# Patient Record
Sex: Female | Born: 1950 | Race: White | Hispanic: No | Smoking: Former smoker
Health system: Southern US, Community
[De-identification: ages and names within clinical notes are randomized; demographics above are authoritative.]

## PROBLEM LIST (undated history)

## (undated) DIAGNOSIS — K2970 Gastritis, unspecified, without bleeding: Secondary | ICD-10-CM

## (undated) DIAGNOSIS — M1990 Unspecified osteoarthritis, unspecified site: Secondary | ICD-10-CM

## (undated) DIAGNOSIS — K2270 Barrett's esophagus without dysplasia: Secondary | ICD-10-CM

## (undated) DIAGNOSIS — K219 Gastro-esophageal reflux disease without esophagitis: Secondary | ICD-10-CM

## (undated) DIAGNOSIS — K279 Peptic ulcer, site unspecified, unspecified as acute or chronic, without hemorrhage or perforation: Secondary | ICD-10-CM

## (undated) DIAGNOSIS — F329 Major depressive disorder, single episode, unspecified: Secondary | ICD-10-CM

## (undated) DIAGNOSIS — F419 Anxiety disorder, unspecified: Secondary | ICD-10-CM

## (undated) DIAGNOSIS — I10 Essential (primary) hypertension: Secondary | ICD-10-CM

## (undated) DIAGNOSIS — K56609 Unspecified intestinal obstruction, unspecified as to partial versus complete obstruction: Secondary | ICD-10-CM

## (undated) DIAGNOSIS — Z8719 Personal history of other diseases of the digestive system: Secondary | ICD-10-CM

## (undated) DIAGNOSIS — K7290 Hepatic failure, unspecified without coma: Secondary | ICD-10-CM

## (undated) DIAGNOSIS — K7689 Other specified diseases of liver: Secondary | ICD-10-CM

## (undated) DIAGNOSIS — G43909 Migraine, unspecified, not intractable, without status migrainosus: Secondary | ICD-10-CM

## (undated) DIAGNOSIS — Z96651 Presence of right artificial knee joint: Secondary | ICD-10-CM

## (undated) DIAGNOSIS — J449 Chronic obstructive pulmonary disease, unspecified: Secondary | ICD-10-CM

## (undated) DIAGNOSIS — F988 Other specified behavioral and emotional disorders with onset usually occurring in childhood and adolescence: Secondary | ICD-10-CM

## (undated) DIAGNOSIS — G4700 Insomnia, unspecified: Secondary | ICD-10-CM

## (undated) DIAGNOSIS — F32A Depression, unspecified: Secondary | ICD-10-CM

## (undated) DIAGNOSIS — F909 Attention-deficit hyperactivity disorder, unspecified type: Secondary | ICD-10-CM

## (undated) DIAGNOSIS — D649 Anemia, unspecified: Secondary | ICD-10-CM

## (undated) DIAGNOSIS — Z8659 Personal history of other mental and behavioral disorders: Secondary | ICD-10-CM

## (undated) DIAGNOSIS — K648 Other hemorrhoids: Secondary | ICD-10-CM

## (undated) DIAGNOSIS — Z8711 Personal history of peptic ulcer disease: Secondary | ICD-10-CM

## (undated) DIAGNOSIS — E2839 Other primary ovarian failure: Secondary | ICD-10-CM

## (undated) HISTORY — DX: Personal history of other diseases of the digestive system: Z87.19

## (undated) HISTORY — DX: Depression, unspecified: F32.A

## (undated) HISTORY — PX: ABDOMINAL HYSTERECTOMY: SHX81

## (undated) HISTORY — DX: Peptic ulcer, site unspecified, unspecified as acute or chronic, without hemorrhage or perforation: K27.9

## (undated) HISTORY — DX: Major depressive disorder, single episode, unspecified: F32.9

## (undated) HISTORY — DX: Hepatic failure, unspecified without coma: K72.90

## (undated) HISTORY — DX: Other hemorrhoids: K64.8

## (undated) HISTORY — DX: Insomnia, unspecified: G47.00

## (undated) HISTORY — DX: Chronic obstructive pulmonary disease, unspecified: J44.9

## (undated) HISTORY — DX: Barrett's esophagus without dysplasia: K22.70

## (undated) HISTORY — DX: Unspecified intestinal obstruction, unspecified as to partial versus complete obstruction: K56.609

## (undated) HISTORY — DX: Attention-deficit hyperactivity disorder, unspecified type: F90.9

## (undated) HISTORY — PX: OOPHORECTOMY: SHX6387

## (undated) HISTORY — PX: DILATION AND CURETTAGE OF UTERUS: SHX78

## (undated) HISTORY — DX: Gastro-esophageal reflux disease without esophagitis: K21.9

## (undated) HISTORY — DX: Personal history of other mental and behavioral disorders: Z86.59

## (undated) HISTORY — PX: LIVER BIOPSY: SHX301

## (undated) HISTORY — DX: Anemia, unspecified: D64.9

## (undated) HISTORY — DX: Essential (primary) hypertension: I10

## (undated) HISTORY — DX: Migraine, unspecified, not intractable, without status migrainosus: G43.909

## (undated) HISTORY — DX: Presence of right artificial knee joint: Z96.651

## (undated) HISTORY — DX: Gastritis, unspecified, without bleeding: K29.70

## (undated) HISTORY — DX: Other primary ovarian failure: E28.39

## (undated) HISTORY — DX: Personal history of peptic ulcer disease: Z87.11

## (undated) HISTORY — DX: Other specified behavioral and emotional disorders with onset usually occurring in childhood and adolescence: F98.8

## (undated) HISTORY — DX: Other specified diseases of liver: K76.89

---

## 1999-01-18 ENCOUNTER — Other Ambulatory Visit: Admission: RE | Admit: 1999-01-18 | Discharge: 1999-01-18 | Payer: Self-pay | Admitting: *Deleted

## 2000-05-11 ENCOUNTER — Inpatient Hospital Stay (HOSPITAL_COMMUNITY): Admission: AD | Admit: 2000-05-11 | Discharge: 2000-05-11 | Payer: Self-pay | Admitting: *Deleted

## 2000-08-19 ENCOUNTER — Inpatient Hospital Stay (HOSPITAL_COMMUNITY): Admission: RE | Admit: 2000-08-19 | Discharge: 2000-08-22 | Payer: Self-pay | Admitting: *Deleted

## 2000-08-19 ENCOUNTER — Encounter (INDEPENDENT_AMBULATORY_CARE_PROVIDER_SITE_OTHER): Payer: Self-pay | Admitting: Specialist

## 2001-05-15 ENCOUNTER — Observation Stay (HOSPITAL_COMMUNITY): Admission: EM | Admit: 2001-05-15 | Discharge: 2001-05-16 | Payer: Self-pay | Admitting: Gastroenterology

## 2001-05-16 ENCOUNTER — Encounter: Payer: Self-pay | Admitting: Internal Medicine

## 2002-09-17 ENCOUNTER — Other Ambulatory Visit: Admission: RE | Admit: 2002-09-17 | Discharge: 2002-09-17 | Payer: Self-pay | Admitting: *Deleted

## 2003-08-19 ENCOUNTER — Other Ambulatory Visit: Admission: RE | Admit: 2003-08-19 | Discharge: 2003-08-19 | Payer: Self-pay | Admitting: *Deleted

## 2004-01-12 ENCOUNTER — Ambulatory Visit (HOSPITAL_COMMUNITY): Admission: RE | Admit: 2004-01-12 | Discharge: 2004-01-12 | Payer: Self-pay | Admitting: Internal Medicine

## 2004-01-12 ENCOUNTER — Encounter (INDEPENDENT_AMBULATORY_CARE_PROVIDER_SITE_OTHER): Payer: Self-pay | Admitting: Specialist

## 2004-01-12 ENCOUNTER — Encounter: Payer: Self-pay | Admitting: Internal Medicine

## 2004-09-20 ENCOUNTER — Ambulatory Visit: Payer: Self-pay | Admitting: Internal Medicine

## 2004-10-13 ENCOUNTER — Emergency Department: Payer: Self-pay | Admitting: Emergency Medicine

## 2004-10-13 ENCOUNTER — Other Ambulatory Visit: Payer: Self-pay

## 2005-08-04 ENCOUNTER — Emergency Department: Payer: Self-pay | Admitting: Emergency Medicine

## 2005-08-04 ENCOUNTER — Other Ambulatory Visit: Payer: Self-pay

## 2005-08-06 ENCOUNTER — Ambulatory Visit: Payer: Self-pay | Admitting: Emergency Medicine

## 2005-09-21 ENCOUNTER — Inpatient Hospital Stay (HOSPITAL_COMMUNITY): Admission: EM | Admit: 2005-09-21 | Discharge: 2005-09-25 | Payer: Self-pay | Admitting: Emergency Medicine

## 2005-09-21 ENCOUNTER — Ambulatory Visit: Payer: Self-pay | Admitting: Internal Medicine

## 2006-01-14 ENCOUNTER — Ambulatory Visit: Payer: Self-pay | Admitting: Internal Medicine

## 2006-07-15 ENCOUNTER — Ambulatory Visit: Payer: Self-pay | Admitting: Internal Medicine

## 2006-07-26 ENCOUNTER — Ambulatory Visit: Payer: Self-pay | Admitting: Urology

## 2007-02-03 ENCOUNTER — Emergency Department: Payer: Self-pay | Admitting: Emergency Medicine

## 2007-05-03 ENCOUNTER — Ambulatory Visit: Payer: Self-pay | Admitting: Orthopaedic Surgery

## 2008-04-07 ENCOUNTER — Ambulatory Visit: Payer: Self-pay | Admitting: Family Medicine

## 2008-04-21 DIAGNOSIS — Z8659 Personal history of other mental and behavioral disorders: Secondary | ICD-10-CM

## 2008-04-21 DIAGNOSIS — Z8719 Personal history of other diseases of the digestive system: Secondary | ICD-10-CM

## 2008-04-21 DIAGNOSIS — K648 Other hemorrhoids: Secondary | ICD-10-CM | POA: Insufficient documentation

## 2008-04-21 DIAGNOSIS — K219 Gastro-esophageal reflux disease without esophagitis: Secondary | ICD-10-CM

## 2008-04-21 DIAGNOSIS — Z8711 Personal history of peptic ulcer disease: Secondary | ICD-10-CM

## 2008-04-21 DIAGNOSIS — Z862 Personal history of diseases of the blood and blood-forming organs and certain disorders involving the immune mechanism: Secondary | ICD-10-CM | POA: Insufficient documentation

## 2008-04-21 DIAGNOSIS — K2270 Barrett's esophagus without dysplasia: Secondary | ICD-10-CM | POA: Insufficient documentation

## 2008-04-21 DIAGNOSIS — I10 Essential (primary) hypertension: Secondary | ICD-10-CM

## 2008-04-21 DIAGNOSIS — G43909 Migraine, unspecified, not intractable, without status migrainosus: Secondary | ICD-10-CM

## 2008-04-21 DIAGNOSIS — J449 Chronic obstructive pulmonary disease, unspecified: Secondary | ICD-10-CM | POA: Insufficient documentation

## 2008-04-21 DIAGNOSIS — K7200 Acute and subacute hepatic failure without coma: Secondary | ICD-10-CM

## 2008-04-21 HISTORY — DX: Personal history of peptic ulcer disease: Z87.11

## 2008-04-21 HISTORY — DX: Personal history of other mental and behavioral disorders: Z86.59

## 2008-04-21 HISTORY — DX: Personal history of other diseases of the digestive system: Z87.19

## 2008-04-22 ENCOUNTER — Ambulatory Visit: Payer: Self-pay | Admitting: Internal Medicine

## 2008-04-22 DIAGNOSIS — K5900 Constipation, unspecified: Secondary | ICD-10-CM | POA: Insufficient documentation

## 2008-04-22 LAB — CONVERTED CEMR LAB
ANA Titer 1: 1:160 {titer} — ABNORMAL HIGH
Anti Nuclear Antibody(ANA): POSITIVE — AB

## 2008-04-26 ENCOUNTER — Telehealth: Payer: Self-pay | Admitting: Internal Medicine

## 2008-04-26 LAB — CONVERTED CEMR LAB
ALT: 19 units/L (ref 0–35)
AST: 19 units/L (ref 0–37)
Albumin: 3.8 g/dL (ref 3.5–5.2)
Alkaline Phosphatase: 64 units/L (ref 39–117)
BUN: 17 mg/dL (ref 6–23)
Basophils Absolute: 0.1 10*3/uL (ref 0.0–0.1)
Basophils Relative: 0.8 % (ref 0.0–1.0)
Bilirubin, Direct: 0.1 mg/dL (ref 0.0–0.3)
CO2: 25 meq/L (ref 19–32)
Calcium: 8.8 mg/dL (ref 8.4–10.5)
Chloride: 107 meq/L (ref 96–112)
Creatinine, Ser: 0.8 mg/dL (ref 0.4–1.2)
Eosinophils Absolute: 0.4 10*3/uL (ref 0.0–0.7)
Eosinophils Relative: 5.4 % — ABNORMAL HIGH (ref 0.0–5.0)
GFR calc Af Amer: 96 mL/min
GFR calc non Af Amer: 79 mL/min
Glucose, Bld: 84 mg/dL (ref 70–99)
HCT: 42.2 % (ref 36.0–46.0)
Hemoglobin: 14.6 g/dL (ref 12.0–15.0)
Iron: 50 ug/dL (ref 42–145)
Lymphocytes Relative: 23.1 % (ref 12.0–46.0)
MCHC: 34.7 g/dL (ref 30.0–36.0)
MCV: 96 fL (ref 78.0–100.0)
Monocytes Absolute: 0.5 10*3/uL (ref 0.1–1.0)
Monocytes Relative: 6.8 % (ref 3.0–12.0)
Neutro Abs: 4.5 10*3/uL (ref 1.4–7.7)
Neutrophils Relative %: 63.9 % (ref 43.0–77.0)
Platelets: 333 10*3/uL (ref 150–400)
Potassium: 3.6 meq/L (ref 3.5–5.1)
RBC: 4.4 M/uL (ref 3.87–5.11)
RDW: 12.9 % (ref 11.5–14.6)
Saturation Ratios: 15.2 % — ABNORMAL LOW (ref 20.0–50.0)
Sodium: 142 meq/L (ref 135–145)
TSH: 2.41 microintl units/mL (ref 0.35–5.50)
Total Bilirubin: 0.6 mg/dL (ref 0.3–1.2)
Total Protein: 6.9 g/dL (ref 6.0–8.3)
Transferrin: 235 mg/dL (ref >=212.0)
WBC: 7.1 10*3/uL (ref 4.5–10.5)

## 2008-05-05 ENCOUNTER — Telehealth: Payer: Self-pay | Admitting: Internal Medicine

## 2008-05-12 ENCOUNTER — Ambulatory Visit: Payer: Self-pay | Admitting: Internal Medicine

## 2008-05-13 LAB — CONVERTED CEMR LAB
Fecal Occult Blood: NEGATIVE
OCCULT 1: NEGATIVE
OCCULT 2: NEGATIVE
OCCULT 3: NEGATIVE
OCCULT 4: NEGATIVE
OCCULT 5: NEGATIVE

## 2008-06-21 ENCOUNTER — Telehealth: Payer: Self-pay | Admitting: Internal Medicine

## 2008-07-29 ENCOUNTER — Telehealth: Payer: Self-pay | Admitting: Internal Medicine

## 2008-09-06 ENCOUNTER — Telehealth: Payer: Self-pay | Admitting: Internal Medicine

## 2008-10-06 ENCOUNTER — Telehealth: Payer: Self-pay | Admitting: Internal Medicine

## 2008-10-07 ENCOUNTER — Telehealth: Payer: Self-pay | Admitting: Internal Medicine

## 2008-10-15 ENCOUNTER — Encounter: Payer: Self-pay | Admitting: Internal Medicine

## 2008-10-15 ENCOUNTER — Ambulatory Visit: Payer: Self-pay | Admitting: Internal Medicine

## 2008-10-19 ENCOUNTER — Encounter: Payer: Self-pay | Admitting: Internal Medicine

## 2008-10-20 ENCOUNTER — Telehealth: Payer: Self-pay | Admitting: Internal Medicine

## 2008-12-13 ENCOUNTER — Telehealth: Payer: Self-pay | Admitting: Internal Medicine

## 2009-02-09 ENCOUNTER — Telehealth: Payer: Self-pay | Admitting: Internal Medicine

## 2009-03-01 ENCOUNTER — Encounter: Payer: Self-pay | Admitting: Internal Medicine

## 2009-03-17 ENCOUNTER — Ambulatory Visit: Payer: Self-pay | Admitting: Internal Medicine

## 2009-07-13 ENCOUNTER — Telehealth: Payer: Self-pay | Admitting: Internal Medicine

## 2009-08-30 ENCOUNTER — Telehealth: Payer: Self-pay | Admitting: Internal Medicine

## 2009-09-08 ENCOUNTER — Telehealth: Payer: Self-pay | Admitting: Internal Medicine

## 2009-10-13 ENCOUNTER — Telehealth: Payer: Self-pay | Admitting: Internal Medicine

## 2009-11-30 ENCOUNTER — Ambulatory Visit: Payer: Self-pay | Admitting: Internal Medicine

## 2009-11-30 LAB — CONVERTED CEMR LAB
ANA Titer 1: 1:640 {titer} — ABNORMAL HIGH
Anti Nuclear Antibody(ANA): POSITIVE — AB

## 2009-12-01 ENCOUNTER — Telehealth: Payer: Self-pay | Admitting: Internal Medicine

## 2009-12-01 LAB — CONVERTED CEMR LAB
ALT: 16 units/L (ref 0–35)
AST: 24 units/L (ref 0–37)
Albumin: 4.1 g/dL (ref 3.5–5.2)
Alkaline Phosphatase: 68 units/L (ref 39–117)
BUN: 14 mg/dL (ref 6–23)
Basophils Absolute: 0 10*3/uL (ref 0.0–0.1)
Basophils Relative: 0.1 % (ref 0.0–3.0)
CO2: 28 meq/L (ref 19–32)
Calcium: 9.3 mg/dL (ref 8.4–10.5)
Chloride: 107 meq/L (ref 96–112)
Creatinine, Ser: 0.7 mg/dL (ref 0.4–1.2)
Eosinophils Absolute: 0.3 10*3/uL (ref 0.0–0.7)
Eosinophils Relative: 5.9 % — ABNORMAL HIGH (ref 0.0–5.0)
GFR calc non Af Amer: 91.83 mL/min (ref >60)
Glucose, Bld: 90 mg/dL (ref 70–99)
HCT: 45.7 % (ref 36.0–46.0)
Hemoglobin: 15 g/dL (ref 12.0–15.0)
Lymphocytes Relative: 26.7 % (ref 12.0–46.0)
Lymphs Abs: 1.3 10*3/uL (ref 0.7–4.0)
MCHC: 32.7 g/dL (ref 30.0–36.0)
MCV: 99.5 fL (ref 78.0–100.0)
Monocytes Absolute: 0.3 10*3/uL (ref 0.1–1.0)
Monocytes Relative: 5.2 % (ref 3.0–12.0)
Neutro Abs: 3.1 10*3/uL (ref 1.4–7.7)
Neutrophils Relative %: 62.1 % (ref 43.0–77.0)
Platelets: 285 10*3/uL (ref 150.0–400.0)
Potassium: 4.8 meq/L (ref 3.5–5.1)
RBC: 4.59 M/uL (ref 3.87–5.11)
RDW: 13.1 % (ref 11.5–14.6)
Sed Rate: 7 mm/hr (ref 0–22)
Sodium: 142 meq/L (ref 135–145)
TSH: 1.5 microintl units/mL (ref 0.35–5.50)
Total Bilirubin: 0.5 mg/dL (ref 0.3–1.2)
Total Protein: 7.6 g/dL (ref 6.0–8.3)
WBC: 5 10*3/uL (ref 4.5–10.5)

## 2010-02-17 ENCOUNTER — Ambulatory Visit: Payer: Self-pay | Admitting: Family Medicine

## 2010-03-16 ENCOUNTER — Other Ambulatory Visit: Payer: Self-pay | Admitting: Family Medicine

## 2010-03-16 DIAGNOSIS — F900 Attention-deficit hyperactivity disorder, predominantly inattentive type: Secondary | ICD-10-CM | POA: Insufficient documentation

## 2010-05-02 ENCOUNTER — Ambulatory Visit: Payer: Self-pay | Admitting: Internal Medicine

## 2010-05-02 ENCOUNTER — Telehealth: Payer: Self-pay | Admitting: Internal Medicine

## 2010-05-03 LAB — CONVERTED CEMR LAB
ALT: 19 units/L (ref 0–35)
AST: 24 units/L (ref 0–37)
Albumin: 4.1 g/dL (ref 3.5–5.2)
Alkaline Phosphatase: 72 units/L (ref 39–117)
BUN: 17 mg/dL (ref 6–23)
Basophils Absolute: 0 10*3/uL (ref 0.0–0.1)
Basophils Relative: 0.6 % (ref 0.0–3.0)
CO2: 31 meq/L (ref 19–32)
Calcium: 9.3 mg/dL (ref 8.4–10.5)
Chloride: 106 meq/L (ref 96–112)
Creatinine, Ser: 0.5 mg/dL (ref 0.4–1.2)
Eosinophils Absolute: 0.1 10*3/uL (ref 0.0–0.7)
Eosinophils Relative: 2.6 % (ref 0.0–5.0)
GFR calc non Af Amer: 132.15 mL/min (ref >60)
Glucose, Bld: 89 mg/dL (ref 70–99)
HCT: 41.2 % (ref 36.0–46.0)
Hemoglobin: 14.3 g/dL (ref 12.0–15.0)
Lymphocytes Relative: 23.2 % (ref 12.0–46.0)
Lymphs Abs: 1.2 10*3/uL (ref 0.7–4.0)
MCHC: 34.6 g/dL (ref 30.0–36.0)
MCV: 96.8 fL (ref 78.0–100.0)
Monocytes Absolute: 0.4 10*3/uL (ref 0.1–1.0)
Monocytes Relative: 7.5 % (ref 3.0–12.0)
Neutro Abs: 3.3 10*3/uL (ref 1.4–7.7)
Neutrophils Relative %: 66.1 % (ref 43.0–77.0)
Platelets: 271 10*3/uL (ref 150.0–400.0)
Potassium: 5 meq/L (ref 3.5–5.1)
RBC: 4.26 M/uL (ref 3.87–5.11)
RDW: 13.8 % (ref 11.5–14.6)
Sodium: 144 meq/L (ref 135–145)
Total Bilirubin: 0.5 mg/dL (ref 0.3–1.2)
Total Protein: 7.5 g/dL (ref 6.0–8.3)
WBC: 5 10*3/uL (ref 4.5–10.5)

## 2010-05-25 ENCOUNTER — Ambulatory Visit: Payer: Self-pay | Admitting: Family Medicine

## 2010-08-02 ENCOUNTER — Other Ambulatory Visit: Payer: Self-pay | Admitting: Family Medicine

## 2010-12-05 NOTE — Progress Notes (Signed)
Summary: Med for knee pain  Medications Added VOLTAREN 1 % GEL (DICLOFENAC SODIUM) Apply to knee 2 times daily as needed       Phone Note Call from Patient   Caller: Patient Call For: Dr.Feliciano Wynter Summary of Call: Pt. wants Dr.Hasina Kreager to write her an order for Voltaren Gel for her knee pain, states she discussed this with Dr.Neema Fluegge at her appt. yesterday.  DR.Felita Bump PLEASE ADVISE  Initial call taken by: Laureen Ochs LPN,  December 01, 2009 9:07 AM  Follow-up for Phone Call        Yes, I forgot to prescribe it. Voltaren gel 1%, 1 tube, aply two times a day to affected area, 1 refill Follow-up by: Hart Carwin MD,  December 01, 2009 1:09 PM  Additional Follow-up for Phone Call Additional follow up Details #1::        Message left for pt.-Script has been sent to her pharmacy. Pt. instructed to call back as needed.  Additional Follow-up by: Laureen Ochs LPN,  December 01, 2009 1:39 PM    New/Updated Medications: VOLTAREN 1 % GEL (DICLOFENAC SODIUM) Apply to knee 2 times daily as needed Prescriptions: VOLTAREN 1 % GEL (DICLOFENAC SODIUM) Apply to knee 2 times daily as needed  #1 tube x 1   Entered by:   Laureen Ochs LPN   Authorized by:   Hart Carwin MD   Signed by:   Laureen Ochs LPN on 81/11/7508   Method used:   Electronically to        [facility] [phone]* (retail)       [address] [place], [place]  [address]       Ph: [phone] or [phone]       Fax: [phone]   RxID:   [id number]

## 2010-12-05 NOTE — Progress Notes (Signed)
Summary: Fastin   ---- Converted from flag ----  ---- 05/01/2010 8:33 AM, Lamona Curl CMA (AAMA) wrote: Dr Cresenciano Genre requests refills on her fastin....she has now been taking the medication for at least 6 months...is she okay to get refills as long as she is losing weight or do you need to see her in the office or get labs? ------------------------------  ---- 05/01/2010 5:39 PM, Hart Carwin MD wrote: She ought to get CBC, C-met, and record her weight, please. Refill for 6 months.   Phone Note Outgoing Call   Call placed by: Lamona Curl CMA Duncan Dull),  May 02, 2010 8:53 AM Call placed to: Patient Summary of Call: I have left a message for patient to call back. I have also entered labs into [facility] and will advise patient of this when she calls back. Initial call taken by: Lamona Curl CMA Duncan Dull),  May 02, 2010 8:54 AM  Follow-up for Phone Call        Patient will come in for labs this week. She will also call us with a recorded weight. I have sent a 6 month refill for patient. Follow-up by: Lamona Curl CMA Duncan Dull),  May 02, 2010 10:44 AM    Prescriptions: FASTIN 30 MG Take 1 tablet by mouth once a day.  #30 x 5   Entered by:   Lamona Curl CMA (AAMA)   Authorized by:   Hart Carwin MD   Signed by:   Lamona Curl CMA (AAMA) on 05/02/2010   Method used:   Printed then faxed to ...       [facility] [phone]* (retail)       [address] [place], [place]  [address]       Ph: [phone] or [phone]       Fax: [phone]   RxID:   [id number]

## 2010-12-05 NOTE — Assessment & Plan Note (Signed)
Summary: MEDICATION REFILL/F/U--CH.    History of Present Illness Visit Type: Follow-up Visit Primary GI MD: Lina Sar MD Primary Provider: Dennison Mascot, M.D. Requesting Provider: Dennison Mascot, MD Chief Complaint: Pt needs a refill on Fastin and wants to discuss ECL. Pt denies any other sx. History of Present Illness:   This is a 60 year old white female with a history of autoimmune liver disease and liver failure. She was also found to have Barrett's esophagus on an upper endoscopy in 2005 and again in 2009. She is due for a colonoscopy in July 2012. Patient is also due for an endoscopy in July 2002. She comes today for a routine follow up and refills of her medications. She denies any specific GI symptoms other than severe constipation for which she takes MiraLax 17 g daily. Patient would like refills on Fastin with which she has successfully been losing weight.   GI Review of Systems      Denies abdominal pain, acid reflux, belching, bloating, chest pain, dysphagia with liquids, dysphagia with solids, heartburn, loss of appetite, nausea, vomiting, vomiting blood, weight loss, and  weight gain.        Denies anal fissure, black tarry stools, change in bowel habit, constipation, diarrhea, diverticulosis, fecal incontinence, heme positive stool, hemorrhoids, irritable bowel syndrome, jaundice, light color stool, liver problems, rectal bleeding, and  rectal pain.    Current Medications (verified): 1)  Protonix 40 Mg  Tbec (Pantoprazole Sodium) .... Take 1 Tablet By Mouth Two Times A Day 2)  Atenolol 50 Mg  Tabs (Atenolol) .Marland Kitchen.. 1 Tablet Once Daily 3)  Ambien 5 Mg  Tabs (Zolpidem Tartrate) .Marland Kitchen.. 1 Tablet At Bedtime 4)  Miralax   Powd (Polyethylene Glycol 3350) .... Take 1 Capful Dissolved in Water Once Daily. 5)  Fastin 30 Mg .... Take 1 Tablet By Mouth Once A Day. 6)  Ferrous Sulfate 325 (65 Fe) Mg  Tabs (Ferrous Sulfate) .... Take One Every Night At Bedtime 7)  Imitrex 100 Mg Tabs  (Sumatriptan Succinate) .... 1/2 Tablet As Needed For Migrains  Allergies (verified): No Known Drug Allergies  Past History:  Past Medical History: Reviewed history from 04/21/2008 and no changes required. 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 D & C hysterectomy  unilateral oophorectomy  Family History: Reviewed history from 04/22/2008 and no changes required. Family History of Heart Disease: CHF father No FH of Colon Cancer:  Social History: Reviewed history from 04/22/2008 and no changes required. Daily Caffeine Use-1 drink daily Illicit Drug Use - no Patient is a former smoker. -stopped 1990 Alcohol Use - yes-1 glass of wine on weekends  Review of Systems  The patient denies allergy/sinus, anemia, anxiety-new, arthritis/joint pain, back pain, blood in urine, breast changes/lumps, change in vision, confusion, cough, coughing up blood, depression-new, fainting, fatigue, fever, headaches-new, hearing problems, heart murmur, heart rhythm changes, itching, menstrual pain, muscle pains/cramps, night sweats, nosebleeds, pregnancy symptoms, shortness of breath, skin rash, sleeping problems, sore throat, swelling of feet/legs, swollen lymph glands, thirst - excessive , urination - excessive , urination changes/pain, urine leakage, vision changes, and voice change.         Pertinent positive and negative review of systems were noted  in the above HPI. All other ROS was otherwise negative.   Vital Signs:  Patient profile:   60 year old female Height:      64  inches Weight:      143.50 pounds BMI:     24.72 Pulse rate:   66 / minute Pulse rhythm:   regular BP sitting:   146 / 78  (right arm) Cuff size:   regular  Vitals Entered By: Christie Nottingham CMA Duncan Dull) (November 30, 2009 9:49 AM)  Physical Exam  General:  Well developed, well nourished, no acute distress. Eyes:  PERRLA, no icterus. Mouth:  No deformity or lesions, dentition normal. Neck:  Supple; no masses or thyromegaly. Lungs:  Clear throughout to auscultation. Heart:  Regular rate and rhythm; no murmurs, rubs,  or bruits. Abdomen:  Soft, nontender and nondistended. No masses, hepatosplenomegaly or hernias noted. Normal bowel sounds. Extremities:  No clubbing, cyanosis, edema or deformities noted. Skin:  Intact without significant lesions or rashes. Psych:  Alert and cooperative. Normal mood and affect.   Impression & Recommendations:  Problem # 1:  Hx of HEPATIC FAILURE (ICD-570) history of fulminant  hepatic failure in 1990 , s/p  complete resolution. Repeat liver function tests today as well as ANA titer, CBC and sedimentation rate  Problem # 2:  DEPRESSION, HX OF (ICD-V11.8) refill Fastin  30 mg daily,  suspect ADHD disorder;  patient seems to be better focused when taking  medication  Problem # 3:  BARRETTS ESOPHAGUS (ICD-530.85) recall upper endoscopy July 2012 Orders: T-ANA ([address]-60454) TLB-CBC Platelet - w/Differential (85025-CBCD) TLB-CMP (Comprehensive Metabolic Pnl) (80053-COMP) TLB-TSH (Thyroid Stimulating Hormone) (84443-TSH) TLB-Sedimentation Rate (ESR) (85652-ESR)  Problem # 4:  INTERNAL HEMORRHOIDS (ICD-455.0) not symptomatic. Recall colonoscopy July 2012  Patient Instructions: 1)  refill Fastin 30 mg p.o. q.d. 2)  Continue MiraLax p.r.n. 3)  Recall upper endoscopy and colonoscopy July 2012 4)  Labs today 5)  Copy sent to : Dr Dennison Mascot Prescriptions: FASTIN 30 MG Take 1 tablet by mouth once a day.  #30 x 6   Entered by:   Hortense Ramal CMA  (AAMA)   Authorized by:   Hart Carwin MD   Signed by:   Hortense Ramal CMA (AAMA) on 11/30/2009   Method used:   Handwritten   RxID:   [id number]

## 2011-03-23 NOTE — Discharge Summary (Signed)
NAMESOPHEAP, BASIC               ACCOUNT NO.:  [id number]   MEDICAL RECORD NO.:  [id number]          PATIENT TYPE:  INP   LOCATION:  1401                         FACILITY:  [facility]   PHYSICIAN:  Leonie Man, M.D.   DATE OF BIRTH:  09-04-1951   DATE OF ADMISSION:  09/20/2005  DATE OF DISCHARGE:  09/25/2005                                 DISCHARGE SUMMARY   ADMISSION DIAGNOSES:  1.  Partial small bowel obstruction.  2.  History of autoimmune liver failure.  3.  History of migraine headaches.  4.  Hypertension.  5.  Barrett's esophagus.  6.  Chronic constipation.   DISCHARGE DIAGNOSES:  1.  Partial small bowel obstruction.  2.  History of autoimmune liver failure.  3.  History of migraine headaches.  4.  Hypertension.  5.  Barrett's esophagus.  6.  Chronic constipation.   CONDITION ON DISCHARGE:  Improved.   MEDICATIONS ON DISCHARGE:  None   HISTORY OF PRESENT ILLNESS:  The patient is a 60 year old female admitted  with a 2-day history of severe cramping abdominal pain associated with some  nausea. Her pain persisted throughout the day but she did have a small bowel  movement on Wednesday and noted a small amount of blood in her stool. She  had not had any passing flatus since that time. She was admitted through the  emergency room and a KUB was originally read as normal but because of  persistent pain, she had a CT scan of the abdomen which showed dilated loops  of small bowel with bowel wall thickening with the possibility of an  inflammatory versus adhesive small bowel obstruction. The patient was  admitted and put on bowel rest. Over the ensuing 24 hours, she did begin to  feel better as far as her abdominal symptoms were concerned and she did pass  some small amounts of flatus. She, however developed a migraine headache  while in the hospital and her discharge was delayed because of this. Her  migraine resolved over the next at 24 hours and she then underwent a  small-  bowel series which was read as normal with no evidence of adhesions or  intussusception on the study. Her diet was resumed and she tolerated this  well and she was discharged from the hospital on 09/25/2005. Her follow-up  is scheduled for her to return to our office in approximately 3 weeks or  earlier if her symptoms recur.   ACTIVITY:  Is as tolerated.   DIET:  Is unrestricted.      Leonie Man, M.D.  Electronically Signed     PB/MEDQ  D:  11/01/2005  T:  11/01/2005  Job:  [id number]   cc:   Leonie Man, M.D.  [address]  [address]  [place]  [place] [address]

## 2011-03-23 NOTE — H&P (Signed)
NAME:  MITSUE, PEERY                         ACCOUNT NO.:  [id number]   MEDICAL RECORD NO.:  [id number]                   PATIENT TYPE:  AMB   LOCATION:  ENDO                                 FACILITY:  [facility]   PHYSICIAN:  Lina Sar, M.D. [facility]               DATE OF BIRTH:  27-Dec-1950   DATE OF ADMISSION:  01/12/2004  DATE OF DISCHARGE:                                HISTORY & PHYSICAL   Audio too short to transcribe (less than 5 seconds)                                                Lina Sar, M.D. [facility]    DB/MEDQ  D:  01/12/2004  T:  01/12/2004  Job:  [id number]
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
## 2011-03-23 NOTE — Assessment & Plan Note (Signed)
[facility]                           GASTROENTEROLOGY OFFICE NOTE   NAME:Cranfield, NIAYA HICKOK                      MRN:          [id number]  DATE:07/15/2006                            DOB:          1951-08-20    Ms. Wardle is a 60 year old white female with autoimmune disease positive  for ANA titer of 2500, history of Barrett's esophagus documented on last  upper endoscopy in March 2005.  She had fulminant hepatic failure in 1990,  from which she has fully recovered.  She also has a history of rectal  bleeding, acute lower GI bleeding in July 2005.  Colonoscopy at that time  showed internal hemorrhoids.  She is here today to recheck on her  medications and her blood tests.  Last appointment in March 2007   MEDICATIONS:  1. Protonix 40 mg p.o. b.i.d.  2. Spironolactone 25 mg p.o. daily.  3. Macrobid one p.o. b.i.d.  4. She is also continued with doxepin 75 mg p.o. daily.  5. Zomig 2.5 mg p.r.n.   PHYSICAL EXAMINATION:  VITAL SIGNS:  Blood pressure 124/82, pulse 60, weight  147 pounds.  GENERAL:  She was alert and oriented, in no distress.  LUNGS:  Clear to auscultation.  CARDIAC:  Normal S1, normal S2.  ABDOMEN:  Soft.  Liver edge at costal margin.  By percussion, liver span  about 9 cm.  Lower abdomen was normal.  No distention.  Normoactive bowel  sounds.  EXTREMITIES:  No edema.   IMPRESSION:  1. Stable gastroesophageal reflux disease in Barrett's esophagus.  2. Recent lower abdominal pain/urinary tract infection, being treated.  3. Patient on __________  for weight loss and decreased appetite.  4. Migraine headaches.   PLAN:  1. Refill on __________ , Zomig.  2. CBC, ANA titer, anti-DNA, CMET and sedimentation rate today.  3. Recall upper endoscopy for Barrett's esophagus next few months.                                   Hedwig Morton. Juanda Chance, MD   DMB/MedQ  DD:  07/15/2006  DT:  07/16/2006  Job #:  [id number]   cc:   Wonda Cheng

## 2011-03-23 NOTE — H&P (Signed)
[facility]  Patient:    Ashley [name], Ashley [name]                        MRN: [id number] Adm. Date:  05/15/01 Attending:  Barbette Hair. Arlyce Dice, M.D. [facility] Dictator:   Sammuel Cooper, P.A.                         History and Physical  DATE OF BIRTH:  [birth year]  CHIEF COMPLAINT:  Rectal bleeding, bright red blood onset today.  HISTORY OF PRESENT ILLNESS:  The patient is a very nice 60 year old white female followed by D.r Lina Sar, who has a history of irritable bowel with chronic constipation and an underlying collagen vascular disease, type unclear.  The patient had a history of fulminant hepatitis and hepatic failure in 1990 which was felt secondary to her autoimmune disease versus Tylenol. Apparently she had a prolonged hospitalization with severe liver failure requiring ventilator support, etc.  She recuperated from that fully and has been doing well.  She is otherwise in generally good health at this point. She had undergone a hysterectomy for fibroids in October 2001 and did well with that.  The patient does have chronic constipation which she manages with Senokot with good success.  She has not had prior colon evaluation as yet. She does have some difficulty with chronic arthralgias and has been using some Celebrex and Aleve on a p.r.n. basis.  At this time the patient had acute onset today with painless bright red blood per rectum.  She said she had a normal bowel movements earlier this morning and then a few hours later had some urgency and then passed normal-appearing stool, but a commode full of blood and clots.  This was not associated with any abdominal pain or cramping.  Shortly thereafter she had a second episode and then called our office.  She is now seen and evaluated in the office.  She has no complaints of lightheadedness or dizziness, no fevers or chills, no abdominal pain, cramping, nausea or vomiting, etc.  Physical examination revealed  she was hemodynamically stable with pulse in 80s, blood pressure 142/80, and respirations 16.  Abdomen was benign.  Rectal showed no stool in vault, but mucus is grossly positive for blood.  She is admitted at this time for observation and plans for colonoscopy. Suspect lower gastrointestinal bleed, i.e, diverticular.  CURRENT MEDICATIONS: 1. Sarafem 20 mg p.o. q.d. 2. Senokot 1 p.o. q.d. 3. Celebrex 100 q.d. 4. Nexium 40 p.r.n. 5. Temazepam 30 q.h.s. 6. Diazepam 5 mg b.i.d. p.r.n. 7. Aleve p.r.n.  ALLERGIES:  No known drug allergies.  PAST MEDICAL HISTORY:  As outlined above. 1. She is status post hysterectomy and unilateral oophorectomy in October 2001    done for fibroids. 2. Irritable bowel syndrome with chronic constipation. 3. Arthralgias probably secondary to underlying collagen vascular disease. 4. Anxiety. 5. History of fulminant hepatitis and hepatic failure in 1990.  FAMILY HISTORY:  Negative for colon cancer, polyps, inflammatory bowel disease.  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  No tobacco and no ETOH.  REVIEW OF SYSTEMS:  CARDIOVASCULAR:  Negative for chest pain or anginal symptoms.  PULMONARY:  Negative for cough, shortness of breath, or sputum production.  GENITOURINARY:  Negative.  MUSCULOSKELETAL:  Pertinent for chronic arthralgias.  PHYSICAL EXAMINATION:  VITAL SIGNS:  As outlined above.  GENERAL:  Well-developed, white  female in no acute distress.  She is alert and oriented x 3.  HEENT:  Normocephalic, atraumatic.  EOMI.  PERRLA.  Sclerae anicteric.  CARDIOVASCULAR:  Regular rate and rhythm with S1/S2, no murmur, rub or gallop.  PULMONARY:  Clear to auscultation and percussion.  ABDOMEN:  Soft and nontender.  There is no mass or hepatosplenomegaly.  Bowel sounds are active.  RECTAL:  No stool in the vault currently.  Mucus is 4+ positive for occult blood.  There is no mass, fissure,  etc.  EXTREMITIES:  No clubbing, cyanosis, or edema.  IMPRESSION: 1. A 60 year old white female with acute lower gastrointestinal bleed,    etiology not clear, painless, rule out diverticular, occult, colon lesion,    Meckel, etc. 2. History of autoimmune disorder, type unclear, question lupus variant. 3. History of fulminant hepatitis and hepatic failure, 1990, felt secondary to    autoimmune disease. 4. Chronic constipation/irritable bowel syndrome. 5. Arthralgias.  PLAN:  The patient is admitted to the service of Dr. Melvia Heaps for intravenous fluid hydration, serial H&H, transfusions as needed, bowel rest, and will cover her with PPI and planned colonoscopy tomorrow.  For details please see the orders. DD:  05/15/01 TD:  05/15/01 Job: [id number]

## 2011-03-23 NOTE — Discharge Summary (Signed)
[facility]  Patient:    Ashley [name], Ashley [name]                      MRN: [id number] Adm. Date:  [id number] Disc. Date: 08/22/00 Attending:  Pleas Koch                           Discharge Summary  ADMISSION DIAGNOSIS:          Symptomatic fibroid uterus.  DISCHARGE DIAGNOSIS:          Symptomatic fibroid uterus.  BRIEF HISTORY:                This is a 60 year old white female with symptomatic fibroid uterus.  She was admitted for a total abdominal hysterectomy.  Please see history and physical for details.  HOSPITAL COURSE:              The patient underwent a total abdominal hysterectomy and right salpingo-oophorectomy because of extensive adhesions involving the right tube and ovary. The left tube and ovary were left in situ because they were undamaged. The patient did well postoperatively.  She had an Alpha-200 pain management catheter placed.  She had a postoperative headache and this was managed with analgesics.  It was thought that this was due to estrogen withdrawal and an estrogen patch was placed with a significant improvement of her headache.  Her catheter was removed on the first day and she voided with no difficulty.  Her diet was advanced and positive flatus. She was taking pain medicine by mouth, Fioricet and Motrin.  She felt well and was passing gas and was not distended by the third day.  Hemoglobin postoperatively was 10.6 and the preoperative hemoglobin was 13.3.  The patient was doing well. She was discharged and staples were removed.  DISCHARGE MEDICATIONS:        She was given prescriptions for Fioricet and Motrin and estrogen patch at 0.1 mg to change twice weekly.  FOLLOW-UP:                    She will follow up in the office in two weeks. DISCHARGE INSTRUCTIONS:       She was given detailed discharge instructions to watch for heavy vaginal bleeding, wound infection, severe pain or fever. DD:  08/22/00 TD:   08/22/00 Job: [id number]

## 2011-03-23 NOTE — Op Note (Signed)
NAME:  Ashley [name], Ashley [name]                         ACCOUNT NO.:  [id number]   MEDICAL RECORD NO.:  [id number]                   PATIENT TYPE:  AMB   LOCATION:  ENDO                                 FACILITY:  [facility]   PHYSICIAN:  Lina Sar, M.D. [facility]               DATE OF BIRTH:  February 13, 1951   DATE OF PROCEDURE:  01/12/2004  DATE OF DISCHARGE:                                 OPERATIVE REPORT   PROCEDURE:  Upper endoscopy.   INDICATIONS:  This 60 year old tall white female has had chronic  intermittent gastroesophageal reflux for which she has taken proton pump  inhibitors.  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  She has increased her Protonix to 40 mg p.o. b.i.d.  without noticeable improvement of her hoarseness.  She has cut back on the  Protonix to 40 mg a day, and she has done well in the last several weeks.  She is undergoing upper endoscopy to assess her for structural abnormalities  of her upper GI tract.   ENDOSCOPE:  Fujinon single-channel video endoscope.   SEDATION:  Versed 10 mg IV, fentanyl 100 mcg IV.   FINDINGS:  Fujinon single-channel video endoscope passed under direct vision  through posterior pharynx into the esophagus.  The patient was monitored by  pulse oximetry.  Her oxygen saturations were normal.  Valleculae and  piriform sinuses were normal.  Proximate and distal esophageal mucosa was  unremarkable.  At the squamocolumnar junction, Z line appears normal.  Multiple biopsies were taken to assess for chronic gastroesophageal reflux.   Stomach:  Stomach was insufflated and showed normal-appearing gastric  mucosa, antrum, as well as the body of the stomach.  Retroflexion of the  endoscope revealed normal fundus and cardia.  As the endoscope traversed  into the prepyloric antrum, the mucosa appeared somewhat reticulated and  erythematous.  Biopsies  were taken for CLOtest.   Duodenum:  Duodenal bulb and descending duodenum were normal.   Endoscope was then retracted in stomach and stomach decompressed.  The  patient tolerated the procedure well.   IMPRESSION:  1. Essentially normal upper endoscopy of esophagus, stomach, and duodenum,     status post CLOtest from the gastric antrum.  2. Status post gastroesophageal junction biopsies to rule out Barrett's     esophagus.   PLAN:  The patient is to continue Protonix 40 mg a day.  If her symptoms of  hoarseness return, I would consider a 24-hour esophageal pH probe.                                               Lina Sar, M.D. [facility]    DB/MEDQ  D:  01/12/2004  T:  01/13/2004  Job:  [id number]

## 2011-03-23 NOTE — Op Note (Signed)
[facility]  Patient:    Ashley [name], Ashley [name]                      MRN: [id number] Proc. Date: 08/19/00 Adm. Date:  [id number] Attending:  Pleas Koch                           Operative Report  PREOPERATIVE DIAGNOSIS:           Fibroid uterus, symptomatic.  POSTOPERATIVE DIAGNOSIS:          Fibroid uterus, symptomatic plus right tubo-ovarian adhesions.  OPERATION:                        Total abdominal hysterectomy and right salpingo-oophorectomy.  SURGEON:                          Georgina Peer, M.D.  ASSISTANT:                        Henreitta Leber, PA-C  ANESTHESIA:                       General.  ESTIMATED BLOOD LOSS:             200 cc.  COMPLICATIONS:                    None.  OPERATIVE FINDINGS:               Fibroid uterus, adhesions of the right ovary to the right uterosacral ligament and pelvic side wall.  Left ovary normal.  INDICATIONS:                      The patient is a 60 year old gravida 0 with a known fibroid uterus, pain and pressure.  She was brought in for a total abdominal hysterectomy.  For complete details, see history and physical.  DESCRIPTION OF PROCEDURE:         The patient was taken to the operating room and given a general anesthetic and placed in dorsal supine position after being prepped and draped and Foley catheter placed in the bladder.  A transverse incision was made in the skin and incision taken into the peritoneal cavity in the normal Pfannenstiel manner.  Retractors and packs were placed and the patient placed in Trendelenburg.  A bladder flap was placed.  There was a small omental adhesion to the right pelvic side wall. The right tube and ovary were densely adherent to the right uterosacral ligament and right ovarian fossa.  The left ovary and tube were freely mobile. The round ligaments bilaterally were clamped, cut and suture ligated.  The utero-ovarian ligaments bilaterally were clamped,  cut and suture ligated. Bladder flap anteriorly was made and the bladder was pushed off the anterior uterine segment.  The uterine vessels were skeletonized and the uterine vessels bilaterally clamped, cut and suture ligated.  The bladder was progressively pushed off the cervix.  The uterosacral ligaments were bilaterally clamped, cut and suture ligated and the vagina entered at the cervicovaginal apex.  Corner clamps were placed and suture ligated.  The cervix was removed from the vaginal cuff in its entirety.  Corner sutures were placed with Vicryl in the midportion, whip-stitched and an interrupted suture placed to close the cuff.  There was excellent hemostasis.  The right ovary was then dissected free from the cul-de-sac and ovarian fossa by sharp and blunt dissection.  The ureter was identified lateral to this dissection plane. The infundibulopelvic vessels were identified, clamped, cut and suture ligated.  There was excellent hemostasis.  The left ovary was suspended to the left round ligament.  The pelvis was irrigated.  Retractors and packs were removed.  The muscles on the underside of the fascia were inspected and there was excellent hemostasis.  The fascia was closed with Vicryl suture.  An Alpha 200 catheter system was placed three fingerbreadths above and to the right of the incision through a separate insertion.  20 cc of 0.25% Marcaine was injected into the subcutaneous tissues.  The skin was closed with skin staples.  Sponge and instrument counts were correct.  The patient was transferred to the recovery area in stable condition, accompanied by the anesthesiologist. DD:  08/19/00 TD:  08/19/00 Job: [id number]

## 2011-03-23 NOTE — H&P (Signed)
[name], [name]               ACCOUNT NO.:  [id number]   MEDICAL RECORD NO.:  [id number]          PATIENT TYPE:  INP   LOCATION:  0102                         FACILITY:  [facility]   PHYSICIAN:  Leonie Man, M.D.   DATE OF BIRTH:  1951-10-01   DATE OF ADMISSION:  09/20/2005  DATE OF DISCHARGE:                                HISTORY & PHYSICAL   PROBLEM:  Small-bowel obstruction.   SYMPTOMS:  Abdominal pain; no nausea, vomiting, or diarrhea.   PRESENT ILLNESS:  The patient is a 60 year old female who 2 days ago noted  the onset of severe cramping abdominal pain with associated mild nausea. The  pain persisted throughout the day and into Thursday. She had a small bowel  movement on Wednesday p.m. and noted a small amount of blood in her stool.  She has not had any flatus since that time. Because of her persistent pain  she comes to the emergency room on Thursday and seen in the ER. A KUB and  chest x-ray were read as normal. Because of her persistent abdominal pain,  CT scan was done and she showed dilated small bowel segments with small  bowel wall thickening, read as possibly inflammatory versus adhesive small-  bowel obstruction. On review of the CT there is also a small area within the  proximal jejunum which may represent either introsusception or a small bowel  tumor.   PAST MEDICAL HISTORY:  Surgical history:  1.  Status post total abdominal hysterectomy.  2.  Bilateral tubal ligation.  3.  Laparoscopy for evaluation of liver disease.   Medical illnesses:  1.  History of idiopathic liver failure treated at [facility]; this apparently has spontaneously resolved without a definitive      diagnosis.  2.  Barrett's esophagus, currently off Protonix.   MEDICATIONS:  1.  Benicar/HCT  2.  Aspirin 325 mg.  3.  Protonix, currently not being taken.   The patient has a laxative habit where she takes Senokot daily. She also  takes multiple herbs which  she did not bring with her.   ALLERGIES:  No known allergies.   SOCIAL HISTORY:  The patient is a divorced white female who is employed as a  Pharmacist, community. She does not have health insurance. She  is raising an adopted child. No tobacco use. She drinks alcohol rarely. No  recreational drug use.   FAMILY HISTORY:  Father deceased at the age of 25 from coronary artery  disease. Mother is 63 years old with hypertension, otherwise well. Brother,  48, well. Sister, 2, well.   REVIEW OF SYSTEMS:  Negative in detail except as outlined above in the  present illness and past medical history.   PHYSICAL EXAMINATION:  GENERAL:  Well-developed, well-nourished female in  moderate distress.  VITAL SIGNS:  Blood pressure 118/78, pulse 103, respirations 16, temperature  99.5.  HEENT:  Within normal limits.  NECK:  No thyromegaly, no adenopathy.  LUNGS:  Clear to auscultation bilaterally.  HEART:  Regular rate and rhythm, no murmurs heard.  ABDOMEN:  Soft but diffusely tender. Hyperactive bowel sounds, no masses, no  visceromegaly.  EXTREMITIES:  Range of motion within normal limits. No calf tenderness, no  ankle edema.   LABORATORY DATA:  CBC:  Hemoglobin 13.1, hematocrit 38.5, white blood cell  count 7.1, platelet count 371. Basic metabolic panel is within normal  limits. Liver function studies are within normal limits. Her lipase is 19.   ASSESSMENT:  1.  Small-bowel obstruction. This is adhesive small-bowel obstruction versus      inflammatory bowel disease. Possibility of a small-bowel tumor or a      localizing introsusception also positive in this patient with chronic      laxative abuse.  2.  History of idiopathic liver failure.  3.  Gastroesophageal reflux disease with Barrett's esophagitis, currently      off proton pump inhibitors.  4.  Hypertension.   PLAN:  1.  We will put the patient at bowel rest and observe closely with serial      KUBs versus CT  scans.  2.  GI consultation. This patient has been followed by Dr. Juanda Chance in the      past.      Leonie Man, M.D.  Electronically Signed     PB/MEDQ  D:  09/21/2005  T:  09/21/2005  Job:  [id number]

## 2011-05-22 ENCOUNTER — Encounter: Payer: Self-pay | Admitting: Internal Medicine

## 2012-03-26 ENCOUNTER — Ambulatory Visit: Payer: Self-pay | Admitting: General Practice

## 2012-07-24 ENCOUNTER — Encounter: Payer: Self-pay | Admitting: Internal Medicine

## 2013-08-27 ENCOUNTER — Ambulatory Visit: Payer: Self-pay | Admitting: General Practice

## 2013-11-26 ENCOUNTER — Ambulatory Visit: Payer: Self-pay | Admitting: Family Medicine

## 2013-12-09 ENCOUNTER — Ambulatory Visit: Payer: Self-pay | Admitting: Family Medicine

## 2014-01-28 ENCOUNTER — Encounter: Payer: Self-pay | Admitting: Gastroenterology

## 2014-01-29 ENCOUNTER — Ambulatory Visit: Payer: Self-pay | Admitting: Family Medicine

## 2014-01-29 ENCOUNTER — Encounter: Payer: Self-pay | Admitting: *Deleted

## 2014-02-02 ENCOUNTER — Ambulatory Visit (INDEPENDENT_AMBULATORY_CARE_PROVIDER_SITE_OTHER): Payer: Medicare HMO

## 2014-02-02 ENCOUNTER — Ambulatory Visit: Payer: Self-pay | Admitting: Internal Medicine

## 2014-02-02 ENCOUNTER — Ambulatory Visit (INDEPENDENT_AMBULATORY_CARE_PROVIDER_SITE_OTHER): Payer: Medicare HMO | Admitting: Gastroenterology

## 2014-02-02 ENCOUNTER — Encounter: Payer: Self-pay | Admitting: Gastroenterology

## 2014-02-02 ENCOUNTER — Other Ambulatory Visit: Payer: Self-pay

## 2014-02-02 VITALS — BP 160/90 | HR 64 | Ht 64.0 in | Wt 148.0 lb

## 2014-02-02 DIAGNOSIS — K2270 Barrett's esophagus without dysplasia: Secondary | ICD-10-CM

## 2014-02-02 DIAGNOSIS — R7989 Other specified abnormal findings of blood chemistry: Secondary | ICD-10-CM

## 2014-02-02 DIAGNOSIS — Z1211 Encounter for screening for malignant neoplasm of colon: Secondary | ICD-10-CM

## 2014-02-02 DIAGNOSIS — R945 Abnormal results of liver function studies: Secondary | ICD-10-CM

## 2014-02-02 DIAGNOSIS — K219 Gastro-esophageal reflux disease without esophagitis: Secondary | ICD-10-CM | POA: Insufficient documentation

## 2014-02-02 LAB — IBC PANEL
IRON: 107 ug/dL (ref 42–145)
SATURATION RATIOS: 30.3 % (ref 20.0–50.0)
TRANSFERRIN: 252.6 mg/dL (ref 212.0–360.0)

## 2014-02-02 LAB — HEPATITIS A ANTIBODY, TOTAL: HEP A TOTAL AB: NONREACTIVE

## 2014-02-02 LAB — HEPATITIS B SURFACE ANTIBODY, QUANTITATIVE: HEPATITIS B-POST: 288 m[IU]/mL

## 2014-02-02 LAB — IGA: IgA: 320 mg/dL (ref 68–378)

## 2014-02-02 LAB — FERRITIN: Ferritin: 158.6 ng/mL (ref 10.0–291.0)

## 2014-02-02 LAB — HEPATITIS C ANTIBODY: HCV Ab: REACTIVE — AB

## 2014-02-02 LAB — HEPATITIS B SURFACE ANTIGEN: HEP B S AG: NEGATIVE

## 2014-02-02 MED ORDER — OMEPRAZOLE 40 MG PO CPDR: 40.0000 mg | DELAYED_RELEASE_CAPSULE | Freq: Every day | ORAL | Status: DC

## 2014-02-02 MED ORDER — MOVIPREP 100 G PO SOLR: 1.0000 | Freq: Once | ORAL | Status: DC

## 2014-02-02 NOTE — Progress Notes (Signed)
I have known Ashley [name] for 25 years. 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 She fully recovered her liver function. This is new. I will see her for EGD/colon, and review labs.

## 2014-02-02 NOTE — Patient Instructions (Addendum)
You have been scheduled for an endoscopy and colonoscopy with propofol. Please follow the written instructions given to you at your visit today. Please pick up your prep at the pharmacy within the next 1-3 days. If you use inhalers (even only as needed), please bring them with you on the day of your procedure. Your physician has requested that you go to www.startemmi.com and enter the access code given to you at your visit today. This web site gives a general overview about your procedure. However, you should still follow specific instructions given to you by our office regarding your preparation for the procedure.  Please go to the basement for lab work before leaving today.  We have sent the following medications to your pharmacy for you to pick up at your convenience: Omeprazole 40 mg, please take one capsule by mouth once daily    You have been scheduled for an abdominal ultrasound at [facility](Go to medical mall entrance) (1st floor of hospital) on 02-02-2014 at 11 am. Please arrive 15 minutes prior to your appointment for registration. Make certain not to have anything to eat or drink 6 hours prior to your appointment. Should you need to reschedule your appointment, please contact radiology at [phone]. This test typically takes about 30 minutes to perform.

## 2014-02-02 NOTE — Progress Notes (Signed)
02/02/2014 Ashley PalmDebra M [name] [id number] 1951/05/19   HISTORY OF PRESENT ILLNESS:  Patient is a pleasant 63 year old female who is known to Dr. Juanda ChanceBrodie in the past.  She was referred back here today by her PCP, Dr. Thana AtesMorrisey, for a few different issues.  First, her last colonoscopy was in 05/2001 at which time she had internal hemorrhoids only.  She denies any rectal bleeding.  She also has history of Barrett's esophagus and her last EGD was in 10/2008 and it had been recommended that she have a repeat procedure in 2-3 years from that time.  At that time she was found to have mild gastritis and Barrett's esophagus.  Biopsies confirmed intestinal metaplasia but no dysplasia.  She has not been on any PPI for a while.  She says that her brother died and she lost track or all of her health issues while she was helping to care for him, etc.  Also, she was found to have elevated LFT's with AST 65, ALT 42, and normal ALP and total bili.  She has a history of liver failure in 1990 and [facility], but she says that they never discovered the cause.  She does admit to drinking a couple of glasses of wine each night to help relax her and help her sleep.  She was taking voltaren recently for arthritic pains and for the pain in her toe (thinks she broke it recently), but discontinued that after finding out about her LFT's.    Recent CBC was normal.   Past Medical History  Diagnosis Date  . Barrett's esophagus   . Gastritis   . Internal hemorrhoids   . Autoimmune liver disease   . Liver failure   . Peptic ulcer disease   . Depression   . Small bowel obstruction   . Anemia   . Migraine   . GERD (gastroesophageal reflux disease)   . Hypertension   . COPD (chronic obstructive pulmonary disease)   . Insomnia   . Ovarian failure   . Attention deficit disorder    Past Surgical History  Procedure Laterality Date  . Oophorectomy    . Abdominal hysterectomy    . Dilation and curettage of uterus       reports that she quit smoking about 25 years ago. She has never used smokeless tobacco. She reports that she drinks alcohol. She reports that she does not use illicit drugs. family history includes Heart failure in her father. There is no history of Colon cancer. No Known Allergies    Outpatient Encounter Prescriptions as of 02/02/2014  Medication Sig  . atenolol (TENORMIN) 100 MG tablet Take 100 mg by mouth daily.  Marland Kitchen. azelastine (OPTIVAR) 0.05 % ophthalmic solution 1 drop 2 (two) times daily.  . Capsaicin 0.1 % CREA Apply 1 application topically 2 (two) times daily.  . chlorthalidone (HYGROTON) 25 MG tablet Take 25 mg by mouth daily.  Marland Kitchen. lisdexamfetamine (VYVANSE) 60 MG capsule Take 60 mg by mouth daily.  Marland Kitchen. loratadine (CLARITIN) 10 MG tablet Take 10 mg by mouth daily.  . meloxicam (MOBIC) 7.5 MG tablet Take 7.5 mg by mouth 2 (two) times daily.  . traMADol (ULTRAM) 50 MG tablet Take by mouth every 8 (eight) hours as needed.  . tretinoin microspheres (RETIN-A MICRO PUMP) 0.1 % gel Apply topically as directed.  . [DISCONTINUED] SUMAtriptan (IMITREX) 100 MG tablet Take 100 mg by mouth every 2 (two) hours as needed for migraine or headache. May repeat  in 2 hours if headache persists or recurs.  . [DISCONTINUED] SUMAtriptan (IMITREX) 20 MG/ACT nasal spray Place 20 mg into the nose every 2 (two) hours as needed for migraine or headache. May repeat in 2 hours if headache persists or recurs.     REVIEW OF SYSTEMS  : All other systems reviewed and negative except where noted in the History of Present Illness.   PHYSICAL EXAM: BP 160/90  Pulse 64  Ht 5\' 4"  (1.626 m)  Wt 148 lb (67.132 kg)  BMI 25.39 kg/m2 General: Well developed white female in no acute distress Head: Normocephalic and atraumatic Eyes:  Sclerae anicteric, conjunctiva pink. Ears: Normal auditory acuity.  Lungs: Clear throughout to auscultation Heart: Regular rate and rhythm Abdomen: Soft, non-distended.  Normal bowel  sounds.  Non-tender. Rectal:  Deferred.  Will be done at the time of colonoscopy. Musculoskeletal: Symmetrical with no gross deformities  Skin: No lesions on visible extremities Extremities: No edema  Neurological: Alert oriented x 4, grossly non-focal Psychological:  Alert and cooperative. Normal mood and affect  ASSESSMENT AND PLAN: -Elevated LFT's:  Mild elevation of transaminases.  Has history of liver failure in 1990 of unknown cause.  Will check ultrasound.  Also routine serologic evaluation including viral Hepatitis panel, iron studies, ANA, AMA, ASMA, alpha 1-antitrypsin, ceruloplasmin, and celiac studies.  Her elevation could be secondary to fatty liver and ETOH use (drinks two glasses of wine per night). -Barrett's esophagus/GERD:  Will schedule EGD for surveillance.  Will restart her on daily PPI in the form of omeprazole 40 mg daily. -Screening colonoscopy:  Will schedule colonoscopy.  The risks, benefits, and alternatives were discussed with the patient and she consents to proceed.

## 2014-02-03 ENCOUNTER — Other Ambulatory Visit: Payer: Self-pay | Admitting: *Deleted

## 2014-02-03 ENCOUNTER — Telehealth: Payer: Self-pay | Admitting: *Deleted

## 2014-02-03 DIAGNOSIS — R7989 Other specified abnormal findings of blood chemistry: Secondary | ICD-10-CM

## 2014-02-03 DIAGNOSIS — R945 Abnormal results of liver function studies: Secondary | ICD-10-CM

## 2014-02-03 LAB — TISSUE TRANSGLUTAMINASE, IGA: Tissue Transglutaminase Ab, IgA: 4.5 U/mL (ref <20)

## 2014-02-03 LAB — ANA: Anti Nuclear Antibody(ANA): POSITIVE — AB

## 2014-02-03 LAB — ANTI-NUCLEAR AB-TITER (ANA TITER)

## 2014-02-03 LAB — MITOCHONDRIAL ANTIBODIES: Mitochondrial M2 Ab, IgG: 0.6 (ref <0.91)

## 2014-02-03 NOTE — Telephone Encounter (Signed)
Patient is asking for labs to be drawn at [facility] [address]. Ok per GriswoldJessica. Called and spoke with Burna MortimerWanda at the drawn center and they can draw the lab. Faxed request to [phone]. Patient aware.

## 2014-02-04 LAB — ANTI-SMOOTH MUSCLE/MITOCHOND.
Mitochondrial Ab: 6.4 Units (ref 0.0–20.0)
Smooth Muscle Ab: 22 Units — ABNORMAL HIGH (ref 0–19)

## 2014-02-04 LAB — CERULOPLASMIN: CERULOPLASMIN: 29 mg/dL (ref 18–53)

## 2014-02-04 LAB — ALPHA-1-ANTITRYPSIN: A-1 Antitrypsin, Ser: 153 mg/dL (ref 83–199)

## 2014-02-07 LAB — HCV RNA NAA QUAL RFX TO QUANT: HCV RNA NAA Qualitative: NEGATIVE

## 2014-02-09 ENCOUNTER — Encounter: Payer: Self-pay | Admitting: Internal Medicine

## 2014-03-08 ENCOUNTER — Telehealth: Payer: Self-pay | Admitting: Internal Medicine

## 2014-03-08 NOTE — Telephone Encounter (Signed)
Yes, please charge

## 2014-03-10 ENCOUNTER — Encounter: Payer: Self-pay | Admitting: Internal Medicine

## 2014-03-10 ENCOUNTER — Encounter: Payer: Medicare HMO | Admitting: Internal Medicine

## 2015-04-28 ENCOUNTER — Other Ambulatory Visit: Payer: Self-pay | Admitting: Family Medicine

## 2015-04-29 ENCOUNTER — Encounter: Payer: Self-pay | Admitting: Emergency Medicine

## 2015-04-29 ENCOUNTER — Other Ambulatory Visit: Payer: Self-pay | Admitting: Emergency Medicine

## 2015-04-29 NOTE — Telephone Encounter (Signed)
Spoke to patient, she already had refills at pharmacy and picked up on yesterday. Appointment is scheduled for Monday will get additional refills at that time

## 2015-05-02 ENCOUNTER — Ambulatory Visit (INDEPENDENT_AMBULATORY_CARE_PROVIDER_SITE_OTHER): Payer: Managed Care, Other (non HMO) | Admitting: Family Medicine

## 2015-05-02 ENCOUNTER — Encounter: Payer: Self-pay | Admitting: Family Medicine

## 2015-05-02 ENCOUNTER — Other Ambulatory Visit: Payer: Self-pay | Admitting: Family Medicine

## 2015-05-02 VITALS — BP 138/82 | HR 72 | Temp 98.6°F | Resp 16 | Ht 64.0 in | Wt 150.5 lb

## 2015-05-02 DIAGNOSIS — F909 Attention-deficit hyperactivity disorder, unspecified type: Secondary | ICD-10-CM | POA: Diagnosis not present

## 2015-05-02 DIAGNOSIS — I10 Essential (primary) hypertension: Secondary | ICD-10-CM | POA: Diagnosis not present

## 2015-05-02 DIAGNOSIS — G43009 Migraine without aura, not intractable, without status migrainosus: Secondary | ICD-10-CM

## 2015-05-02 DIAGNOSIS — F988 Other specified behavioral and emotional disorders with onset usually occurring in childhood and adolescence: Secondary | ICD-10-CM

## 2015-05-02 HISTORY — DX: Attention-deficit hyperactivity disorder, unspecified type: F90.9

## 2015-05-02 MED ORDER — LISDEXAMFETAMINE DIMESYLATE 60 MG PO CAPS: 60.0000 mg | ORAL_CAPSULE | Freq: Every day | ORAL | Status: DC

## 2015-05-02 NOTE — Patient Instructions (Addendum)
Basic Carbohydrate Counting for Diabetes Mellitus Carbohydrate counting is a method for keeping track of the amount of carbohydrates you eat. Eating carbohydrates naturally increases the level of sugar (glucose) in your blood, so it is important for you to know the amount that is okay for you to have in every meal. Carbohydrate counting helps keep the level of glucose in your blood within normal limits. The amount of carbohydrates allowed is different for every person. A dietitian can help you calculate the amount that is right for you. Once you know the amount of carbohydrates you can have, you can count the carbohydrates in the foods you want to eat. Carbohydrates are found in the following foods:  Grains, such as breads and cereals.  Dried beans and soy products.  Starchy vegetables, such as potatoes, peas, and corn.  Fruit and fruit juices.  Milk and yogurt.  Sweets and snack foods, such as cake, cookies, candy, chips, soft drinks, and fruit drinks. CARBOHYDRATE COUNTING There are two ways to count the carbohydrates in your food. You can use either of the methods or a combination of both. Reading the "Nutrition Facts" on Packaged Food The "Nutrition Facts" is an area that is included on the labels of almost all packaged food and beverages in the [place]. It includes the serving size of that food or beverage and information about the nutrients in each serving of the food, including the grams (g) of carbohydrate per serving.  Decide the number of servings of this food or beverage that you will be able to eat or drink. Multiply that number of servings by the number of grams of carbohydrate that is listed on the label for that serving. The total will be the amount of carbohydrates you will be having when you eat or drink this food or beverage. Learning Standard Serving Sizes of Food When you eat food that is not packaged or does not include "Nutrition Facts" on the label, you need to  measure the servings in order to count the amount of carbohydrates.A serving of most carbohydrate-rich foods contains about 15 g of carbohydrates. The following list includes serving sizes of carbohydrate-rich foods that provide 15 g ofcarbohydrate per serving:   1 slice of bread (1 oz) or 1 six-inch tortilla.    of a hamburger bun or English muffin.  4-6 crackers.   cup unsweetened dry cereal.    cup hot cereal.   cup rice or pasta.    cup mashed potatoes or  of a large baked potato.  1 cup fresh fruit or one small piece of fruit.    cup canned or frozen fruit or fruit juice.  1 cup milk.   cup plain fat-free yogurt or yogurt sweetened with artificial sweeteners.   cup cooked dried beans or starchy vegetable, such as peas, corn, or potatoes.  Decide the number of standard-size servings that you will eat. Multiply that number of servings by 15 (the grams of carbohydrates in that serving). For example, if you eat 2 cups of strawberries, you will have eaten 2 servings and 30 g of carbohydrates (2 servings x 15 g = 30 g). For foods such as soups and casseroles, in which more than one food is mixed in, you will need to count the carbohydrates in each food that is included. EXAMPLE OF CARBOHYDRATE COUNTING Sample Dinner  3 oz chicken breast.   cup of brown rice.   cup of corn.  1 cup milk.   1 cup strawberries with   sugar-free whipped topping.  Carbohydrate Calculation Step 1: Identify the foods that contain carbohydrates:   Rice.   Corn.   Milk.   Strawberries. Step 2:Calculate the number of servings eaten of each:   2 servings of rice.   1 serving of corn.   1 serving of milk.   1 serving of strawberries. Step 3: Multiply each of those number of servings by 15 g:   2 servings of rice x 15 g = 30 g.   1 serving of corn x 15 g = 15 g.   1 serving of milk x 15 g = 15 g.   1 serving of strawberries x 15 g = 15 g. Step 4: Add  together all of the amounts to find the total grams of carbohydrates eaten: 30 g + 15 g + 15 g + 15 g = 75 g. Document Released: 10/22/2005 Document Revised: 03/08/2014 Document Reviewed: 09/18/2013 [facility] Patient Information 2015 [place]. This information is not intended to replace advice given to you by your health care provider. Make sure you discuss any questions you have with your health care provider. Obesity Obesity is defined as having too much total body fat and a body mass index (BMI) of 30 or more. BMI is an estimate of body fat and is calculated from your height and weight. Obesity happens when you consume more calories than you can burn by exercising or performing daily physical tasks. Prolonged obesity can cause major illnesses or emergencies, such as:   Stroke.  Heart disease.  Diabetes.  Cancer.  Arthritis.  High blood pressure (hypertension).  High cholesterol.  Sleep apnea.  Erectile dysfunction.  Infertility problems. CAUSES   Regularly eating unhealthy foods.  Physical inactivity.  Certain disorders, such as an underactive thyroid (hypothyroidism), Cushing's syndrome, and polycystic ovarian syndrome.  Certain medicines, such as steroids, some depression medicines, and antipsychotics.  Genetics.  Lack of sleep. DIAGNOSIS  A health care provider can diagnose obesity after calculating your BMI. Obesity will be diagnosed if your BMI is 30 or higher.  There are other methods of measuring obesity levels. Some other methods include measuring your skinfold thickness, your waist circumference, and comparing your hip circumference to your waist circumference. TREATMENT  A healthy treatment program includes some or all of the following:  Long-term dietary changes.  Exercise and physical activity.  Behavioral and lifestyle changes.  Medicine only under the supervision of your health care provider. Medicines may help, but only if they are used with  diet and exercise programs. An unhealthy treatment program includes:  Fasting.  Fad diets.  Supplements and drugs. These choices do not succeed in long-term weight control.  HOME CARE INSTRUCTIONS   Exercise and perform physical activity as directed by your health care provider. To increase physical activity, try the following:  Use stairs instead of elevators.  Park farther away from store entrances.  Garden, bike, or walk instead of watching television or using the computer.  Eat healthy, low-calorie foods and drinks on a regular basis. Eat more fruits and vegetables. Use low-calorie cookbooks or take healthy cooking classes.  Limit fast food, sweets, and processed snack foods.  Eat smaller portions.  Keep a daily journal of everything you eat. There are many free websites to help you with this. It may be helpful to measure your foods so you can determine if you are eating the correct portion sizes.  Avoid drinking alcohol. Drink more water and drinks without calories.  Take vitamins and  supplements only as recommended by your health care provider.  Weight-loss support groups, Government social research officer, counselors, and stress reduction education can also be very helpful. SEEK IMMEDIATE MEDICAL CARE IF:  You have chest pain or tightness.  You have trouble breathing or feel short of breath.  You have weakness or leg numbness.  You feel confused or have trouble talking.  You have sudden changes in your vision. MAKE SURE YOU:  Understand these instructions.  Will watch your condition.  Will get help right away if you are not doing well or get worse. Document Released: 11/29/2004 Document Revised: 03/08/2014 Document Reviewed: 11/28/2011 [facility] Patient Information 2015 [place], [place]. This information is not intended to replace advice given to you by your health care provider. Make sure you discuss any questions you have with your health care provider.

## 2015-05-02 NOTE — Progress Notes (Signed)
Name: Ashley [name]   MRN: [id number]    DOB: 08-19-1951   Date:05/02/2015       Progress Note  Subjective  Chief Complaint  Chief Complaint  Patient presents with  . ADD    HPI  ADD  Patient is stable on ADD medications for number of years. She has been able to focus and concentrate her job activities. Uses medication and we can as needed. No side effects currently has good strong appetite and sleeping well.  Past Medical History  Diagnosis Date  . Barrett's esophagus   . Gastritis   . Internal hemorrhoids   . Autoimmune liver disease   . Liver failure   . Peptic ulcer disease   . Depression   . Small bowel obstruction   . Anemia   . Migraine   . GERD (gastroesophageal reflux disease)   . Hypertension   . COPD (chronic obstructive pulmonary disease)   . Insomnia   . Ovarian failure   . Attention deficit disorder     History  Substance Use Topics  . Smoking status: Former Smoker    Quit date: 11/05/1988  . Smokeless tobacco: Never Used  . Alcohol Use: Yes     Comment: 1 glass on weekends     Current outpatient prescriptions:  .  atenolol (TENORMIN) 100 MG tablet, Take 100 mg by mouth daily., Disp: , Rfl:  .  Capsaicin 0.1 % CREA, Apply 1 application topically 2 (two) times daily., Disp: , Rfl:  .  chlorthalidone (HYGROTON) 25 MG tablet, Take 25 mg by mouth daily., Disp: , Rfl:  .  fluticasone (FLONASE) 50 MCG/ACT nasal spray, , Disp: , Rfl:  .  lisdexamfetamine (VYVANSE) 60 MG capsule, Take 60 mg by mouth daily., Disp: , Rfl:  .  loratadine (CLARITIN) 10 MG tablet, Take 10 mg by mouth daily., Disp: , Rfl:  .  MOVIPREP 100 G SOLR, Take 1 kit (200 g total) by mouth once., Disp: 1 kit, Rfl: 0 .  omeprazole (PRILOSEC) 40 MG capsule, Take 1 capsule (40 mg total) by mouth daily., Disp: 30 capsule, Rfl: 3 .  SUMAtriptan (IMITREX) 100 MG tablet, , Disp: , Rfl:  .  traMADol (ULTRAM) 50 MG tablet, Take by mouth every 8 (eight) hours as needed., Disp: , Rfl:  .   tretinoin microspheres (RETIN-A MICRO PUMP) 0.1 % gel, Apply topically as directed., Disp: , Rfl:  .  Vitamin D, Ergocalciferol, (DRISDOL) 50000 UNITS CAPS capsule, , Disp: , Rfl:   Allergies  Allergen Reactions  . Topamax [Topiramate]     Review of Systems  Constitutional: Negative for fever, chills and weight loss.  HENT: Negative for congestion, hearing loss, sore throat and tinnitus.   Eyes: Negative for blurred vision, double vision and redness.  Respiratory: Negative for cough, hemoptysis and shortness of breath.   Cardiovascular: Negative for chest pain, palpitations, orthopnea, claudication and leg swelling.  Gastrointestinal: Negative for heartburn, nausea, vomiting, diarrhea, constipation and blood in stool.  Genitourinary: Negative for dysuria, urgency, frequency and hematuria.  Musculoskeletal: Negative for myalgias, back pain, joint pain, falls and neck pain.  Skin: Negative for itching.  Neurological: Negative for dizziness, tingling, tremors, focal weakness, seizures, loss of consciousness, weakness and headaches.       ADD  Endo/Heme/Allergies: Does not bruise/bleed easily.  Psychiatric/Behavioral: Negative for depression and substance abuse. The patient is nervous/anxious. The patient does not have insomnia.        ADD     Objective  Filed Vitals:   05/02/15 1326  BP: 138/82  Pulse: 72  Temp: 98.6 F (37 C)  Resp: 16  Height: 5' 4"  (1.626 m)  Weight: 150 lb 8 oz (68.266 kg)  SpO2: 93%     Physical Exam  Constitutional: She is oriented to person, place, and time and well-developed, well-nourished, and in no distress.  HENT:  Head: Normocephalic.  Eyes: EOM are normal. Pupils are equal, round, and reactive to light.  Neck: Normal range of motion. No thyromegaly present.  Cardiovascular: Normal rate, regular rhythm and normal heart sounds.   No murmur heard. Pulmonary/Chest: Effort normal and breath sounds normal.  Abdominal: Soft. Bowel sounds are  normal.  Musculoskeletal: Normal range of motion. She exhibits no edema.  Neurological: She is alert and oriented to person, place, and time. No cranial nerve deficit. Gait normal.  No tremor  Skin: Skin is warm and dry. No rash noted.  Psychiatric: Memory, affect and judgment normal.      Assessment & Plan

## 2015-05-02 NOTE — Telephone Encounter (Signed)
Patient requesting refill. 

## 2015-05-04 ENCOUNTER — Other Ambulatory Visit: Payer: Self-pay | Admitting: Family Medicine

## 2015-05-04 ENCOUNTER — Telehealth: Payer: Self-pay | Admitting: Family Medicine

## 2015-05-04 MED ORDER — TRAMADOL HCL 50 MG PO TABS: 50.0000 mg | ORAL_TABLET | Freq: Four times a day (QID) | ORAL | Status: DC | PRN

## 2015-05-05 ENCOUNTER — Other Ambulatory Visit: Payer: Self-pay | Admitting: Family Medicine

## 2015-05-05 ENCOUNTER — Other Ambulatory Visit: Payer: Self-pay

## 2015-05-10 ENCOUNTER — Other Ambulatory Visit: Payer: Self-pay | Admitting: Family Medicine

## 2015-05-10 NOTE — Telephone Encounter (Signed)
Dr. Thana AtesMorrisey wrote out script for patient to pick up at front desk. PAtient notified

## 2015-05-10 NOTE — Telephone Encounter (Signed)
Done by Dr. Thana AtesMorrisey

## 2015-05-13 NOTE — Telephone Encounter (Signed)
DID SHE NOT GET IT 6/29?

## 2015-05-16 ENCOUNTER — Telehealth: Payer: Self-pay | Admitting: Emergency Medicine

## 2015-05-16 NOTE — Telephone Encounter (Signed)
[facility]

## 2015-05-26 ENCOUNTER — Other Ambulatory Visit: Payer: Self-pay | Admitting: Family Medicine

## 2015-05-31 ENCOUNTER — Telehealth: Payer: Self-pay | Admitting: Family Medicine

## 2015-05-31 NOTE — Telephone Encounter (Signed)
Called pharmacy [facility] and Dr. Thana Ates ok for 1 day refill early

## 2015-05-31 NOTE — Telephone Encounter (Signed)
Patient uses [facility]. Patient states that she has to work tomorrow therefore she is requesting that you refill her Vyvance a day early. It isn't due until tomorrow.

## 2015-06-29 ENCOUNTER — Telehealth: Payer: Self-pay | Admitting: Family Medicine

## 2015-06-29 NOTE — Telephone Encounter (Signed)
Pt states she misplaced her RX for her Vyvance and she is wanting to know if she can get a new RX.

## 2015-06-30 ENCOUNTER — Other Ambulatory Visit: Payer: Self-pay | Admitting: Family Medicine

## 2015-06-30 DIAGNOSIS — F988 Other specified behavioral and emotional disorders with onset usually occurring in childhood and adolescence: Secondary | ICD-10-CM

## 2015-06-30 MED ORDER — LISDEXAMFETAMINE DIMESYLATE 60 MG PO CAPS: 60.0000 mg | ORAL_CAPSULE | Freq: Every day | ORAL | Status: DC

## 2015-06-30 NOTE — Telephone Encounter (Signed)
LMOM to inform pt °

## 2015-08-02 ENCOUNTER — Other Ambulatory Visit: Payer: Self-pay | Admitting: Emergency Medicine

## 2015-08-02 DIAGNOSIS — F988 Other specified behavioral and emotional disorders with onset usually occurring in childhood and adolescence: Secondary | ICD-10-CM

## 2015-08-02 MED ORDER — LISDEXAMFETAMINE DIMESYLATE 60 MG PO CAPS: 60.0000 mg | ORAL_CAPSULE | Freq: Every day | ORAL | Status: DC

## 2015-08-25 ENCOUNTER — Ambulatory Visit: Payer: Managed Care, Other (non HMO) | Admitting: Family Medicine

## 2015-08-26 ENCOUNTER — Other Ambulatory Visit: Payer: Self-pay | Admitting: Family Medicine

## 2015-08-29 ENCOUNTER — Telehealth: Payer: Self-pay | Admitting: Family Medicine

## 2015-08-29 DIAGNOSIS — F988 Other specified behavioral and emotional disorders with onset usually occurring in childhood and adolescence: Secondary | ICD-10-CM

## 2015-08-29 NOTE — Telephone Encounter (Signed)
Patient is needing a refill on Vyvanse and Tramadol.  Patient was originally scheduled for last Thursday but was r/s due to provider being out.  Patient then scheduled for Tuesday 08/30/15 but had to cancel due to work.  Patient wanted to come in next week but provider will be on vacation.

## 2015-08-30 ENCOUNTER — Ambulatory Visit: Payer: Managed Care, Other (non HMO) | Admitting: Family Medicine

## 2015-08-31 MED ORDER — LISDEXAMFETAMINE DIMESYLATE 60 MG PO CAPS: 60.0000 mg | ORAL_CAPSULE | Freq: Every day | ORAL | Status: DC

## 2015-08-31 NOTE — Telephone Encounter (Signed)
Done, script for Vyvanse only

## 2015-09-19 ENCOUNTER — Ambulatory Visit (INDEPENDENT_AMBULATORY_CARE_PROVIDER_SITE_OTHER): Payer: Managed Care, Other (non HMO) | Admitting: Family Medicine

## 2015-09-19 ENCOUNTER — Encounter: Payer: Self-pay | Admitting: Family Medicine

## 2015-09-19 VITALS — BP 122/84 | HR 70 | Temp 97.6°F | Resp 16 | Ht 64.0 in | Wt 152.5 lb

## 2015-09-19 DIAGNOSIS — G43101 Migraine with aura, not intractable, with status migrainosus: Secondary | ICD-10-CM | POA: Diagnosis not present

## 2015-09-19 DIAGNOSIS — J309 Allergic rhinitis, unspecified: Secondary | ICD-10-CM | POA: Insufficient documentation

## 2015-09-19 DIAGNOSIS — F909 Attention-deficit hyperactivity disorder, unspecified type: Secondary | ICD-10-CM | POA: Diagnosis not present

## 2015-09-19 DIAGNOSIS — J449 Chronic obstructive pulmonary disease, unspecified: Secondary | ICD-10-CM | POA: Insufficient documentation

## 2015-09-19 DIAGNOSIS — J42 Unspecified chronic bronchitis: Secondary | ICD-10-CM | POA: Diagnosis not present

## 2015-09-19 DIAGNOSIS — N183 Chronic kidney disease, stage 3 unspecified: Secondary | ICD-10-CM | POA: Insufficient documentation

## 2015-09-19 DIAGNOSIS — M179 Osteoarthritis of knee, unspecified: Secondary | ICD-10-CM | POA: Insufficient documentation

## 2015-09-19 DIAGNOSIS — I10 Essential (primary) hypertension: Secondary | ICD-10-CM | POA: Diagnosis not present

## 2015-09-19 DIAGNOSIS — E559 Vitamin D deficiency, unspecified: Secondary | ICD-10-CM | POA: Diagnosis not present

## 2015-09-19 DIAGNOSIS — M1710 Unilateral primary osteoarthritis, unspecified knee: Secondary | ICD-10-CM | POA: Insufficient documentation

## 2015-09-19 DIAGNOSIS — F988 Other specified behavioral and emotional disorders with onset usually occurring in childhood and adolescence: Secondary | ICD-10-CM

## 2015-09-19 MED ORDER — TRAMADOL HCL 50 MG PO TABS: 50.0000 mg | ORAL_TABLET | Freq: Four times a day (QID) | ORAL | Status: DC | PRN

## 2015-09-19 MED ORDER — LISDEXAMFETAMINE DIMESYLATE 60 MG PO CAPS: 60.0000 mg | ORAL_CAPSULE | Freq: Every day | ORAL | Status: DC

## 2015-09-19 NOTE — Progress Notes (Signed)
Name: Ashley [name]   MRN: [id number]    DOB: 29-Jan-1951   Date:09/19/2015       Progress Note  Subjective  Chief Complaint  Chief Complaint  Patient presents with  . ADHD    3 month follow up  . Headache  . Knee Pain    refill chronic medication    HPI  ADHD  Patient has a long-standing history of ADHD which is currently well controlled without 60 mg daily. She is able to function at her work on task. There are no significant side effects.  Migraine headaches  Patient is on Imitrex both injectable and tablets according to severity of her headaches. She is also on atenolol prophylactically. They are basically stable. They're accompanied by some nausea without vomiting and occasionally some photophobia and phonophobia.  Chronic knee pain  Patient takes tramadol when necessary for knee pain. She has osteoarthritis of the knees and Tylenol alone is insufficient. She cannot tolerate NSAIDs because of history of GI and her disease  Hypertension   Patient presents for follow-up of hypertension. It has been present for over 5 years.  Patient states that there is compliance with medical regimen which consists of atenolol 100 mg daily and losartan 50 mg daily . There is no end organ disease. Cardiac risk factors include hypertension hyperlipidemia and diabetes.  Exercise regimen consist of some walking .  Diet consist of some salt restriction .  Past Medical History  Diagnosis Date  . Barrett's esophagus   . Gastritis   . Internal hemorrhoids   . Autoimmune liver disease   . Liver failure (HCC)   . Peptic ulcer disease   . Depression   . Small bowel obstruction (HCC)   . Anemia   . Migraine   . GERD (gastroesophageal reflux disease)   . Hypertension   . COPD (chronic obstructive pulmonary disease) (HCC)   . Insomnia   . Ovarian failure   . Attention deficit disorder     Social History  Substance Use Topics  . Smoking status: Former Smoker    Quit date: 11/05/1988  .  Smokeless tobacco: Never Used  . Alcohol Use: Yes     Comment: 1 glass on weekends     Current outpatient prescriptions:  .  atenolol (TENORMIN) 100 MG tablet, Take 100 mg by mouth daily., Disp: , Rfl:  .  Capsaicin 0.1 % CREA, Apply 1 application topically 2 (two) times daily., Disp: , Rfl:  .  fluticasone (FLONASE) 50 MCG/ACT nasal spray, , Disp: , Rfl:  .  lisdexamfetamine (VYVANSE) 60 MG capsule, Take 1 capsule (60 mg total) by mouth daily., Disp: 30 capsule, Rfl: 0 .  loratadine (CLARITIN) 10 MG tablet, Take 10 mg by mouth daily., Disp: , Rfl:  .  losartan (COZAAR) 50 MG tablet, TAKE ONE TABLET BY MOUTH EVERY DAY, Disp: 30 tablet, Rfl: 5 .  omeprazole (PRILOSEC) 40 MG capsule, Take 1 capsule (40 mg total) by mouth daily., Disp: 30 capsule, Rfl: 3 .  SUMAtriptan (IMITREX) 100 MG tablet, TAKE 1 TABLET AT ONSET OF HEADACHE MAY REPEAT IN 2 HOURS, Disp: 18 tablet, Rfl: 2 .  SUMAtriptan (IMITREX) 20 MG/ACT nasal spray, TAKE 1 NASAL SOLUTION AT FIRST ONSET OF MIGRAINE MAY REPEAT IN 2 HOURS, Disp: 3 Inhaler, Rfl: 3 .  traMADol (ULTRAM) 50 MG tablet, Take 1 tablet (50 mg total) by mouth every 6 (six) hours as needed., Disp: 90 tablet, Rfl: 0  Allergies  Allergen Reactions  . Topamax [  Topiramate]     Review of Systems  Constitutional: Negative for fever, chills and weight loss.  HENT: Negative for congestion, hearing loss, sore throat and tinnitus.   Eyes: Negative for blurred vision, double vision and redness.  Respiratory: Negative for cough, hemoptysis and shortness of breath.   Cardiovascular: Negative for chest pain, palpitations, orthopnea, claudication and leg swelling.  Gastrointestinal: Negative for heartburn, nausea, vomiting, diarrhea, constipation and blood in stool.  Genitourinary: Negative for dysuria, urgency, frequency and hematuria.  Musculoskeletal: Positive for joint pain (primarily in the knees). Negative for myalgias, back pain, falls and neck pain.  Skin: Negative for  itching.  Neurological: Positive for headaches. Negative for dizziness, tingling, tremors, focal weakness, seizures, loss of consciousness and weakness.       ADD  Endo/Heme/Allergies: Does not bruise/bleed easily.  Psychiatric/Behavioral: Negative for depression and substance abuse. The patient is not nervous/anxious and does not have insomnia.      Objective  Filed Vitals:   09/19/15 1332  BP: 122/84  Pulse: 70  Temp: 97.6 F (36.4 C)  TempSrc: Oral  Resp: 16  Height: 5\' 4"  (1.626 m)  Weight: 152 lb 8 oz (69.174 kg)  SpO2: 96%     Physical Exam  Constitutional: She is oriented to person, place, and time and well-developed, well-nourished, and in no distress.  HENT:  Head: Normocephalic.  Eyes: EOM are normal. Pupils are equal, round, and reactive to light.  Neck: Normal range of motion. No thyromegaly present.  Cardiovascular: Normal rate, regular rhythm and normal heart sounds.   No murmur heard. Pulmonary/Chest: Effort normal and breath sounds normal.  Abdominal: Soft. Bowel sounds are normal.  Musculoskeletal: She exhibits no edema.  Some crepitus of the right knee  Neurological: She is alert and oriented to person, place, and time. No cranial nerve deficit. Gait normal.  Skin: Skin is warm and dry. No rash noted.  Psychiatric: Memory normal.  Patient slightly anxious and loquacious      Assessment & Plan   1. Benign essential HTN Well-controlled - Comprehensive Metabolic Panel (CMET) - TSH  2. Essential hypertension Well-controlled stable - Comprehensive Metabolic Panel (CMET) - TSH - Comprehensive Metabolic Panel (CMET) - Lipid panel  3. Migraine with aura and with status migrainosus, not intractable Stable - Comprehensive Metabolic Panel (CMET)  4. Chronic bronchitis, unspecified chronic bronchitis type (HCC) Stable  5. ADD (attention deficit disorder) Stable - lisdexamfetamine (VYVANSE) 60 MG capsule; Take 1 capsule (60 mg total) by mouth  daily.  Dispense: 30 capsule; Refill: 0 - lisdexamfetamine (VYVANSE) 60 MG capsule; Take 1 capsule (60 mg total) by mouth daily.  Dispense: 30 capsule; Refill: 0 - lisdexamfetamine (VYVANSE) 60 MG capsule; Take 1 capsule (60 mg total) by mouth daily. rf 10/19/15  Dispense: 30 capsule; Refill: 0 - lisdexamfetamine (VYVANSE) 60 MG capsule; Take 1 capsule (60 mg total) by mouth daily.  Dispense: 30 capsule; Refill: 0   6. Vitamin D deficiency Vitamin D level and continue over-the-counter vitamin D supplementation - Vitamin D (25 hydroxy)

## 2015-10-27 ENCOUNTER — Emergency Department
Admission: EM | Admit: 2015-10-27 | Discharge: 2015-10-27 | Disposition: A | Discharge status: Home / Self Care | Payer: Worker's Compensation | Attending: Emergency Medicine | Admitting: Emergency Medicine

## 2015-10-27 ENCOUNTER — Other Ambulatory Visit: Payer: Self-pay | Admitting: Family Medicine

## 2015-10-27 DIAGNOSIS — Z79899 Other long term (current) drug therapy: Secondary | ICD-10-CM | POA: Insufficient documentation

## 2015-10-27 DIAGNOSIS — Z87891 Personal history of nicotine dependence: Secondary | ICD-10-CM | POA: Insufficient documentation

## 2015-10-27 DIAGNOSIS — J441 Chronic obstructive pulmonary disease with (acute) exacerbation: Secondary | ICD-10-CM | POA: Diagnosis not present

## 2015-10-27 DIAGNOSIS — I129 Hypertensive chronic kidney disease with stage 1 through stage 4 chronic kidney disease, or unspecified chronic kidney disease: Secondary | ICD-10-CM | POA: Diagnosis not present

## 2015-10-27 DIAGNOSIS — R0602 Shortness of breath: Secondary | ICD-10-CM | POA: Diagnosis present

## 2015-10-27 DIAGNOSIS — J385 Laryngeal spasm: Secondary | ICD-10-CM | POA: Insufficient documentation

## 2015-10-27 DIAGNOSIS — N183 Chronic kidney disease, stage 3 (moderate): Secondary | ICD-10-CM | POA: Insufficient documentation

## 2015-10-27 DIAGNOSIS — J399 Disease of upper respiratory tract, unspecified: Secondary | ICD-10-CM

## 2015-10-27 DIAGNOSIS — J989 Respiratory disorder, unspecified: Secondary | ICD-10-CM

## 2015-10-27 LAB — COMPREHENSIVE METABOLIC PANEL
ALBUMIN: 4.5 g/dL (ref 3.5–5.0)
ALK PHOS: 89 U/L (ref 38–126)
ALT: 28 U/L (ref 14–54)
ANION GAP: 10 (ref 5–15)
AST: 37 U/L (ref 15–41)
BUN: 18 mg/dL (ref 6–20)
CALCIUM: 9.3 mg/dL (ref 8.9–10.3)
CHLORIDE: 101 mmol/L (ref 101–111)
CO2: 28 mmol/L (ref 22–32)
Creatinine, Ser: 0.69 mg/dL (ref 0.44–1.00)
GFR calc Af Amer: >60 mL/min (ref >60)
GFR calc non Af Amer: >60 mL/min (ref >60)
GLUCOSE: 84 mg/dL (ref 65–99)
POTASSIUM: 4.4 mmol/L (ref 3.5–5.1)
SODIUM: 139 mmol/L (ref 135–145)
Total Bilirubin: 0.9 mg/dL (ref 0.3–1.2)
Total Protein: 8.4 g/dL — ABNORMAL HIGH (ref 6.5–8.1)

## 2015-10-27 LAB — CBC WITH DIFFERENTIAL/PLATELET
BASOS PCT: 1 %
Basophils Absolute: 0 10*3/uL (ref 0–0.1)
EOS ABS: 0.1 10*3/uL (ref 0–0.7)
Eosinophils Relative: 3 %
HCT: 43.7 % (ref 35.0–47.0)
HEMOGLOBIN: 14.4 g/dL (ref 12.0–16.0)
Lymphocytes Relative: 20 %
Lymphs Abs: 1 10*3/uL (ref 1.0–3.6)
MCH: 33.9 pg (ref 26.0–34.0)
MCHC: 33 g/dL (ref 32.0–36.0)
MCV: 102.7 fL — ABNORMAL HIGH (ref 80.0–100.0)
MONOS PCT: 7 %
Monocytes Absolute: 0.4 10*3/uL (ref 0.2–0.9)
NEUTROS ABS: 3.6 10*3/uL (ref 1.4–6.5)
Neutrophils Relative %: 69 %
PLATELETS: 300 10*3/uL (ref 150–440)
RBC: 4.25 MIL/uL (ref 3.80–5.20)
RDW: 13.8 % (ref 11.5–14.5)
WBC: 5.1 10*3/uL (ref 3.6–11.0)

## 2015-10-27 MED ORDER — RACEPINEPHRINE HCL 2.25 % IN NEBU
0.5000 mL | INHALATION_SOLUTION | Freq: Once | RESPIRATORY_TRACT | Status: AC
  Administered 2015-10-27: 0.5 mL via RESPIRATORY_TRACT
  Filled 2015-10-27: qty 0.5

## 2015-10-27 MED ORDER — DEXAMETHASONE SODIUM PHOSPHATE 10 MG/ML IJ SOLN
10.0000 mg | Freq: Once | INTRAMUSCULAR | Status: AC
  Administered 2015-10-27: 10 mg via INTRAVENOUS
  Filled 2015-10-27 (×2): qty 1

## 2015-10-27 MED ORDER — DIPHENHYDRAMINE HCL 50 MG/ML IJ SOLN
12.5000 mg | Freq: Once | INTRAMUSCULAR | Status: AC
  Administered 2015-10-27: 12.5 mg via INTRAVENOUS
  Filled 2015-10-27: qty 1

## 2015-10-27 MED ORDER — PREDNISONE 20 MG PO TABS: ORAL_TABLET | ORAL | Status: DC

## 2015-10-27 NOTE — Discharge Instructions (Signed)
You appear to have had some laryngeal spasm. It is unclear what triggered this. You were treated with racemic epinephrine and with Decadron. You improved significantly. Your blood tests looked good. If you have any further symptoms, he may begin prednisone tomorrow and he should follow-up with your regular doctor or with ENT. If you have shortness of breath, difficult the swelling, or other urgent concerns, return to the emergency department.

## 2015-10-27 NOTE — ED Provider Notes (Addendum)
[facility] Emergency Department Provider Note  ____________________________________________  Time seen: 11:15  I have reviewed the triage vital signs and the nursing notes.  History by: Patient along with a coworker  HISTORY  Chief Complaint Allergic Reaction  tightness in throat and hoarseness in voice    HPI Ashley [name] is a 64 y.o. 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 She reports she was feeling fine last night and this morning, however upon opening a package of some sort at the childcare facility, she began to feel tight in her throat and had hoarseness as well as.  She felt a little bit short of breath with this. She denies any swelling to her tongue or lips. She does report having had an allergic reaction in the past where she did have swelling to her lips and had been seen in the emergency department, treated, and had no further symptoms and no clear understanding of what triggered it.  These symptoms began 20 minutes prior to arrival. Upon arrival in the emergency department, the patient reports her throat began to "open up". She feels more comfortable now, though she still has hoarseness in her voice.    Past Medical History  Diagnosis Date  . Barrett's esophagus   . Gastritis   . Internal hemorrhoids   . Autoimmune liver disease   . Liver failure (HCC)   . Peptic ulcer disease   . Depression   . Small bowel obstruction (HCC)   . Anemia   . Migraine   . GERD (gastroesophageal reflux disease)   . Hypertension   . COPD (chronic obstructive pulmonary disease) (HCC)   . Insomnia   . Ovarian failure   . Attention deficit disorder     Patient Active Problem List   Diagnosis Date Noted  . Allergic rhinitis 09/19/2015  . Chronic kidney disease (CKD), stage III (moderate) 09/19/2015  . Chronic airway obstruction (HCC) 09/19/2015  . Arthritis of knee, degenerative 09/19/2015  . ADD (attention deficit disorder)  05/02/2015  . Special screening for malignant neoplasms, colon 02/02/2014  . LFTs abnormal 02/02/2014  . GERD (gastroesophageal reflux disease) 02/02/2014  . ADD (attention deficit hyperactivity disorder, inattentive type) 03/16/2010  . CONSTIPATION 04/22/2008  . Migraine 04/21/2008  . Essential hypertension 04/21/2008  . INTERNAL HEMORRHOIDS 04/21/2008  . CHRONIC OBSTRUCTIVE PULMONARY DISEASE 04/21/2008  . GERD 04/21/2008  . BARRETTS ESOPHAGUS 04/21/2008  . HEPATIC FAILURE 04/21/2008  . DEPRESSION, HX OF 04/21/2008  . ANEMIA, HX OF 04/21/2008  . PEPTIC ULCER DISEASE, HX OF 04/21/2008  . SMALL BOWEL OBSTRUCTION, HX OF 04/21/2008  . Benign essential HTN 01/26/2008  . Cannot sleep 01/26/2008  . Migraine with aura 01/26/2008    Past Surgical History  Procedure Laterality Date  . Oophorectomy    . Abdominal hysterectomy    . Dilation and curettage of uterus      Current Outpatient Rx  Name  Route  Sig  Dispense  Refill  . atenolol (TENORMIN) 100 MG tablet   Oral   Take 100 mg by mouth daily.         . fluticasone (FLONASE) 50 MCG/ACT nasal spray               . lisdexamfetamine (VYVANSE) 60 MG capsule   Oral   Take 1 capsule (60 mg total) by mouth daily. rf 10/19/15   30 capsule   0     1   . loratadine (CLARITIN) 10 MG tablet  Oral   Take 10 mg by mouth daily.         Marland Kitchen losartan (COZAAR) 50 MG tablet      TAKE ONE TABLET BY MOUTH EVERY DAY   30 tablet   5   . traMADol (ULTRAM) 50 MG tablet   Oral   Take 1 tablet (50 mg total) by mouth every 6 (six) hours as needed.   90 tablet   0   . omeprazole (PRILOSEC) 40 MG capsule   Oral   Take 1 capsule (40 mg total) by mouth daily.   30 capsule   3   . predniSONE (DELTASONE) 20 MG tablet      Take 2 tablets the first day, then take one tablet a day for 4 more days.   6 tablet   0   . SUMAtriptan (IMITREX) 100 MG tablet      TAKE 1 TABLET AT ONSET OF HEADACHE MAY REPEAT IN 2 HOURS   18 tablet    2     PLEASE SEND REFILLSTHANK YOU   . SUMAtriptan (IMITREX) 20 MG/ACT nasal spray      TAKE 1 NASAL SOLUTION AT FIRST ONSET OF MIGRAINE MAY REPEAT IN 2 HOURS   3 Inhaler   3     PLEASE SEND REFILLSTHANK YOU     Allergies Topamax  Family History  Problem Relation Age of Onset  . Heart failure Father   . Colon cancer Neg Hx     Social History Social History  Substance Use Topics  . Smoking status: Former Smoker    Quit date: 11/05/1988  . Smokeless tobacco: Never Used  . Alcohol Use: Yes     Comment: 1 glass on weekends    Review of Systems  Constitutional: Negative for fever/chills. ENT: Tightness in throat. See history of present illness Cardiovascular: Negative for chest pain. Respiratory: Negative for cough. Gastrointestinal: Negative for abdominal pain, vomiting and diarrhea. Patient does have a remote history of liver failure in 1990. Genitourinary: Negative for dysuria. Musculoskeletal: No back pain. Skin: Negative for rash. Neurological: Negative for headache or focal weakness   10-point ROS otherwise negative.  ____________________________________________   PHYSICAL EXAM:  VITAL SIGNS: ED Triage Vitals  Enc Vitals Group     BP --      Pulse Rate 10/27/15 1011 74     Resp 10/27/15 1011 20     Temp 10/27/15 1011 98.4 F (36.9 C)     Temp Source 10/27/15 1011 Oral     SpO2 10/27/15 1011 96 %     Weight 10/27/15 1011 148 lb (67.132 kg)     Height 10/27/15 1011  (1.626 m)     Head Cir --      Peak Flow --      Pain Score --      Pain Loc --      Pain Edu? --      Excl. in GC? --     Constitutional: Alert and oriented. Well appearing and in no distress, but with hoarse voice ENT   Head: Normocephalic and atraumatic.   Nose: No congestion/rhinnorhea.       Mouth: No erythema, no swelling        Neck: No stridor. No swelling or abnormality noted. 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 Cardiovascular: Normal rate,  regular rhythm, no murmur noted Respiratory:  Normal respiratory effort, no tachypnea.    Breath sounds are clear and equal bilaterally.  Gastrointestinal: Soft, no distention. Nontender Back: No muscle spasm, no tenderness, no CVA tenderness. Musculoskeletal: No deformity noted. Nontender with normal range of motion in all extremities.  No noted edema. Neurologic:  Communicative. Normal appearing spontaneous movement in all 4 extremities. No gross focal neurologic deficits are appreciated.  Skin:  Skin is warm, dry. No rash noted. Psychiatric: Mood and affect are normal. Speech and behavior are normal.  ____________________________________________    LABS (pertinent positives/negatives)  Labs Reviewed  CBC WITH DIFFERENTIAL/PLATELET - Abnormal; Notable for the following:    MCV 102.7 (*)    All other components within normal limits  COMPREHENSIVE METABOLIC PANEL - Abnormal; Notable for the following:    Total Protein 8.4 (*)    All other components within normal limits     ____________________________________________   EKG  ED ECG REPORT I, Mellanie Bejarano W, the attending physician, personally viewed and interpreted this ECG.   Date: 10/27/2015  EKG Time: 10:27 AM  Rate: 68  Rhythm:  Normal sinus rhythm  Axis: Normal  Intervals: Normal  ST&T Change: None noted   ____________________________________________    PROCEDURES    ____________________________________________   INITIAL IMPRESSION / ASSESSMENT AND PLAN / ED COURSE  Pertinent labs & imaging results that were available during my care of the patient were reviewed by me and considered in my medical decision making (see chart for details).  Pleasant, alert, 64 year old female in no acute distress, though she does continue to have a bit of hoarseness in her voice. We will treat her with racemic epinephrine as well as Decadron and observe her in the emergency department. The patient has a remote history of  liver failure, in 1990, and also reports that she has recently been diagnosed with some fatty liver. We will assess liver enzymes and basic labs. We'll observe her over the next 1-2 hours.  ----------------------------------------- 1:03 PM on 10/27/2015 -----------------------------------------  Blood tests appear to be normal, including liver enzymes. Reassessment of the patient after racemic epi and Decadron finds her significant improved. She feels comfortable. She is in no distress. She is breathing well. Her voice sounds more normal. She is relieved and is very appreciative of the care provided. We'll discharge home to follow up with her primary physician or with ENT if she has any ongoing symptoms. I will write a perception for prednisone to begin if she has lingering symptoms or any return of symptoms.  ____________________________________________   FINAL CLINICAL IMPRESSION(S) / ED DIAGNOSES  Final diagnoses:  Disorder of upper airway  Laryngeal spasm      Darien Ramusavid W Lubertha Leite, MD 10/27/15 1310  Darien Ramusavid W Ladina Shutters, MD 10/27/15 1311

## 2015-10-27 NOTE — Progress Notes (Signed)
This note also relates to the following rows which could not be included: SpO2 - Cannot attach notes to unvalidated device data     10/27/15 1139  Aerosol Therapy Tx  Medications Duoneb  Delivery Source Oxygen  Delivery Device HHN  Pre-Treatment Pulse 62  Pre-Treatment Respirations 16  Treatment Tolerance Tolerated well  Treatment Given 1  RT Breath Sounds  Bilateral Breath Sounds Clear;Diminished  Oxygen Therapy/Pulse Ox  O2 Device Room Air  patient moving good air via throat auscultation.

## 2015-10-27 NOTE — ED Notes (Signed)
Pt reports that she is having an allergic reaction. Unsure to what. Hoarseness in voice and tightness in chest. Pt in NAD.

## 2015-11-26 ENCOUNTER — Other Ambulatory Visit: Payer: Self-pay | Admitting: Family Medicine

## 2015-12-14 ENCOUNTER — Other Ambulatory Visit: Payer: Self-pay | Admitting: Family Medicine

## 2015-12-26 ENCOUNTER — Other Ambulatory Visit: Payer: Self-pay | Admitting: Family Medicine

## 2015-12-26 DIAGNOSIS — F988 Other specified behavioral and emotional disorders with onset usually occurring in childhood and adolescence: Secondary | ICD-10-CM

## 2015-12-26 MED ORDER — LISDEXAMFETAMINE DIMESYLATE 60 MG PO CAPS: 60.0000 mg | ORAL_CAPSULE | Freq: Every day | ORAL | Status: DC

## 2016-01-17 ENCOUNTER — Other Ambulatory Visit: Payer: Self-pay

## 2016-01-17 DIAGNOSIS — F988 Other specified behavioral and emotional disorders with onset usually occurring in childhood and adolescence: Secondary | ICD-10-CM

## 2016-01-17 MED ORDER — LISDEXAMFETAMINE DIMESYLATE 60 MG PO CAPS: 60.0000 mg | ORAL_CAPSULE | Freq: Every day | ORAL | Status: DC

## 2016-01-20 ENCOUNTER — Other Ambulatory Visit: Payer: Self-pay

## 2016-01-20 DIAGNOSIS — F988 Other specified behavioral and emotional disorders with onset usually occurring in childhood and adolescence: Secondary | ICD-10-CM

## 2016-01-20 MED ORDER — LISDEXAMFETAMINE DIMESYLATE 60 MG PO CAPS: 60.0000 mg | ORAL_CAPSULE | Freq: Every day | ORAL | Status: DC

## 2016-01-30 ENCOUNTER — Other Ambulatory Visit: Payer: Self-pay | Admitting: Family Medicine

## 2016-01-31 ENCOUNTER — Encounter: Payer: Self-pay | Admitting: Internal Medicine

## 2016-02-06 ENCOUNTER — Ambulatory Visit: Payer: Managed Care, Other (non HMO) | Admitting: Family Medicine

## 2016-03-22 ENCOUNTER — Telehealth: Payer: Self-pay | Admitting: Family Medicine

## 2016-03-22 DIAGNOSIS — F988 Other specified behavioral and emotional disorders with onset usually occurring in childhood and adolescence: Secondary | ICD-10-CM

## 2016-03-22 MED ORDER — LISDEXAMFETAMINE DIMESYLATE 60 MG PO CAPS: 60.0000 mg | ORAL_CAPSULE | Freq: Every day | ORAL | Status: DC

## 2016-03-22 NOTE — Telephone Encounter (Signed)
Dr Thana AtesMorrisey Patient: Patient has appointment with you for 04-10-16 but will be completely out of Vyvanse within 2 days. Is it possible to give her enough to last until appointment date. She was not able to get in any sooner. (C) [phone]) [phone]

## 2016-03-22 NOTE — Telephone Encounter (Signed)
Last fill was 02/21/2016; last note reviewed; okay for Rx; Rx must be picked up

## 2016-03-23 NOTE — Telephone Encounter (Signed)
Thank you Dr Sherie DonLada. Left voice message informing patient.

## 2016-04-05 ENCOUNTER — Other Ambulatory Visit: Payer: Self-pay | Admitting: Family Medicine

## 2016-04-10 ENCOUNTER — Ambulatory Visit (INDEPENDENT_AMBULATORY_CARE_PROVIDER_SITE_OTHER): Payer: Managed Care, Other (non HMO) | Admitting: Family Medicine

## 2016-04-10 ENCOUNTER — Encounter: Payer: Self-pay | Admitting: Family Medicine

## 2016-04-10 VITALS — BP 118/78 | HR 67 | Temp 97.9°F | Resp 18 | Ht 65.0 in | Wt 143.4 lb

## 2016-04-10 DIAGNOSIS — F909 Attention-deficit hyperactivity disorder, unspecified type: Secondary | ICD-10-CM | POA: Diagnosis not present

## 2016-04-10 DIAGNOSIS — I10 Essential (primary) hypertension: Secondary | ICD-10-CM

## 2016-04-10 DIAGNOSIS — F988 Other specified behavioral and emotional disorders with onset usually occurring in childhood and adolescence: Secondary | ICD-10-CM

## 2016-04-10 DIAGNOSIS — Z79899 Other long term (current) drug therapy: Secondary | ICD-10-CM | POA: Diagnosis not present

## 2016-04-10 DIAGNOSIS — F901 Attention-deficit hyperactivity disorder, predominantly hyperactive type: Secondary | ICD-10-CM | POA: Diagnosis not present

## 2016-04-10 DIAGNOSIS — G43101 Migraine with aura, not intractable, with status migrainosus: Secondary | ICD-10-CM

## 2016-04-10 MED ORDER — LISDEXAMFETAMINE DIMESYLATE 50 MG PO CAPS: 50.0000 mg | ORAL_CAPSULE | Freq: Every day | ORAL | Status: DC

## 2016-04-10 MED ORDER — LISDEXAMFETAMINE DIMESYLATE 60 MG PO CAPS: 60.0000 mg | ORAL_CAPSULE | Freq: Every day | ORAL | Status: DC

## 2016-04-10 MED ORDER — SUMATRIPTAN SUCCINATE 100 MG PO TABS: 100.0000 mg | ORAL_TABLET | Freq: Once | ORAL | Status: DC

## 2016-04-10 MED ORDER — SUMATRIPTAN 20 MG/ACT NA SOLN: 20.0000 mg | Freq: Once | NASAL | Status: DC

## 2016-04-10 MED ORDER — ATENOLOL 100 MG PO TABS: 100.0000 mg | ORAL_TABLET | Freq: Two times a day (BID) | ORAL | Status: DC

## 2016-04-10 NOTE — Patient Instructions (Addendum)
Start coated aspirin 81 mg daily for stroke prevention  OHIO = only handle it once  Check out the [facility] books  Return in a little over 3 months

## 2016-04-10 NOTE — Assessment & Plan Note (Signed)
Encouraged her to check out books by [facility] and [facility]; try "OHIO"; 3 months of medicine prescribed with appropriate fill on or after dates; UDS per protocol; do not take other stimulants; controlled substance contract today; return in a little over 3 months

## 2016-04-10 NOTE — Progress Notes (Signed)
BP 118/78 mmHg  Pulse 67  Temp(Src) 97.9 F (36.6 C)  Resp 18  Ht  (1.651 m)  Wt 143 lb 7 oz (65.063 kg)  BMI 23.87 kg/m2  SpO2 95%   Subjective:    Patient ID: Ashley [name], female    DOB: May 11, 1951, 65 y.o.   MRN: [id number]  HPI: Ashley [name] is a 65 y.o. female  Chief Complaint  Patient presents with  . 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 Barrett's esophagus   . Gastritis   . Internal hemorrhoids   . Autoimmune liver disease   . Liver failure (HCC)   . Peptic ulcer disease   . Depression   . Small bowel obstruction (HCC)   . Anemia   . Migraine   . GERD (gastroesophageal reflux disease)   . Hypertension   . COPD (chronic obstructive pulmonary disease) (HCC)   . Insomnia   . Ovarian failure   . Attention deficit disorder   . ADHD (attention deficit hyperactivity disorder) 05/02/2015   Past Surgical History  Procedure Laterality Date  . Oophorectomy    . Abdominal hysterectomy    . Dilation and curettage of uterus     Family History  Problem Relation Age of Onset  . Heart failure Father   . Colon cancer Neg Hx    Social  History  Substance Use Topics  . Smoking status: Former Smoker    Quit date: 11/05/1988  . Smokeless tobacco: Never Used  . Alcohol Use: Yes     Comment: 1 glass on weekends    Interim medical history since last visit reviewed. Allergies and medications reviewed  Review of Systems Per HPI unless specifically indicated above     Objective:    BP 118/78 mmHg  Pulse 67  Temp(Src) 97.9 F (36.6 C)  Resp 18  Ht  (1.651 m)  Wt 143 lb 7 oz (65.063 kg)  BMI 23.87 kg/m2  SpO2 95%  Wt Readings from Last 3 Encounters:  04/10/16 143 lb 7 oz (65.063 kg)  10/27/15 148 lb (67.132 kg)  09/19/15 152 lb 8 oz (69.174 kg)    Physical Exam  Constitutional: She appears well-developed and well-nourished. No distress.  Eyes: EOM are normal. No scleral icterus.  Neck: No JVD present.  Cardiovascular: Normal rate and regular rhythm.   No extrasystoles are present.  Pulmonary/Chest: Effort normal and breath sounds normal.  Abdominal: She exhibits no distension.  Neurological: She displays no tremor.  Skin: She is not diaphoretic. No pallor.  Psychiatric: She has a normal mood and affect. Her behavior is  normal. Judgment and thought content normal. Her mood appears not anxious. Her speech is not rapid and/or pressured. Cognition and memory are normal.  High energy, very pleasant, cooperative; active, some movement seated in chair      Assessment & Plan:   Problem List Items Addressed This Visit      Cardiovascular and Mediastinum   Benign essential HTN - Primary    On max dose of beta-blocker and doing well; heart rate good; needs refills, provided; she is aware atenolol is max dose      Relevant Medications   atenolol (TENORMIN) 100 MG tablet   Migraine with aura    triptan refilled; BP controlled; discussed risk of stroke, start 81 mg coated aspirin daily      Relevant Medications   atenolol (TENORMIN) 100 MG tablet   SUMAtriptan (IMITREX) 20 MG/ACT nasal spray   SUMAtriptan  (IMITREX) 100 MG tablet     Other   ADHD (attention deficit hyperactivity disorder)    Encouraged her to check out books by [facility] and [facility]; try "OHIO"; 3 months of medicine prescribed with appropriate fill on or after dates; UDS per protocol; do not take other stimulants; controlled substance contract today; return in a little over 3 months      Relevant Medications   lisdexamfetamine (VYVANSE) 60 MG capsule   Controlled substance agreement signed    Typical speech given; agreement given to patient, copy for her per staff; UDS per protocol         Follow up plan: Return in about 3 months (around 07/18/2016).  An after-visit summary was printed and given to the patient at check-out.  Please see the patient instructions which may contain other information and recommendations beyond what is mentioned above in the assessment and plan.  Meds ordered this encounter  Medications  . atenolol (TENORMIN) 100 MG tablet    Sig: Take 1 tablet (100 mg total) by mouth every 12 (twelve) hours.    Dispense:  60 tablet    Refill:  6  . SUMAtriptan (IMITREX) 20 MG/ACT nasal spray    Sig: Place 1 spray (20 mg total) into the nose once. May repeat in 2 hours if headache persists or recurs.    Dispense:  5 Inhaler    Refill:  3    PLEASE SEND REFILLSTHANK YOU  . SUMAtriptan (IMITREX) 100 MG tablet    Sig: Take 1 tablet (100 mg total) by mouth once. May repeat in 2 hours if headache persists or recurs.    Dispense:  9 tablet    Refill:  3    PLEASE SEND REFILLSTHANK YOU  . lisdexamfetamine (VYVANSE) 60 MG capsule    Sig: Take 1 capsule (60 mg total) by mouth daily.    Dispense:  30 capsule    Refill:  0    Fill on or after April 21, 2016  . lisdexamfetamine (VYVANSE) 50 MG capsule    Sig: Take 1 capsule (50 mg total) by mouth daily.    Dispense:  30 capsule    Refill:  0    Fill on or after May 21, 2016  . lisdexamfetamine (VYVANSE) 50 MG capsule    Sig: Take 1 capsule (50 mg total) by  mouth daily.    Dispense:  30 capsule    Refill:  0    Fill on or after June 20, 2016

## 2016-04-10 NOTE — Assessment & Plan Note (Addendum)
On max dose of beta-blocker and doing well; heart rate good; needs refills, provided; she is aware atenolol is max dose

## 2016-04-10 NOTE — Assessment & Plan Note (Signed)
Typical speech given; agreement given to patient, copy for her per staff; UDS per protocol

## 2016-04-10 NOTE — Assessment & Plan Note (Signed)
triptan refilled; BP controlled; discussed risk of stroke, start 81 mg coated aspirin daily

## 2016-04-13 ENCOUNTER — Telehealth: Payer: Self-pay | Admitting: Family Medicine

## 2016-04-13 NOTE — Telephone Encounter (Signed)
[facility] from [facility] requesting return call: states you wanted to order additional labs but you filled out the paperwork incorrectly.  [phone]

## 2016-04-23 ENCOUNTER — Encounter: Payer: Self-pay | Admitting: Family Medicine

## 2016-05-21 ENCOUNTER — Other Ambulatory Visit: Payer: Self-pay | Admitting: Family Medicine

## 2016-05-21 ENCOUNTER — Telehealth: Payer: Self-pay

## 2016-05-21 DIAGNOSIS — F988 Other specified behavioral and emotional disorders with onset usually occurring in childhood and adolescence: Secondary | ICD-10-CM

## 2016-05-21 MED ORDER — LISDEXAMFETAMINE DIMESYLATE 60 MG PO CAPS: 60.0000 mg | ORAL_CAPSULE | Freq: Every day | ORAL | Status: DC

## 2016-05-21 NOTE — Progress Notes (Signed)
Patient returned two prescriptions for Vyvanse 50 mg Incorrect strength I printed out two new prescriptions for the 60 mg strength Ready for pick up

## 2016-05-21 NOTE — Telephone Encounter (Signed)
eroor

## 2016-07-02 ENCOUNTER — Other Ambulatory Visit: Payer: Self-pay | Admitting: Family Medicine

## 2016-07-07 ENCOUNTER — Other Ambulatory Visit: Payer: Self-pay | Admitting: Family Medicine

## 2016-07-19 ENCOUNTER — Ambulatory Visit: Payer: Managed Care, Other (non HMO) | Admitting: Family Medicine

## 2016-07-20 ENCOUNTER — Ambulatory Visit: Payer: Managed Care, Other (non HMO) | Admitting: Family Medicine

## 2016-08-06 ENCOUNTER — Ambulatory Visit: Payer: Managed Care, Other (non HMO) | Admitting: Family Medicine

## 2016-08-10 NOTE — Telephone Encounter (Signed)
Pharmacy requesting refill on Losartan. She was completely out and they gave her 3 pills yesterday to last her through the weekend. [facility]

## 2016-08-11 NOTE — Telephone Encounter (Signed)
Labs done in December 2016; normal Cr and K+; Rx for ARB approved Patient has canceled 3 appts in a row; just documenting that she'll need to be seen for an appt for any Vyvanse or other controlled substance per our protocol

## 2016-08-23 ENCOUNTER — Telehealth: Payer: Self-pay | Admitting: Family Medicine

## 2016-08-23 DIAGNOSIS — F988 Other specified behavioral and emotional disorders with onset usually occurring in childhood and adolescence: Secondary | ICD-10-CM

## 2016-08-23 NOTE — Telephone Encounter (Signed)
I'm so sorry, but she needs to be seen every 3 months for this controlled substance It looks like she canceled appts on Sept 14th, Sept 15th, October 2nd, and Oct 31st I won't be able to refill this until her appointment Thank you

## 2016-08-23 NOTE — Telephone Encounter (Signed)
Patient scheduled appointment for 09-10-16. She is completely out of vyvance 60mg  and is asking that you please give her enough to last until appointment date. This was the soonest that she was able to get in to see you.

## 2016-08-24 NOTE — Telephone Encounter (Signed)
Called pt 2nd time and left a Voicemail stating she will have to see Dr.lada before she can prescribe any controlled substance.

## 2016-08-24 NOTE — Telephone Encounter (Signed)
Left a detail Voicemail stating she needs to schedule a appointment with us. Gave PT call back number.

## 2016-08-24 NOTE — Telephone Encounter (Signed)
[facility] but wanted me to send another message explaining why she had cancelled her other appointments. states that one visit was cancelled due to her having stomach virus. The other time was due to jury duty and her having to take her daughter to an appointment that could not be missed and she apologized. States that she does have appointment coming up in November.

## 2016-09-04 ENCOUNTER — Ambulatory Visit: Payer: Managed Care, Other (non HMO) | Admitting: Family Medicine

## 2016-09-10 ENCOUNTER — Encounter: Payer: Self-pay | Admitting: Family Medicine

## 2016-09-10 ENCOUNTER — Ambulatory Visit (INDEPENDENT_AMBULATORY_CARE_PROVIDER_SITE_OTHER): Payer: Managed Care, Other (non HMO) | Admitting: Family Medicine

## 2016-09-10 DIAGNOSIS — R252 Cramp and spasm: Secondary | ICD-10-CM | POA: Diagnosis not present

## 2016-09-10 DIAGNOSIS — F902 Attention-deficit hyperactivity disorder, combined type: Secondary | ICD-10-CM

## 2016-09-10 DIAGNOSIS — D7589 Other specified diseases of blood and blood-forming organs: Secondary | ICD-10-CM | POA: Diagnosis not present

## 2016-09-10 DIAGNOSIS — J3089 Other allergic rhinitis: Secondary | ICD-10-CM | POA: Diagnosis not present

## 2016-09-10 DIAGNOSIS — G43101 Migraine with aura, not intractable, with status migrainosus: Secondary | ICD-10-CM

## 2016-09-10 DIAGNOSIS — F901 Attention-deficit hyperactivity disorder, predominantly hyperactive type: Secondary | ICD-10-CM | POA: Diagnosis not present

## 2016-09-10 DIAGNOSIS — Z5181 Encounter for therapeutic drug level monitoring: Secondary | ICD-10-CM | POA: Diagnosis not present

## 2016-09-10 DIAGNOSIS — Z79899 Other long term (current) drug therapy: Secondary | ICD-10-CM

## 2016-09-10 MED ORDER — LISDEXAMFETAMINE DIMESYLATE 60 MG PO CAPS: 60.0000 mg | ORAL_CAPSULE | ORAL | 0 refills | Status: DC

## 2016-09-10 MED ORDER — LISDEXAMFETAMINE DIMESYLATE 60 MG PO CAPS: 60.0000 mg | ORAL_CAPSULE | Freq: Every day | ORAL | 0 refills | Status: DC

## 2016-09-10 MED ORDER — FLUTICASONE PROPIONATE 50 MCG/ACT NA SUSP: 2.0000 | Freq: Every day | NASAL | 11 refills | Status: DC

## 2016-09-10 MED ORDER — LOSARTAN POTASSIUM 50 MG PO TABS: 50.0000 mg | ORAL_TABLET | Freq: Every day | ORAL | 1 refills | Status: DC

## 2016-09-10 NOTE — Progress Notes (Signed)
BP 120/82   Pulse 65   Temp 97.9 F (36.6 C) (Oral)   Resp 14   Wt 149 lb (67.6 kg)   SpO2 92%   BMI 24.79 kg/m    Subjective:    Patient ID: Ashley [name], female    DOB: 03-27-1951, 65 y.o.   MRN: [id number]  HPI: Ashley [name] is a 65 y.o. female  Chief Complaint  Patient presents with  . Follow-up  . Medication Refill   She has ADHD; she dropped a pill in her purse and found one left-over and was able to take a Vyvanse because she had a really busy day Trouble focusing Has been working every day She had to deal with this for years before she ever started medicine Able to start lots of tasks without the medicine but having trouble completing tasks No bad side effects taking the medicine; no chest pain or palpitations; no headaches on the medicine Long-term problem with sleep; wakes up frequently; "that's just me" Migraine with aura; used to get 9 per month HTN; without medicine for 3 days; tries to stay away from too much extra salt; ; knows to stay away from decongestants Allergies; uses nasal spray, plain antihistamine  Depression screen [facility] 2/9 09/10/2016 09/19/2015 05/02/2015  Decreased Interest 0 0 0  Down, Depressed, Hopeless 0 0 0  PHQ - 2 Score 0 0 0   Relevant past medical, surgical, family and social history reviewed Past Medical History:  Diagnosis Date  . ADHD (attention deficit hyperactivity disorder) 05/02/2015  . Anemia   . Attention deficit disorder   . Autoimmune liver disease   . Barrett's esophagus   . COPD (chronic obstructive pulmonary disease) (HCC)   . Depression   . Gastritis   . GERD (gastroesophageal reflux disease)   . Hypertension   . Insomnia   . Internal hemorrhoids   . Liver failure (HCC)   . Migraine   . Ovarian failure   . Peptic ulcer disease   . Small bowel obstruction    Past Surgical History:  Procedure Laterality Date  . ABDOMINAL HYSTERECTOMY    . DILATION AND CURETTAGE OF UTERUS    . OOPHORECTOMY     Family  History  Problem Relation Age of Onset  . Heart failure Father   . Colon cancer Neg Hx    Social History  Substance Use Topics  . Smoking status: Former Smoker    Quit date: 11/05/1988  . Smokeless tobacco: Never Used  . Alcohol use Yes     Comment: 1 glass on weekends   Interim medical history since last visit reviewed. Allergies and medications reviewed  Review of Systems Per HPI unless specifically indicated above     Objective:    BP 120/82   Pulse 65   Temp 97.9 F (36.6 C) (Oral)   Resp 14   Wt 149 lb (67.6 kg)   SpO2 92%   BMI 24.79 kg/m   Wt Readings from Last 3 Encounters:  09/10/16 149 lb (67.6 kg)  04/10/16 143 lb 7 oz (65.1 kg)  10/27/15 148 lb (67.1 kg)    Physical Exam  Constitutional: She appears well-developed and well-nourished.  HENT:  Head: Normocephalic and atraumatic.  Eyes: EOM are normal. No scleral icterus.  Neck: No thyromegaly present.  Cardiovascular: Normal rate and regular rhythm.   No extrasystoles are present.  Pulmonary/Chest: Effort normal and breath sounds normal.  Abdominal: She exhibits no distension.  Musculoskeletal: She exhibits no  edema.  Neurological: She is alert. She displays no tremor.  Reflex Scores:      Patellar reflexes are 2+ on the right side and 2+ on the left side. No tics  Skin: Skin is warm. No pallor.  Psychiatric: She has a normal mood and affect. Her behavior is normal. Judgment and thought content normal. Her mood appears not anxious. Her speech is not rapid and/or pressured. Cognition and memory are normal.  Engaging, active, upbeat, good eye contact with examiner   Results for orders placed or performed during the hospital encounter of 10/27/15  CBC with Differential  Result Value Ref Range   WBC 5.1 3.6 - 11.0 K/uL   RBC 4.25 3.80 - 5.20 MIL/uL   Hemoglobin 14.4 12.0 - 16.0 g/dL   HCT 45.443.7 09.835.0 - 11.947.0 %   MCV 102.7 (H) 80.0 - 100.0 fL   MCH 33.9 26.0 - 34.0 pg   MCHC 33.0 32.0 - 36.0 g/dL   RDW  14.713.8 82.911.5 - 56.214.5 %   Platelets 300 150 - 440 K/uL   Neutrophils Relative % 69 %   Neutro Abs 3.6 1.4 - 6.5 K/uL   Lymphocytes Relative 20 %   Lymphs Abs 1.0 1.0 - 3.6 K/uL   Monocytes Relative 7 %   Monocytes Absolute 0.4 0.2 - 0.9 K/uL   Eosinophils Relative 3 %   Eosinophils Absolute 0.1 0 - 0.7 K/uL   Basophils Relative 1 %   Basophils Absolute 0.0 0 - 0.1 K/uL  Comprehensive metabolic panel  Result Value Ref Range   Sodium 139 135 - 145 mmol/L   Potassium 4.4 3.5 - 5.1 mmol/L   Chloride 101 101 - 111 mmol/L   CO2 28 22 - 32 mmol/L   Glucose, Bld 84 65 - 99 mg/dL   BUN 18 6 - 20 mg/dL   Creatinine, Ser 1.300.69 0.44 - 1.00 mg/dL   Calcium 9.3 8.9 - 86.510.3 mg/dL   Total Protein 8.4 (H) 6.5 - 8.1 g/dL   Albumin 4.5 3.5 - 5.0 g/dL   AST 37 15 - 41 U/L   ALT 28 14 - 54 U/L   Alkaline Phosphatase 89 38 - 126 U/L   Total Bilirubin 0.9 0.3 - 1.2 mg/dL   GFR calc non Af Amer >60 >60 mL/min   GFR calc Af Amer >60 >60 mL/min   Anion gap 10 5 - 15      Assessment & Plan:   Problem List Items Addressed This Visit      Cardiovascular and Mediastinum   Migraine with aura    Discussed risk of stroke; s/s reviewed; call 911 if ever happens      Relevant Medications   losartan (COZAAR) 50 MG tablet     Respiratory   Allergic rhinitis    Continue plain nasal spray and antihistamine        Other   Medication monitoring encounter    Check liver and kidneys      Relevant Orders   COMPLETE METABOLIC PANEL WITH GFR   Macrocytosis without anemia    Check CBC and folic acid and B12      Relevant Orders   CBC with Differential/Platelet   Folate   Vitamin B12   Hand cramps    Check K+ and Mg2+      Relevant Orders   Magnesium   Controlled substance agreement signed    I personally reviewed the [place]CCS[place] web site; no red flags  ADHD (attention deficit hyperactivity disorder)    Start back on Vyvanse      Relevant Medications   lisdexamfetamine (VYVANSE) 60 MG  capsule       Follow up plan: Return in about 3 months (around 12/07/2016) for medicine management.  An after-visit summary was printed and given to the patient at check-out.  Please see the patient instructions which may contain other information and recommendations beyond what is mentioned above in the assessment and plan.  Meds ordered this encounter  Medications  . fluticasone (FLONASE) 50 MCG/ACT nasal spray    Sig: Place 2 sprays into both nostrils daily.    Dispense:  16 g    Refill:  11    FOR NEXT FILL. THANK YOU  . losartan (COZAAR) 50 MG tablet    Sig: Take 1 tablet (50 mg total) by mouth daily.    Dispense:  30 tablet    Refill:  1  . lisdexamfetamine (VYVANSE) 60 MG capsule    Sig: Take 1 capsule (60 mg total) by mouth daily.    Dispense:  30 capsule    Refill:  0    Fill now #1  . lisdexamfetamine (VYVANSE) 60 MG capsule    Sig: Take 1 capsule (60 mg total) by mouth every morning.    Dispense:  30 capsule    Refill:  0    Fill on or after October 08, 2016  #2  . lisdexamfetamine (VYVANSE) 60 MG capsule    Sig: Take 1 capsule (60 mg total) by mouth every morning.    Dispense:  30 capsule    Refill:  0    Fill on or after November 06, 2016    Orders Placed This Encounter  Procedures  . Magnesium  . CBC with Differential/Platelet  . COMPLETE METABOLIC PANEL WITH GFR  . Folate  . Vitamin B12

## 2016-09-10 NOTE — Assessment & Plan Note (Signed)
Check liver and kidneys 

## 2016-09-10 NOTE — Assessment & Plan Note (Signed)
Continue plain nasal spray and antihistamine

## 2016-09-10 NOTE — Assessment & Plan Note (Signed)
Discussed risk of stroke; s/s reviewed; call 911 if ever happens

## 2016-09-10 NOTE — Patient Instructions (Signed)
Please do have fasting labs done in the next week or two Try [place] = only handle it once Return in just under 3 months for next medicine management visit

## 2016-09-10 NOTE — Assessment & Plan Note (Signed)
Check K+ and Mg2+ 

## 2016-09-10 NOTE — Assessment & Plan Note (Signed)
I personally reviewed the [place]CCSRS web site; no red flags

## 2016-09-10 NOTE — Assessment & Plan Note (Signed)
Start back on Vyvanse

## 2016-09-10 NOTE — Assessment & Plan Note (Signed)
Check CBC and folic acid and B12

## 2016-09-25 ENCOUNTER — Other Ambulatory Visit: Payer: Self-pay | Admitting: Family Medicine

## 2016-09-26 LAB — MAGNESIUM: MAGNESIUM: 1.8 mg/dL (ref 1.5–2.5)

## 2016-09-26 LAB — COMPLETE METABOLIC PANEL WITH GFR
ALT: 18 U/L (ref 6–29)
AST: 33 U/L (ref 10–35)
Albumin: 4.3 g/dL (ref 3.6–5.1)
Alkaline Phosphatase: 64 U/L (ref 33–130)
BUN: 19 mg/dL (ref 7–25)
CHLORIDE: 99 mmol/L (ref 98–110)
CO2: 31 mmol/L (ref 20–31)
Calcium: 9.7 mg/dL (ref 8.6–10.4)
Creat: 0.87 mg/dL (ref 0.50–0.99)
GFR, EST NON AFRICAN AMERICAN: 71 mL/min (ref >=60)
GFR, Est African American: 82 mL/min (ref >=60)
GLUCOSE: 95 mg/dL (ref 65–99)
POTASSIUM: 4.8 mmol/L (ref 3.5–5.3)
SODIUM: 138 mmol/L (ref 135–146)
Total Bilirubin: 0.6 mg/dL (ref 0.2–1.2)
Total Protein: 7.6 g/dL (ref 6.1–8.1)

## 2016-09-26 LAB — CBC WITH DIFFERENTIAL/PLATELET
Basophils Absolute: 37 cells/uL (ref 0–200)
Basophils Relative: 1 %
EOS ABS: 37 {cells}/uL (ref 15–500)
EOS PCT: 1 %
HEMATOCRIT: 41.3 % (ref 35.0–45.0)
HEMOGLOBIN: 13.4 g/dL (ref 11.7–15.5)
LYMPHS ABS: 962 {cells}/uL (ref 850–3900)
Lymphocytes Relative: 26 %
MCH: 33.8 pg — AB (ref 27.0–33.0)
MCHC: 32.4 g/dL (ref 32.0–36.0)
MCV: 104.3 fL — AB (ref 80.0–100.0)
MONO ABS: 407 {cells}/uL (ref 200–950)
MPV: 9.8 fL (ref 7.5–12.5)
Monocytes Relative: 11 %
NEUTROS ABS: 2257 {cells}/uL (ref 1500–7800)
NEUTROS PCT: 61 %
Platelets: 288 10*3/uL (ref 140–400)
RBC: 3.96 MIL/uL (ref 3.80–5.10)
RDW: 14.1 % (ref 11.0–15.0)
WBC: 3.7 10*3/uL — ABNORMAL LOW (ref 3.8–10.8)

## 2016-09-26 LAB — VITAMIN B12: Vitamin B-12: 481 pg/mL (ref 200–1100)

## 2016-09-26 LAB — FOLATE: Folate: 14.7 ng/mL (ref >5.4)

## 2016-10-02 ENCOUNTER — Telehealth: Payer: Self-pay | Admitting: Family Medicine

## 2016-10-02 NOTE — Telephone Encounter (Signed)
Please review

## 2016-10-02 NOTE — Telephone Encounter (Signed)
PT IS NEEDING RESULTS OF TEST. CAN LEAVE A MESSAGE

## 2016-10-03 ENCOUNTER — Other Ambulatory Visit: Payer: Self-pay | Admitting: Family Medicine

## 2016-10-03 DIAGNOSIS — D7589 Other specified diseases of blood and blood-forming organs: Secondary | ICD-10-CM

## 2016-10-03 NOTE — Telephone Encounter (Signed)
I responded through MyChart.

## 2016-10-03 NOTE — Assessment & Plan Note (Signed)
Check UPEP, SPEP

## 2016-10-03 NOTE — Progress Notes (Signed)
Macrocytosis; check UPEP and SPEP

## 2016-11-02 ENCOUNTER — Other Ambulatory Visit: Payer: Self-pay | Admitting: Family Medicine

## 2016-11-06 NOTE — Telephone Encounter (Signed)
Glitch in computer system; I was not getting refill requests; addressing today ---------- rx approved 

## 2016-12-09 ENCOUNTER — Telehealth: Payer: Self-pay | Admitting: Family Medicine

## 2016-12-09 NOTE — Telephone Encounter (Signed)
Please remind patient to have the labs done that were ordered in November; thank you

## 2016-12-10 NOTE — Telephone Encounter (Signed)
Left detailed voicemail

## 2016-12-11 ENCOUNTER — Ambulatory Visit (INDEPENDENT_AMBULATORY_CARE_PROVIDER_SITE_OTHER): Payer: Managed Care, Other (non HMO) | Admitting: Family Medicine

## 2016-12-11 ENCOUNTER — Encounter: Payer: Self-pay | Admitting: Family Medicine

## 2016-12-11 DIAGNOSIS — G43101 Migraine with aura, not intractable, with status migrainosus: Secondary | ICD-10-CM

## 2016-12-11 DIAGNOSIS — I10 Essential (primary) hypertension: Secondary | ICD-10-CM | POA: Diagnosis not present

## 2016-12-11 DIAGNOSIS — D7589 Other specified diseases of blood and blood-forming organs: Secondary | ICD-10-CM

## 2016-12-11 DIAGNOSIS — F902 Attention-deficit hyperactivity disorder, combined type: Secondary | ICD-10-CM | POA: Diagnosis not present

## 2016-12-11 MED ORDER — LISDEXAMFETAMINE DIMESYLATE 60 MG PO CAPS: 60.0000 mg | ORAL_CAPSULE | ORAL | 0 refills | Status: DC

## 2016-12-11 MED ORDER — SUMATRIPTAN SUCCINATE 100 MG PO TABS: 100.0000 mg | ORAL_TABLET | Freq: Once | ORAL | 3 refills | Status: DC

## 2016-12-11 MED ORDER — LISDEXAMFETAMINE DIMESYLATE 60 MG PO CAPS: 60.0000 mg | ORAL_CAPSULE | Freq: Every day | ORAL | 0 refills | Status: DC

## 2016-12-11 MED ORDER — SUMATRIPTAN 20 MG/ACT NA SOLN: 20.0000 mg | Freq: Once | NASAL | 3 refills | Status: DC

## 2016-12-11 NOTE — Patient Instructions (Addendum)
Try magnesium oxide 250 mg daily for migraine prevention Let's get labs today If you have not heard anything from my staff in a week about any orders/referrals/studies from today, please contact us here to follow-up [phone] [facility]

## 2016-12-11 NOTE — Progress Notes (Signed)
BP 120/80   Pulse 73   Temp 98.2 F (36.8 C) (Oral)   Resp 14   Wt 144 lb (65.3 kg)   SpO2 96%   BMI 23.96 kg/m    Subjective:    Patient ID: Ashley RoanDebra M Brinegar, female    DOB: 16-Dec-1950, 66 y.o.   MRN: [id number]  HPI: Ashley [name] is a 66 y.o. female  Chief Complaint  Patient presents with  . Follow-up    Medicine mangement    No medical excitement since last visit She is feeling well  ADHD; medicine is working well; ran out on February 2nd [employer] reviewed, no red flags  Migraines; not having them much; have really decreased in frequency since turning 62; perfume and different smells are a trigger  HTN; well-controlled; does not check BP away from doctor; knows to avoid salt, just adds a little bit; knows to avoid decongestants  Macrocytosis; normal B12 and folate; recheck today  [facility] 12/11/2016 09/10/2016 09/19/2015 05/02/2015  Decreased Interest 0 0 0 0  Down, Depressed, Hopeless 0 0 0 0  PHQ - 2 Score 0 0 0 0   Relevant past medical, surgical, family and social history reviewed Past Medical History:  Diagnosis Date  . ADHD (attention deficit hyperactivity disorder) 05/02/2015  . Anemia   . Attention deficit disorder   . Autoimmune liver disease   . Barrett's esophagus   . COPD (chronic obstructive pulmonary disease) (HCC)   . Depression   . Gastritis   . GERD (gastroesophageal reflux disease)   . Hypertension   . Insomnia   . Internal hemorrhoids   . Liver failure (HCC)   . Migraine   . Ovarian failure   . Peptic ulcer disease   . Small bowel obstruction    Past Surgical History:  Procedure Laterality Date  . ABDOMINAL HYSTERECTOMY    . DILATION AND CURETTAGE OF UTERUS    . OOPHORECTOMY     Family History  Problem Relation Age of Onset  . Heart failure Father   . Colon cancer Neg Hx    Social History  Substance Use Topics  . Smoking status: Former Smoker    Quit date: 11/05/1988  . Smokeless tobacco: Never Used    . Alcohol use Yes     Comment: 1 glass on weekends   Interim medical history since last visit reviewed. Allergies and medications reviewed  Review of Systems Per HPI unless specifically indicated above     Objective:    BP 120/80   Pulse 73   Temp 98.2 F (36.8 C) (Oral)   Resp 14   Wt 144 lb (65.3 kg)   SpO2 96%   BMI 23.96 kg/m   Wt Readings from Last 3 Encounters:  12/11/16 144 lb (65.3 kg)  09/10/16 149 lb (67.6 kg)  04/10/16 143 lb 7 oz (65.1 kg)    Physical Exam  Constitutional: She appears well-developed and well-nourished.  HENT:  Head: Normocephalic and atraumatic.  Eyes: EOM are normal. No scleral icterus.  Neck: No thyromegaly present.  Cardiovascular: Normal rate and regular rhythm.   No extrasystoles are present.  Pulmonary/Chest: Effort normal and breath sounds normal.  Abdominal: She exhibits no distension. There is no tenderness.  Musculoskeletal: She exhibits no edema.  Neurological: She is alert. She displays no tremor.  No tics  Skin: Skin is warm. No pallor.  No jaundice  Psychiatric: She has a normal mood and affect. Her behavior is  normal. Judgment and thought content normal. Her mood appears not anxious. Her speech is not rapid and/or pressured. Cognition and memory are normal.  Engaging, active, upbeat, good eye contact with examiner      Assessment & Plan:   Problem List Items Addressed This Visit      Cardiovascular and Mediastinum   Migraine with aura    Continue to avoid triggers; use medicine if needed; try magnesium      Benign essential HTN    Excellent control; continue meds; try to limit salt; follow DASH guidelines        Other   Macrocytosis without anemia    Normal B12 and folate last time; doubtful that current alcohol is the cause based on amount she reports; however, she brought alcohol up a few times and said that she would definitely cut back, so perhaps she is underreporting; check SPEP and UPEP and CBC       Relevant Orders   CBC with Differential/Platelet   Protein Electrophoresis, (serum) (Completed)   UPEP/UIFE/Light Chains/TP, 24-Hr Ur   Pathologist smear review   ADHD (attention deficit hyperactivity disorder)    Doing well on current medicine without adverse effects; NCCSRS website reviewed prior; three months of refills provided      Relevant Medications   lisdexamfetamine (VYVANSE) 60 MG capsule       Follow up plan: Return in about 3 months (around 03/10/2017) for complete physical in the next month or so.  An after-visit summary was printed and given to the patient at check-out.  Please see the patient instructions which may contain other information and recommendations beyond what is mentioned above in the assessment and plan.  Meds ordered this encounter  Medications  . lisdexamfetamine (VYVANSE) 60 MG capsule    Sig: Take 1 capsule (60 mg total) by mouth daily.    Dispense:  30 capsule    Refill:  0    Fill now #1  . lisdexamfetamine (VYVANSE) 60 MG capsule    Sig: Take 1 capsule (60 mg total) by mouth every morning.    Dispense:  30 capsule    Refill:  0    Fill on or after January 10, 2017, #2  . lisdexamfetamine (VYVANSE) 60 MG capsule    Sig: Take 1 capsule (60 mg total) by mouth every morning.    Dispense:  30 capsule    Refill:  0    Fill on or after February 09, 2017, Rx #3  . SUMAtriptan (IMITREX) 20 MG/ACT nasal spray    Sig: Place 1 spray (20 mg total) into the nose once. May repeat in 2 hours if headache persists or recurs.    Dispense:  5 Inhaler    Refill:  3    PLEASE SEND REFILLSTHANK YOU  . SUMAtriptan (IMITREX) 100 MG tablet    Sig: Take 1 tablet (100 mg total) by mouth once. May repeat in 2 hours if headache persists or recurs.    Dispense:  9 tablet    Refill:  3    PLEASE SEND REFILLSTHANK YOU    Orders Placed This Encounter  Procedures  . CBC with Differential/Platelet  . Protein Electrophoresis, (serum)  . UPEP/UIFE/Light Chains/TP, 24-Hr Ur    . Pathologist smear review  . Pathologist smear review

## 2016-12-11 NOTE — Assessment & Plan Note (Signed)
Excellent control; continue meds; try to limit salt; follow DASH guidelines

## 2016-12-11 NOTE — Assessment & Plan Note (Addendum)
Normal B12 and folate last time; doubtful that current alcohol is the cause based on amount she reports; however, she brought alcohol up a few times and said that she would definitely cut back, so perhaps she is underreporting; check SPEP and UPEP and CBC

## 2016-12-11 NOTE — Assessment & Plan Note (Addendum)
Continue to avoid triggers; use medicine if needed; try magnesium

## 2016-12-13 LAB — PROTEIN ELECTROPHORESIS, SERUM
A/G RATIO SPE: 1.1 (ref 0.7–1.7)
ALBUMIN ELP: 4.3 g/dL (ref 2.9–4.4)
ALPHA 1: 0.3 g/dL (ref 0.0–0.4)
ALPHA 2: 0.8 g/dL (ref 0.4–1.0)
BETA: 1 g/dL (ref 0.7–1.3)
GAMMA GLOBULIN: 1.8 g/dL (ref 0.4–1.8)
Globulin, Total: 3.8 g/dL (ref 2.2–3.9)
Total Protein: 8.1 g/dL (ref 6.0–8.5)

## 2016-12-13 LAB — PATHOLOGIST SMEAR REVIEW
BASOS ABS: 0 10*3/uL (ref 0.0–0.2)
Basos: 1 %
EOS (ABSOLUTE): 0.1 10*3/uL (ref 0.0–0.4)
Eos: 1 %
HEMATOCRIT: 41.6 % (ref 34.0–46.6)
Hemoglobin: 13.6 g/dL (ref 11.1–15.9)
IMMATURE GRANS (ABS): 0 10*3/uL (ref 0.0–0.1)
Immature Granulocytes: 0 %
LYMPHS: 23 %
Lymphocytes Absolute: 0.9 10*3/uL (ref 0.7–3.1)
MCH: 34.6 pg — ABNORMAL HIGH (ref 26.6–33.0)
MCHC: 32.7 g/dL (ref 31.5–35.7)
MCV: 106 fL — AB (ref 79–97)
MONOCYTES: 10 %
MONOS ABS: 0.4 10*3/uL (ref 0.1–0.9)
NEUTROS ABS: 2.6 10*3/uL (ref 1.4–7.0)
Neutrophils: 65 %
Path Rev PLTs: NORMAL
Path Rev WBC: NORMAL
Platelets: 272 10*3/uL (ref 150–379)
RBC: 3.93 x10E6/uL (ref 3.77–5.28)
RDW: 15.1 % (ref 12.3–15.4)
WBC: 3.9 10*3/uL (ref 3.4–10.8)

## 2016-12-18 ENCOUNTER — Other Ambulatory Visit: Payer: Self-pay | Admitting: Family Medicine

## 2016-12-18 NOTE — Telephone Encounter (Signed)
Cr and K+ from Nov 2017 reviewed Rx approved

## 2016-12-18 NOTE — Assessment & Plan Note (Signed)
Doing well on current medicine without adverse effects; [place]CCS[place] website reviewed prior; three months of refills provided

## 2017-03-18 ENCOUNTER — Encounter: Payer: Managed Care, Other (non HMO) | Admitting: Family Medicine

## 2017-05-02 ENCOUNTER — Encounter: Payer: Self-pay | Admitting: Family Medicine

## 2017-05-02 ENCOUNTER — Ambulatory Visit (INDEPENDENT_AMBULATORY_CARE_PROVIDER_SITE_OTHER): Payer: Managed Care, Other (non HMO) | Admitting: Family Medicine

## 2017-05-02 VITALS — BP 134/72 | HR 80 | Temp 98.1°F | Resp 16 | Ht 65.0 in | Wt 151.0 lb

## 2017-05-02 DIAGNOSIS — D7589 Other specified diseases of blood and blood-forming organs: Secondary | ICD-10-CM

## 2017-05-02 DIAGNOSIS — G43009 Migraine without aura, not intractable, without status migrainosus: Secondary | ICD-10-CM

## 2017-05-02 DIAGNOSIS — F902 Attention-deficit hyperactivity disorder, combined type: Secondary | ICD-10-CM | POA: Diagnosis not present

## 2017-05-02 DIAGNOSIS — I10 Essential (primary) hypertension: Secondary | ICD-10-CM

## 2017-05-02 MED ORDER — LISDEXAMFETAMINE DIMESYLATE 60 MG PO CAPS: 60.0000 mg | ORAL_CAPSULE | Freq: Every day | ORAL | 0 refills | Status: DC

## 2017-05-02 MED ORDER — LISDEXAMFETAMINE DIMESYLATE 60 MG PO CAPS: 60.0000 mg | ORAL_CAPSULE | ORAL | 0 refills | Status: DC

## 2017-05-02 NOTE — Progress Notes (Addendum)
Name: Ashley RoanDebra M Marciel   MRN: [id number]    DOB: [birth year]   Date:05/02/2017       Progress Note  Subjective  Chief Complaint  Chief Complaint  Patient presents with  . Medication Refill    Vyvanse    HPI  No medical excitement since last visit, she is feeling well today and has no other concerns than what is discussed below:  ADHD; medicine is working well; ran out on May 8th, work has been very busy and understaffed and she has not been able to get in for a refill. Being off of the medication has impacted life at home significantly - she says her house "is a mess and I can't seem to finish anything". Work is really structured, so she is forced to stay on task. She would like refills today.  [place] Reviewed, Last Fill 02/09/2017, no red flags noted.  Migraines; not having them very often - has taken 1-3 Imitrex a month at most; have really decreased in frequency since turning 62; perfume and different smells are a trigger  HTN; well-controlled at goal today; does not check BP away from doctor; knows to avoid salt, just adds a little bit; knows to avoid decongestants  Macrocytosis: Labs done at 12/11/2016 and were negative. Patient was unable to do urine testing and declines further lab testing today. Discussed importance of lab testing at length and she declines, she will schedule CPE with PCP and is willing to have labs done then.   Patient Active Problem List   Diagnosis Date Noted  . Macrocytosis without anemia 09/10/2016  . Hand cramps 09/10/2016  . Medication monitoring encounter 09/10/2016  . Controlled substance agreement signed 04/10/2016  . Allergic rhinitis 09/19/2015  . Chronic airway obstruction (HCC) 09/19/2015  . Arthritis of knee, degenerative 09/19/2015  . ADHD (attention deficit hyperactivity disorder) 05/02/2015  . Special screening for malignant neoplasms, colon 02/02/2014  . CONSTIPATION 04/22/2008  . Migraine 04/21/2008  . Essential hypertension 04/21/2008  .  INTERNAL HEMORRHOIDS 04/21/2008  . CHRONIC OBSTRUCTIVE PULMONARY DISEASE 04/21/2008  . BARRETTS ESOPHAGUS 04/21/2008  . DEPRESSION, HX OF 04/21/2008  . ANEMIA, HX OF 04/21/2008  . PEPTIC ULCER DISEASE, HX OF 04/21/2008  . SMALL BOWEL OBSTRUCTION, HX OF 04/21/2008  . Benign essential HTN 01/26/2008  . Cannot sleep 01/26/2008  . Migraine with aura 01/26/2008    Social History  Substance Use Topics  . Smoking status: Former Smoker    Quit date: 11/05/1988  . Smokeless tobacco: Never Used  . Alcohol use Yes     Comment: 1 glass on weekends     Current Outpatient Prescriptions:  .  atenolol (TENORMIN) 100 MG tablet, TAKE 1 TABLET BY MOUTH EVERY 12 HOURS, Disp: 60 tablet, Rfl: 5 .  fluticasone (FLONASE) 50 MCG/ACT nasal spray, Place 2 sprays into both nostrils daily., Disp: 16 g, Rfl: 11 .  lisdexamfetamine (VYVANSE) 60 MG capsule, Take 1 capsule (60 mg total) by mouth daily., Disp: 30 capsule, Rfl: 0 .  lisdexamfetamine (VYVANSE) 60 MG capsule, Take 1 capsule (60 mg total) by mouth every morning., Disp: 30 capsule, Rfl: 0 .  lisdexamfetamine (VYVANSE) 60 MG capsule, Take 1 capsule (60 mg total) by mouth every morning., Disp: 30 capsule, Rfl: 0 .  loratadine (CLARITIN) 10 MG tablet, Take 10 mg by mouth daily., Disp: , Rfl:  .  losartan (COZAAR) 50 MG tablet, TAKE ONE TABLET BY MOUTH EVERY DAY, Disp: 30 tablet, Rfl: 9 .  SUMAtriptan (IMITREX) 100 MG tablet,  Take 1 tablet (100 mg total) by mouth once. May repeat in 2 hours if headache persists or recurs., Disp: 9 tablet, Rfl: 3 .  SUMAtriptan (IMITREX) 20 MG/ACT nasal spray, Place 1 spray (20 mg total) into the nose once. May repeat in 2 hours if headache persists or recurs., Disp: 5 Inhaler, Rfl: 3  Allergies  Allergen Reactions  . Topamax [Topiramate] Other (See Comments)    "shut my body down."    ROS  Constitutional: Negative for fever or weight change.  Respiratory: Negative for cough and shortness of breath.   Cardiovascular:  Negative for chest pain or palpitations.  Gastrointestinal: Negative for abdominal pain, no bowel changes.  Musculoskeletal: Negative for gait problem or joint swelling.  Skin: Negative for rash.  Neurological: Negative for dizziness or headache.  No other specific complaints in a complete review of systems (except as listed in HPI above).  Objective  Vitals:   05/02/17 1339  BP: 134/72  Pulse: 80  Resp: 16  Temp: 98.1 F (36.7 C)  TempSrc: Oral  SpO2: 93%  Weight: 151 lb (68.5 kg)  Height: 5\' 5"  (1.651 m)   Body mass index is 25.13 kg/m.  Nursing Note and Vital Signs reviewed.  Physical Exam  Constitutional: Patient appears well-developed and well-nourished. No distress.  HEENT: head atraumatic, normocephalic Cardiovascular: Normal rate, regular rhythm, S1/S2 present.  No murmur or rub heard. No BLE edema. Pulmonary/Chest: Effort normal and breath sounds clear. No respiratory distress or retractions. Abdominal: Soft and non-tender, bowel sounds present x4 quadrants. Psychiatric: Patient has a normal anxious mood and affect. behavior is normal. Judgment and thought content normal.  No results found for this or any previous visit (from the past 2160 hour(s)).  Assessment & Plan  1. Attention deficit hyperactivity disorder (ADHD), combined type - lisdexamfetamine (VYVANSE) 60 MG capsule; Take 1 capsule (60 mg total) by mouth daily.  Dispense: 30 capsule; Refill: 0 - lisdexamfetamine (VYVANSE) 60 MG capsule; Take 1 capsule (60 mg total) by mouth every morning.  Dispense: 30 capsule; Refill: 0 - lisdexamfetamine (VYVANSE) 60 MG capsule; Take 1 capsule (60 mg total) by mouth every morning.  Dispense: 30 capsule; Refill: 0  2. Migraine without aura and without status migrainosus, not intractable Stable  3. Essential hypertension Stable  4. Macrocytosis without anemia Will do labs at CPE with PCP (Dr. Sherie Don).   Return for As soon as possible for  Annual CPE.]  I have  reviewed this encounter including the documentation in this note and/or discussed this patient with the Deboraha Sprang, FNP, NP-C. I am certifying that I agree with the content of this note as supervising physician.  Alba Cory, MD [facility] 05/05/2017, 5:41 PM

## 2017-05-02 NOTE — Progress Notes (Signed)
134/72 

## 2017-06-03 ENCOUNTER — Encounter: Payer: Managed Care, Other (non HMO) | Admitting: Family Medicine

## 2017-06-08 ENCOUNTER — Other Ambulatory Visit: Payer: Self-pay | Admitting: Family Medicine

## 2017-08-06 ENCOUNTER — Encounter: Payer: Self-pay | Admitting: Family Medicine

## 2017-08-06 ENCOUNTER — Ambulatory Visit (INDEPENDENT_AMBULATORY_CARE_PROVIDER_SITE_OTHER): Payer: Managed Care, Other (non HMO) | Admitting: Family Medicine

## 2017-08-06 VITALS — BP 134/86 | HR 73 | Temp 98.0°F | Resp 14 | Wt 145.7 lb

## 2017-08-06 DIAGNOSIS — F901 Attention-deficit hyperactivity disorder, predominantly hyperactive type: Secondary | ICD-10-CM

## 2017-08-06 DIAGNOSIS — F902 Attention-deficit hyperactivity disorder, combined type: Secondary | ICD-10-CM

## 2017-08-06 DIAGNOSIS — Z1231 Encounter for screening mammogram for malignant neoplasm of breast: Secondary | ICD-10-CM

## 2017-08-06 DIAGNOSIS — Z1239 Encounter for other screening for malignant neoplasm of breast: Secondary | ICD-10-CM

## 2017-08-06 MED ORDER — LISDEXAMFETAMINE DIMESYLATE 60 MG PO CAPS: 60.0000 mg | ORAL_CAPSULE | ORAL | 0 refills | Status: DC

## 2017-08-06 MED ORDER — LISDEXAMFETAMINE DIMESYLATE 60 MG PO CAPS: 60.0000 mg | ORAL_CAPSULE | Freq: Every day | ORAL | 0 refills | Status: DC

## 2017-08-06 NOTE — Patient Instructions (Addendum)
Return in 3 months for ADHD Return soon for physical and pap smear

## 2017-08-06 NOTE — Progress Notes (Signed)
BP 134/86 (BP Location: Right Arm)   Pulse 73   Temp 98 F (36.7 C) (Oral)   Resp 14   Wt 145 lb 11.2 oz (66.1 kg)   BMI 24.25 kg/m    Subjective:    Patient ID: Ashley [name], female    DOB: 23-May-1951, 66 y.o.   MRN: [id number]  HPI: Ashley [name] is a 67 y.o. female  Chief Complaint  Patient presents with  . Medication Refill    HPI Patient is here for follow-up No medical excitement ADHD On stimulants for about five or six years They have helped her, she knows this much No chest pain, no loss of appetite ("I wish!"), no chronic headaches, no palpitations Knows to avoid excessive caffeine and decongestants (avoiding)  Depression screen [facility] 2/9 08/06/2017 12/11/2016 09/10/2016 09/19/2015 05/02/2015  Decreased Interest 0 0 0 0 0  Down, Depressed, Hopeless 0 0 0 0 0  PHQ - 2 Score 0 0 0 0 0    Relevant past medical, surgical, family and social history reviewed Past Medical History:  Diagnosis Date  . ADHD (attention deficit hyperactivity disorder) 05/02/2015  . Anemia   . Attention deficit disorder   . Autoimmune liver disease   . Barrett's esophagus   . COPD (chronic obstructive pulmonary disease) (HCC)   . Depression   . Gastritis   . GERD (gastroesophageal reflux disease)   . Hypertension   . Insomnia   . Internal hemorrhoids   . Liver failure (HCC)   . Migraine   . Ovarian failure   . Peptic ulcer disease   . Small bowel obstruction [facility])    Past Surgical History:  Procedure Laterality Date  . ABDOMINAL HYSTERECTOMY    . DILATION AND CURETTAGE OF UTERUS    . OOPHORECTOMY     Family History  Problem Relation Age of Onset  . Heart failure Father   . Atrial fibrillation Mother   . Cerebral palsy Brother   . Heart attack Maternal Grandfather   . CAD Paternal Grandmother   . Colon cancer Neg Hx    Social History   Social History  . Marital status: Divorced    Spouse name: N/A  . Number of children: N/A  . Years of education: N/A    Occupational History  . Not on file.   Social History Main Topics  . Smoking status: Former Smoker    Quit date: 11/05/1988  . Smokeless tobacco: Never Used  . Alcohol use Yes     Comment: 1 glass on weekends  . Drug use: No  . Sexual activity: No   Other Topics Concern  . Not on file   Social History Narrative  . No narrative on file    Interim medical history since last visit reviewed. Allergies and medications reviewed  Review of Systems Per HPI unless specifically indicated above     Objective:    BP 134/86 (BP Location: Right Arm)   Pulse 73   Temp 98 F (36.7 C) (Oral)   Resp 14   Wt 145 lb 11.2 oz (66.1 kg)   BMI 24.25 kg/m   Wt Readings from Last 3 Encounters:  08/06/17 145 lb 11.2 oz (66.1 kg)  05/02/17 151 lb (68.5 kg)  12/11/16 144 lb (65.3 kg)    Physical Exam  Constitutional: She appears well-developed and well-nourished.  HENT:  Mouth/Throat: Mucous membranes are normal.  Eyes: EOM are normal. No scleral icterus.  Cardiovascular: Normal rate and regular rhythm.  Pulmonary/Chest: Effort normal and breath sounds normal.  Neurological:  Reflex Scores:      Patellar reflexes are 2+ on the right side and 2+ on the left side. Fidgety and hyperactive off of her medicine today  Psychiatric: She has a normal mood and affect. Her mood appears not anxious. Her speech is not rapid and/or pressured. She is hyperactive. She is not agitated. She does not exhibit a depressed mood.      Assessment & Plan:   Problem List Items Addressed This Visit      Other   ADHD (attention deficit hyperactivity disorder) - Primary    Discussed cardiac risk, stroke risk; medicine provides benefit; refill for 3 months; return in 3 months; "OHIO" discussed      Relevant Medications   lisdexamfetamine (VYVANSE) 60 MG capsule   lisdexamfetamine (VYVANSE) 60 MG capsule   lisdexamfetamine (VYVANSE) 60 MG capsule    Other Visit Diagnoses    Screening for breast cancer        Relevant Orders   MM Digital Screening       Follow up plan: Return in about 3 months (around 11/06/2017) for follow-up visit with Dr. Sherie Don for ADHD; CPE in the next several weeks.  An after-visit summary was printed and given to the patient at check-out.  Please see the patient instructions which may contain other information and recommendations beyond what is mentioned above in the assessment and plan.  Meds ordered this encounter  Medications  . lisdexamfetamine (VYVANSE) 60 MG capsule    Sig: Take 1 capsule (60 mg total) by mouth daily.    Dispense:  30 capsule    Refill:  0    Fill now #1 - Fill today  . lisdexamfetamine (VYVANSE) 60 MG capsule    Sig: Take 1 capsule (60 mg total) by mouth every morning.    Dispense:  30 capsule    Refill:  0    Fill on or after Oct 05, 2017  . lisdexamfetamine (VYVANSE) 60 MG capsule    Sig: Take 1 capsule (60 mg total) by mouth every morning.    Dispense:  30 capsule    Refill:  0    Fill on or after Sep 05, 2017    Orders Placed This Encounter  Procedures  . MM Digital Screening

## 2017-08-06 NOTE — Assessment & Plan Note (Signed)
Discussed cardiac risk, stroke risk; medicine provides benefit; refill for 3 months; return in 3 months; "OHIO" discussed

## 2017-09-20 ENCOUNTER — Encounter: Payer: Managed Care, Other (non HMO) | Admitting: Family Medicine

## 2017-09-30 ENCOUNTER — Other Ambulatory Visit: Payer: Self-pay | Admitting: Family Medicine

## 2017-10-04 ENCOUNTER — Other Ambulatory Visit: Payer: Self-pay | Admitting: Family Medicine

## 2017-10-04 DIAGNOSIS — D7589 Other specified diseases of blood and blood-forming organs: Secondary | ICD-10-CM

## 2017-10-04 DIAGNOSIS — I10 Essential (primary) hypertension: Secondary | ICD-10-CM

## 2017-10-04 DIAGNOSIS — Z5181 Encounter for therapeutic drug level monitoring: Secondary | ICD-10-CM

## 2017-10-06 NOTE — Telephone Encounter (Signed)
I don't see that patient ever brought back her urine tests that were ordered earlier this year Her work-up for the elevated MCV is not complete I'd like to have her come in for additional blood work in the next week I'm going to refer her to a doctor who specializes in abnormal red blood cells, a hematologist and let them order the urine tests Thank you

## 2017-10-08 NOTE — Telephone Encounter (Signed)
Called pt, no answer. Unable to leave message as voicemail is full.

## 2017-10-09 NOTE — Telephone Encounter (Signed)
Called pt no answer. Unable to leave message as voicemail is full. Sent [employer] message.

## 2017-11-06 ENCOUNTER — Ambulatory Visit: Payer: Managed Care, Other (non HMO) | Admitting: Family Medicine

## 2017-11-14 ENCOUNTER — Encounter: Payer: Self-pay | Admitting: Family Medicine

## 2017-11-14 ENCOUNTER — Ambulatory Visit (INDEPENDENT_AMBULATORY_CARE_PROVIDER_SITE_OTHER): Payer: 59 | Admitting: Family Medicine

## 2017-11-14 DIAGNOSIS — I10 Essential (primary) hypertension: Secondary | ICD-10-CM | POA: Diagnosis not present

## 2017-11-14 DIAGNOSIS — Z5181 Encounter for therapeutic drug level monitoring: Secondary | ICD-10-CM | POA: Diagnosis not present

## 2017-11-14 DIAGNOSIS — D7589 Other specified diseases of blood and blood-forming organs: Secondary | ICD-10-CM | POA: Diagnosis not present

## 2017-11-14 DIAGNOSIS — Z79899 Other long term (current) drug therapy: Secondary | ICD-10-CM | POA: Diagnosis not present

## 2017-11-14 DIAGNOSIS — G43101 Migraine with aura, not intractable, with status migrainosus: Secondary | ICD-10-CM | POA: Diagnosis not present

## 2017-11-14 DIAGNOSIS — F902 Attention-deficit hyperactivity disorder, combined type: Secondary | ICD-10-CM

## 2017-11-14 LAB — CBC WITH DIFFERENTIAL/PLATELET
BASOS ABS: 42 {cells}/uL (ref 0–200)
Basophils Relative: 0.8 %
Eosinophils Absolute: 203 cells/uL (ref 15–500)
Eosinophils Relative: 3.9 %
HCT: 35.6 % (ref 35.0–45.0)
Hemoglobin: 12.1 g/dL (ref 11.7–15.5)
LYMPHS ABS: 1024 {cells}/uL (ref 850–3900)
MCH: 34.4 pg — AB (ref 27.0–33.0)
MCHC: 34 g/dL (ref 32.0–36.0)
MCV: 101.1 fL — ABNORMAL HIGH (ref 80.0–100.0)
MPV: 10.1 fL (ref 7.5–12.5)
Monocytes Relative: 10.4 %
NEUTROS PCT: 65.2 %
Neutro Abs: 3390 cells/uL (ref 1500–7800)
PLATELETS: 280 10*3/uL (ref 140–400)
RBC: 3.52 10*6/uL — AB (ref 3.80–5.10)
RDW: 13 % (ref 11.0–15.0)
TOTAL LYMPHOCYTE: 19.7 %
WBC: 5.2 10*3/uL (ref 3.8–10.8)
WBCMIX: 541 {cells}/uL (ref 200–950)

## 2017-11-14 LAB — COMPLETE METABOLIC PANEL WITH GFR
AG Ratio: 1.2 (calc) (ref 1.0–2.5)
ALBUMIN MSPROF: 4.2 g/dL (ref 3.6–5.1)
ALKALINE PHOSPHATASE (APISO): 75 U/L (ref 33–130)
ALT: 38 U/L — ABNORMAL HIGH (ref 6–29)
AST: 37 U/L — ABNORMAL HIGH (ref 10–35)
BILIRUBIN TOTAL: 0.4 mg/dL (ref 0.2–1.2)
BUN / CREAT RATIO: 33 (calc) — AB (ref 6–22)
BUN: 27 mg/dL — ABNORMAL HIGH (ref 7–25)
CO2: 30 mmol/L (ref 20–32)
Calcium: 9.2 mg/dL (ref 8.6–10.4)
Chloride: 105 mmol/L (ref 98–110)
Creat: 0.82 mg/dL (ref 0.50–0.99)
GFR, EST AFRICAN AMERICAN: 88 mL/min/{1.73_m2} (ref >=60)
GFR, Est Non African American: 76 mL/min/{1.73_m2} (ref >=60)
GLOBULIN: 3.6 g/dL (ref 1.9–3.7)
Glucose, Bld: 94 mg/dL (ref 65–99)
Potassium: 4.7 mmol/L (ref 3.5–5.3)
SODIUM: 141 mmol/L (ref 135–146)
TOTAL PROTEIN: 7.8 g/dL (ref 6.1–8.1)

## 2017-11-14 LAB — LIPID PANEL
CHOLESTEROL: 187 mg/dL (ref <200)
HDL: 127 mg/dL (ref >50)
LDL CHOLESTEROL (CALC): 45 mg/dL
Non-HDL Cholesterol (Calc): 60 mg/dL (calc) (ref <130)
TRIGLYCERIDES: 72 mg/dL (ref <150)
Total CHOL/HDL Ratio: 1.5 (calc) (ref <5.0)

## 2017-11-14 LAB — FOLATE: Folate: 15.1 ng/mL

## 2017-11-14 LAB — TSH: TSH: 1.8 m[IU]/L (ref 0.40–4.50)

## 2017-11-14 LAB — VITAMIN B12: VITAMIN B 12: 314 pg/mL (ref 200–1100)

## 2017-11-14 MED ORDER — SUMATRIPTAN 20 MG/ACT NA SOLN: 20.0000 mg | Freq: Once | NASAL | 3 refills | Status: DC

## 2017-11-14 MED ORDER — LISDEXAMFETAMINE DIMESYLATE 60 MG PO CAPS: 60.0000 mg | ORAL_CAPSULE | ORAL | 0 refills | Status: DC

## 2017-11-14 MED ORDER — SUMATRIPTAN SUCCINATE 100 MG PO TABS: 100.0000 mg | ORAL_TABLET | Freq: Once | ORAL | 3 refills | Status: DC

## 2017-11-14 MED ORDER — LISDEXAMFETAMINE DIMESYLATE 60 MG PO CAPS: 60.0000 mg | ORAL_CAPSULE | Freq: Every day | ORAL | 0 refills | Status: DC

## 2017-11-14 NOTE — Assessment & Plan Note (Signed)
Updated agreement on the chart

## 2017-11-14 NOTE — Assessment & Plan Note (Signed)
Check labs today; discussed possible reasons for large dense RBCs

## 2017-11-14 NOTE — Assessment & Plan Note (Signed)
Ongoing for years; well-controlled; no evidence of LVH on last EKG; continue medicine

## 2017-11-14 NOTE — Patient Instructions (Addendum)
Try vitamin B12 sublingual to help with energy and weight loss Try magnesium oxide 250 mg or 400 mg daily to help lessen migraines   Living With Attention Deficit Hyperactivity Disorder If you have been diagnosed with attention deficit hyperactivity disorder (ADHD), you may be relieved that you now know why you have felt or behaved a certain way. Still, you may feel overwhelmed about the treatment ahead. You may also wonder how to get the support you need and how to deal with the condition day-to-day. With treatment and support, you can live with ADHD and manage your symptoms. How to manage lifestyle changes Managing stress Stress is your body's reaction to life changes and events, both good and bad. To cope with the stress of an ADHD diagnosis, it may help to:  Learn more about ADHD.  Exercise regularly. Even a short daily walk can lower stress levels.  Participate in training or education programs (including social skills training classes) that teach you to deal with symptoms.  Medicines Your health care provider may suggest certain medicines if he or she feels that they will help to improve your condition. Stimulant medicines are usually prescribed to treat ADHD, and therapy may also be prescribed. It is important to:  Avoid using alcohol and other substances that may prevent your medicines from working properly [facility](mayinteract).  Talk with your pharmacist or health care provider about all the medicines that you take, their possible side effects, and what medicines are safe to take together.  Make it your goal to take part in all treatment decisions (shared decision-making). Ask about possible side effects of medicines that your health care provider recommends, and tell him or her how you feel about having those side effects. It is best if shared decision-making with your health care provider is part of your total treatment plan.  Relationships To strengthen your relationships with family  members while treating your condition, consider taking part in family therapy. You might also attend self-help groups alone or with a loved one. Be honest about how your symptoms affect your relationships. Make an effort to communicate respectfully instead of fighting, and find ways to show others that you care. Psychotherapy may be useful in helping you cope with how ADHD affects your relationships. How to recognize changes in your condition The following signs may mean that your treatment is working well and your condition is improving:  Consistently being on time for appointments.  Being more organized at home and work.  Other people noticing improvements in your behavior.  Achieving goals that you set for yourself.  Thinking more clearly.  The following signs may mean that your treatment is not working very well:  Feeling impatience or more confusion.  Missing, forgetting, or being late for appointments.  An increasing sense of disorganization and messiness.  More difficulty in reaching goals that you set for yourself.  Loved ones becoming angry or frustrated with you.  Where to find support Talking to others  Keep emotion out of important discussions and speak in a calm, logical way.  Listen closely and patiently to your loved ones. Try to understand their point of view, and try to avoid getting defensive.  Take responsibility for the consequences of your actions.  Ask that others do not take your behaviors personally.  Aim to solve problems as they come up, and express your feelings instead of bottling them up.  Talk openly about what you need from your loved ones and how they can support you.  Consider going to family therapy sessions or having your family meet with a specialist who deals with ADHD-related behavior problems. Finances Not all insurance plans cover mental health care, so it is important to check with your insurance carrier. If paying for co-pays or  counseling services is a problem, search for a local or county mental health care center. Public mental health care services may be offered there at a low cost or no cost when you are not able to see a private health care provider. If you are taking medicine for ADHD, you may be able to get the generic form, which may be less expensive than brand-name medicine. Some makers of prescription medicines also offer help to patients who cannot afford the medicines that they need. Follow these instructions at home:  Take over-the-counter and prescription medicines only as told by your health care provider. Check with your health care provider before taking any new medicines.  Create structure and an organized atmosphere at home. For example: ? Make a list of tasks, then rank them from most important to least important. Work on one task at a time until your listed tasks are done. ? Make a daily schedule and follow it consistently every day. ? Use an appointment calendar, and check it 2 or 3 times a day to keep on track. Keep it with you when you leave the house. ? Create spaces where you keep certain things, and always put things back in their places after you use them.  Keep all follow-up visits as told by your health care provider. This is important. Questions to ask your health care provider:  What are the risks and benefits of taking medicines?  Would I benefit from therapy?  How often should I follow up with a health care provider? Contact a health care provider if:  You have side effects from your medicines, such as: ? Repeated muscle twitches, coughing, or speech outbursts. ? Sleep problems. ? Loss of appetite. ? Depression. ? New or worsening behavior problems. ? Dizziness. ? Unusually fast heartbeat. ? Stomach pains. ? Headaches. Get help right away if:  You have a severe reaction to a medicine.  Your behavior suddenly gets worse. Summary  With treatment and support, you can  live with ADHD and manage your symptoms.  The medicines that are most often prescribed for ADHD are stimulants.  Consider taking part in family therapy or self-help groups with family members or friends.  When you talk with friends and family about your ADHD, be patient and communicate openly.  Take over-the-counter and prescription medicines only as told by your health care provider. Check with your health care provider before taking any new medicines. This information is not intended to replace advice given to you by your health care provider. Make sure you discuss any questions you have with your health care provider. Document Released: 02/21/2017 Document Revised: 02/21/2017 Document Reviewed: 02/21/2017 Elsevier Interactive Patient Education  [employer].

## 2017-11-14 NOTE — Assessment & Plan Note (Signed)
Patient was made aware today that stimulants for ADHD can increase risk of heart attack and stroke; balance that with effectiveness of medicine, improvement in quality of life; she wishes to continue the medicine; refills given; NCCSRS web site reviewed; no red flags; UDS and agreement today; return in 3 months; glad she finds OHIO helpful

## 2017-11-14 NOTE — Assessment & Plan Note (Signed)
Try supplemental magnesium to help with prevention; refilled medicines at her request

## 2017-11-14 NOTE — Progress Notes (Signed)
BP 136/84 (BP Location: Right Arm, Patient Position: Sitting, Cuff Size: Large)   Pulse 69   Temp (!) 97.3 F (36.3 C) (Oral)   Ht 5\' 5"  (1.651 m)   Wt 150 lb 12.8 oz (68.4 kg)   SpO2 93%   BMI 25.09 kg/m    Subjective:    Patient ID: Ashley [name], female    DOB: [birth year], 67 y.o.   MRN: [id number]  HPI: MALEAHA HUGHETT is a 67 y.o. female  Chief Complaint  Patient presents with  . Follow-up    HPI Migraine; needs refills of the imitrex; uses either pill or spray based on severity; just 3 a month  Gained weight over the holidays; she wants to get that off; down to 143 pounds; but she ate and ate over the holidays; she'll drink enough water  ADHD; using the mantra only handle it once; that is helping; on the medicine; no palpitations and no chest pain; good appetite; EKG done 2016, reviewed  Hx of macrocytosis; she drinks one glass of wine if at all; not overdoing it; wonders if low on B12; energy level varies  Depression screen [facility] 2/9 11/14/2017 08/06/2017 12/11/2016 09/10/2016 09/19/2015  Decreased Interest 0 0 0 0 0  Down, Depressed, Hopeless 0 0 0 0 0  PHQ - 2 Score 0 0 0 0 0    Relevant past medical, surgical, family and social history reviewed Past Medical History:  Diagnosis Date  . ADHD (attention deficit hyperactivity disorder) 05/02/2015  . Anemia   . Attention deficit disorder   . Autoimmune liver disease   . Barrett's esophagus   . COPD (chronic obstructive pulmonary disease) (HCC)   . Depression   . Gastritis   . GERD (gastroesophageal reflux disease)   . Hypertension   . Insomnia   . Internal hemorrhoids   . Liver failure (HCC)   . Migraine   . Ovarian failure   . Peptic ulcer disease   . Small bowel obstruction [facility])    Past Surgical History:  Procedure Laterality Date  . ABDOMINAL HYSTERECTOMY    . DILATION AND CURETTAGE OF UTERUS    . OOPHORECTOMY     Family History  Problem Relation Age of Onset  . Heart failure Father   . Atrial  fibrillation Mother   . Cerebral palsy Brother   . Heart attack Maternal Grandfather   . CAD Paternal Grandmother   . Colon cancer Neg Hx    Social History   Tobacco Use  . Smoking status: Former Smoker    Last attempt to quit: 11/05/1988    Years since quitting: 29.0  . Smokeless tobacco: Never Used  Substance Use Topics  . Alcohol use: Yes    Comment: 1 glass on weekends  . Drug use: No  MD note: drinks just one glass of wine, not overdoing it she says  Interim medical history since last visit reviewed. Allergies and medications reviewed  Review of Systems Per HPI unless specifically indicated above     Objective:    BP 136/84 (BP Location: Right Arm, Patient Position: Sitting, Cuff Size: Large)   Pulse 69   Temp (!) 97.3 F (36.3 C) (Oral)   Ht 5\' 5"  (1.651 m)   Wt 150 lb 12.8 oz (68.4 kg)   SpO2 93%   BMI 25.09 kg/m   Wt Readings from Last 3 Encounters:  11/14/17 150 lb 12.8 oz (68.4 kg)  08/06/17 145 lb 11.2 oz (66.1 kg)  05/02/17  151 lb (68.5 kg)    Physical Exam  Constitutional: She appears well-developed and well-nourished.  Eyes: EOM are normal.  Cardiovascular: Normal rate and regular rhythm.  No extrasystoles are present.  Pulmonary/Chest: Effort normal and breath sounds normal.  Neurological: She displays no tremor.  Reflex Scores:      Patellar reflexes are 2+ on the right side and 2+ on the left side. Psychiatric: She has a normal mood and affect. Her behavior is normal. Judgment and thought content normal. Her mood appears not anxious. Her speech is not rapid and/or pressured. Cognition and memory are normal.    Results for orders placed or performed in visit on 12/11/16  Protein Electrophoresis, (serum)  Result Value Ref Range   Total Protein 8.1 6.0 - 8.5 g/dL   Albumin ELP 4.3 2.9 - 4.4 g/dL   Alpha 1 0.3 0.0 - 0.4 g/dL   Alpha 2 0.8 0.4 - 1.0 g/dL   Beta 1.0 0.7 - 1.3 g/dL   Gamma Globulin 1.8 0.4 - 1.8 g/dL   M-Spike, % Not Observed Not  Observed g/dL   GLOBULIN, TOTAL 3.8 2.2 - 3.9 g/dL   A/G Ratio 1.1 0.7 - 1.7   Please Note: Comment    Interpretation: Comment   Pathologist smear review  Result Value Ref Range   Path Rev WBC Normal    Path Rev RBC Comment    Path Rev PLTs Normal    PATHOLOGIST NAME Comment    WBC 3.9 3.4 - 10.8 x10E3/uL   RBC 3.93 3.77 - 5.28 x10E6/uL   Hemoglobin 13.6 11.1 - 15.9 g/dL   Hematocrit 04.541.6 40.934.0 - 46.6 %   MCV 106 (H) 79 - 97 fL   MCH 34.6 (H) 26.6 - 33.0 pg   MCHC 32.7 31.5 - 35.7 g/dL   RDW 81.115.1 91.412.3 - 78.215.4 %   Platelets 272 150 - 379 x10E3/uL   Neutrophils 65 Not Estab. %   Lymphs 23 Not Estab. %   Monocytes 10 Not Estab. %   Eos 1 Not Estab. %   Basos 1 Not Estab. %   Neutrophils Absolute 2.6 1.4 - 7.0 x10E3/uL   Lymphocytes Absolute 0.9 0.7 - 3.1 x10E3/uL   Monocytes Absolute 0.4 0.1 - 0.9 x10E3/uL   EOS (ABSOLUTE) 0.1 0.0 - 0.4 x10E3/uL   Basophils Absolute 0.0 0.0 - 0.2 x10E3/uL   Immature Granulocytes 0 Not Estab. %   Immature Grans (Abs) 0.0 0.0 - 0.1 x10E3/uL      Assessment & Plan:   Problem List Items Addressed This Visit      Other   ADHD (attention deficit hyperactivity disorder)       Follow up plan: No Follow-up on file.  An after-visit summary was printed and given to the patient at check-out.  Please see the patient instructions which may contain other information and recommendations beyond what is mentioned above in the assessment and plan.  No orders of the defined types were placed in this encounter.   No orders of the defined types were placed in this encounter.

## 2017-11-15 ENCOUNTER — Telehealth: Payer: Self-pay

## 2017-11-15 ENCOUNTER — Other Ambulatory Visit: Payer: Self-pay | Admitting: Family Medicine

## 2017-11-15 NOTE — Telephone Encounter (Signed)
Called pt, no answer. LM for pt informing her of lab results. CRM created. [facility]

## 2017-11-15 NOTE — Telephone Encounter (Signed)
-----   Message from Kerman PasseyMelinda P Lada, MD sent at 11/15/2017  4:48 PM EST ----- Please let pt know that her folic acid is fine; her vitamin B12 is low; I'll recommend vitamin B12 500 or 1000 mcg daily; that may help the red blood cells; we will recommend that she decrease her alcohol intake by 50% and then let's recheck her labs at her next visit with me in 3 months; her liver enzymes are up a little bit, so limit the alcohol and avoid tylenol; we'll recheck those at her visit too; her cholesterol is fabulous

## 2017-11-17 LAB — DRUG SCREEN URINE W/ALC, NO CONF
ALCOHOL, ETHYL (U): NEGATIVE
AMPHETAMINES (1000 ng/mL SCRN): NEGATIVE
BARBITURATES: NEGATIVE
BENZODIAZEPINES: NEGATIVE
COCAINE METABOLITES: NEGATIVE
MARIJUANA MET (50 NG/ML SCRN): NEGATIVE
METHADONE: NEGATIVE
METHAQUALONE: NEGATIVE
OPIATES: NEGATIVE
PHENCYCLIDINE: NEGATIVE
PROPOXYPHENE: NEGATIVE

## 2018-01-07 ENCOUNTER — Encounter: Payer: 59 | Admitting: Family Medicine

## 2018-02-11 ENCOUNTER — Other Ambulatory Visit: Payer: Self-pay | Admitting: Family Medicine

## 2018-02-11 NOTE — Telephone Encounter (Signed)
rx approved; last Cr and K+ approved

## 2018-04-14 ENCOUNTER — Other Ambulatory Visit: Payer: Self-pay | Admitting: Family Medicine

## 2018-04-16 ENCOUNTER — Ambulatory Visit: Payer: Self-pay | Admitting: Orthopedic Surgery

## 2018-04-30 ENCOUNTER — Inpatient Hospital Stay: Admission: RE | Admit: 2018-04-30 | Payer: Self-pay | Source: Ambulatory Visit

## 2018-05-07 ENCOUNTER — Other Ambulatory Visit: Payer: Self-pay

## 2018-05-07 ENCOUNTER — Encounter
Admission: RE | Admit: 2018-05-07 | Discharge: 2018-05-07 | Disposition: A | Discharge status: Home / Self Care | Payer: 59 | Source: Ambulatory Visit | Attending: Orthopedic Surgery | Admitting: Orthopedic Surgery

## 2018-05-07 ENCOUNTER — Ambulatory Visit: Payer: Self-pay | Admitting: Orthopedic Surgery

## 2018-05-07 ENCOUNTER — Inpatient Hospital Stay: Admission: RE | Admit: 2018-05-07 | Payer: Self-pay | Source: Ambulatory Visit

## 2018-05-07 DIAGNOSIS — Z01812 Encounter for preprocedural laboratory examination: Secondary | ICD-10-CM | POA: Diagnosis present

## 2018-05-07 DIAGNOSIS — I10 Essential (primary) hypertension: Secondary | ICD-10-CM | POA: Diagnosis not present

## 2018-05-07 DIAGNOSIS — Z01810 Encounter for preprocedural cardiovascular examination: Secondary | ICD-10-CM | POA: Diagnosis present

## 2018-05-07 HISTORY — DX: Anxiety disorder, unspecified: F41.9

## 2018-05-07 HISTORY — DX: Unspecified osteoarthritis, unspecified site: M19.90

## 2018-05-07 LAB — PROTIME-INR
INR: 0.9
PROTHROMBIN TIME: 12.1 s (ref 11.4–15.2)

## 2018-05-07 LAB — BASIC METABOLIC PANEL
ANION GAP: 11 (ref 5–15)
BUN: 40 mg/dL — ABNORMAL HIGH (ref 8–23)
CHLORIDE: 103 mmol/L (ref 98–111)
CO2: 24 mmol/L (ref 22–32)
Calcium: 9.6 mg/dL (ref 8.9–10.3)
Creatinine, Ser: 1.07 mg/dL — ABNORMAL HIGH (ref 0.44–1.00)
GFR calc non Af Amer: 54 mL/min — ABNORMAL LOW (ref >60)
GLUCOSE: 101 mg/dL — AB (ref 70–99)
POTASSIUM: 4.5 mmol/L (ref 3.5–5.1)
Sodium: 138 mmol/L (ref 135–145)

## 2018-05-07 LAB — SURGICAL PCR SCREEN
MRSA, PCR: NEGATIVE
STAPHYLOCOCCUS AUREUS: POSITIVE — AB

## 2018-05-07 LAB — URINALYSIS, ROUTINE W REFLEX MICROSCOPIC
Bilirubin Urine: NEGATIVE
Glucose, UA: NEGATIVE mg/dL
Hgb urine dipstick: NEGATIVE
Ketones, ur: NEGATIVE mg/dL
NITRITE: POSITIVE — AB
Protein, ur: NEGATIVE mg/dL
Specific Gravity, Urine: 1.02 (ref 1.005–1.030)
WBC, UA: >50 WBC/hpf — ABNORMAL HIGH (ref 0–5)
pH: 5 (ref 5.0–8.0)

## 2018-05-07 LAB — CBC
HEMATOCRIT: 38 % (ref 35.0–47.0)
Hemoglobin: 13 g/dL (ref 12.0–16.0)
MCH: 37 pg — AB (ref 26.0–34.0)
MCHC: 34.2 g/dL (ref 32.0–36.0)
MCV: 108 fL — ABNORMAL HIGH (ref 80.0–100.0)
PLATELETS: 216 10*3/uL (ref 150–440)
RBC: 3.52 MIL/uL — AB (ref 3.80–5.20)
RDW: 14.3 % (ref 11.5–14.5)
WBC: 4 10*3/uL (ref 3.6–11.0)

## 2018-05-07 LAB — TYPE AND SCREEN
ABO/RH(D): O POS
ANTIBODY SCREEN: NEGATIVE

## 2018-05-07 LAB — APTT: APTT: 27 s (ref 24–36)

## 2018-05-07 NOTE — Patient Instructions (Signed)
Your procedure is scheduled on: 05/14/18 Wed Report to Same Day Surgery 2nd floor medical mall [facility](Medical Mall Entrance-take elevator on left to 2nd floor.  Check in with surgery information desk.) To find out your arrival time please call [phone] between 1PM - 3PM on 05/13/18 Tues  Remember: Instructions that are not followed completely may result in serious medical risk, up to and including death, or upon the discretion of your surgeon and anesthesiologist your surgery may need to be rescheduled.    _x___ 1. Do not eat food after midnight the night before your procedure. You may drink clear liquids up to 2 hours before you are scheduled to arrive at the hospital for your procedure.  Do not drink clear liquids within 2 hours of your scheduled arrival to the hospital.  Clear liquids include  --Water or Apple juice without pulp  --Clear carbohydrate beverage such as ClearFast or Gatorade  --Black Coffee or Clear Tea (No milk, no creamers, do not add anything to                  the coffee or Tea Type 1 and type 2 diabetics should only drink water.  No gum chewing or hard candies.     __x__ 2. No Alcohol for 24 hours before or after surgery.   __x__3. No Smoking or e-cigarettes for 24 prior to surgery.  Do not use any chewable tobacco products for at least 6 hour prior to surgery   ____  4. Bring all medications with you on the day of surgery if instructed.    __x__ 5. Notify your doctor if there is any change in your medical condition     (cold, fever, infections).    x___6. On the morning of surgery brush your teeth with toothpaste and water.  You may rinse your mouth with mouth wash if you wish.  Do not swallow any toothpaste or mouthwash.   Do not wear jewelry, make-up, hairpins, clips or nail polish.  Do not wear lotions, powders, or perfumes. You may wear deodorant.  Do not shave 48 hours prior to surgery. Men may shave face and neck.  Do not bring valuables to the hospital.     [facility] any belongings or valuables.               Contacts, dentures or bridgework may not be worn into surgery.  Leave your suitcase in the car. After surgery it may be brought to your room.  For patients admitted to the hospital, discharge time is determined by your                       treatment team.  _  Patients discharged the day of surgery will not be allowed to drive home.  You will need someone to drive you home and stay with you the night of your procedure.    Please read over the following fact sheets that you were given:   [facility] Pre[facility] and or MRSA Information   _x___ Take anti-hypertensive listed below, cardiac, seizure, asthma,     anti-reflux and psychiatric medicines. These include:  1. atenolol (TENORMIN) 100 MG tablet  2.  3.  4.  5.  6.  ____Fleets enema or Magnesium Citrate as directed.   _x___ Use CHG Soap or sage wipes as directed on instruction sheet   ____ Use inhalers on the day of surgery and bring to hospital  day of surgery  ____ Stop Metformin and Janumet 2 days prior to surgery.    ____ Take 1/2 of usual insulin dose the night before surgery and none on the morning     surgery.   _x___ Follow recommendations from Cardiologist, Pulmonologist or PCP regarding          stopping Aspirin, Coumadin, Plavix ,Eliquis, Effient, or Pradaxa, and Pletal.  X____Stop Anti-inflammatories such as Advil, Aleve, Ibuprofen, Motrin, Naproxen, Naprosyn, Goodies powders or aspirin products. OK to take Tylenol and                          Celebrex.   _x___ Stop supplements until after surgery.  But may continue Vitamin D, Vitamin B,       and multivitamin.   ____ Bring C-Pap to the hospital.

## 2018-05-09 ENCOUNTER — Ambulatory Visit (INDEPENDENT_AMBULATORY_CARE_PROVIDER_SITE_OTHER): Payer: 59 | Admitting: Nurse Practitioner

## 2018-05-09 ENCOUNTER — Encounter: Payer: Self-pay | Admitting: Nurse Practitioner

## 2018-05-09 ENCOUNTER — Telehealth: Payer: Self-pay | Admitting: Family Medicine

## 2018-05-09 ENCOUNTER — Telehealth: Payer: Self-pay

## 2018-05-09 VITALS — BP 180/110 | HR 61 | Temp 97.8°F | Resp 16 | Ht 65.0 in | Wt 143.9 lb

## 2018-05-09 DIAGNOSIS — I10 Essential (primary) hypertension: Secondary | ICD-10-CM

## 2018-05-09 DIAGNOSIS — J42 Unspecified chronic bronchitis: Secondary | ICD-10-CM

## 2018-05-09 DIAGNOSIS — D7589 Other specified diseases of blood and blood-forming organs: Secondary | ICD-10-CM

## 2018-05-09 DIAGNOSIS — F901 Attention-deficit hyperactivity disorder, predominantly hyperactive type: Secondary | ICD-10-CM

## 2018-05-09 DIAGNOSIS — N3090 Cystitis, unspecified without hematuria: Secondary | ICD-10-CM | POA: Diagnosis not present

## 2018-05-09 MED ORDER — SULFAMETHOXAZOLE-TRIMETHOPRIM 800-160 MG PO TABS: 1.0000 | ORAL_TABLET | Freq: Two times a day (BID) | ORAL | 0 refills | Status: DC

## 2018-05-09 MED ORDER — AMLODIPINE BESYLATE 10 MG PO TABS: 10.0000 mg | ORAL_TABLET | Freq: Every day | ORAL | 3 refills | Status: DC

## 2018-05-09 NOTE — Telephone Encounter (Signed)
Copied from CRM [phone]#[id number]. Topic: Quick Communication - See Telephone Encounter >> May 09, 2018  4:26 PM Lorrine KinMcGee, Tyon Cerasoli B, NT wrote: CRM for notification. See Telephone encounter for: 05/09/18. Patient wanted to let Sharyon CableElizabeth Poulose know that she just had her blood pressure checked at [facility] and it was 138/85. States that it was elevated at her appointment this morning and she just wanted to let the provider know that it is coming down.

## 2018-05-09 NOTE — Telephone Encounter (Signed)
Copied from CRM [phone]. Topic: General - Other >> May 09, 2018  1:34 PM Marylen PontoMcneil, Ja-Kwan wrote: Reason for CRM: Cordelia PenSherry with [facility] states pt is having surgery on 05/14/18 and she is requesting surgical clearance. Cb# [phone] Ext. [phone]  Fax# [phone]  Patient's BP was too high today during office visit with Lanora Manislizabeth so she has been scheduled to see Dr. Sherie DonLada on Monday to get clearance.

## 2018-05-09 NOTE — Progress Notes (Addendum)
Name: Ashley [name]   MRN: [id number]    DOB: [birth year]   Date:05/09/2018       Progress Note  Subjective  Chief Complaint  Chief Complaint  Patient presents with  . Form Completion    Surgical Clearance form needs to be completed    HPI  Patient presents for surgical clearance for total knee replacement of right knee. Has had arthritis for years and fell on it in may, has been pushing buggy to get around; here today in wheel chair.   Patient has no known history of moderate or greater valvular stenosis or regurgitation, cardiac implantable electronic decide, pulmonary hypertension, congenital heart diease or severe systemic disease.   Activity- limited due to knee stiffness but able to:  - walk at home; less fear of falling there.  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  - throw a baseball or football with kids    High-risk surgery - No  History of ischemic heart disease (History of myocardial infarction (MI); history of positive exercise test; current chest pain considered due to myocardial ischemia; use of nitrate therapy or ECG with pathological Q waves)- No  History of congestive heart failure (Pulmonary edema, bilateral rales or S3 gallop; paroxysmal nocturnal dyspnea; chest x-ray (CXR) showing pulmonary vascular redistribution)- N0  History of cerebrovascular disease (Prior transient ischemic attack (TIA) or stroke)- No Pre-operative treatment with insulin- No  Pre-operative creatinine >2 mg/dL / 176.8 mol/L - No  Hypertension: increased fear of surgery; states taking bp meds as rx, not a lot of salt in diet.   BP Readings from Last 3 Encounters:  05/09/18 (!) 180/110  05/07/18 (!) 160/90  11/14/17 136/84    ADHD: Not taking vyvanse right now- last time was on it was 2 months ago.   COPD: stable, denies chronic cough, and shortness of breath never required an inhaler, feels breathing does not limit activity. Former smoker- quit in 1990  Anemia:  stable, takes vitamin B12 H/H 13/38   Patient Active Problem List   Diagnosis Date Noted  . Macrocytosis without anemia 09/10/2016  . Hand cramps 09/10/2016  . Medication monitoring encounter 09/10/2016  . Controlled substance agreement signed 04/10/2016  . Allergic rhinitis 09/19/2015  . Arthritis of knee, degenerative 09/19/2015  . ADHD (attention deficit hyperactivity disorder) 05/02/2015  . Special screening for malignant neoplasms, colon 02/02/2014  . CONSTIPATION 04/22/2008  . INTERNAL HEMORRHOIDS 04/21/2008  . COPD (chronic obstructive pulmonary disease) ([place]) 04/21/2008  . BARRETTS ESOPHAGUS 04/21/2008  . DEPRESSION, HX OF 04/21/2008  . PEPTIC ULCER DISEASE, HX OF 04/21/2008  . SMALL BOWEL OBSTRUCTION, HX OF 04/21/2008  . Benign essential HTN 01/26/2008  . Cannot sleep 01/26/2008  . Migraine with aura 01/26/2008    Past Medical History:  Diagnosis Date  . ADHD (attention deficit hyperactivity disorder) 05/02/2015  . Anemia   . Anxiety   . Arthritis   . Attention deficit disorder   . Autoimmune liver disease   . Barrett's esophagus   . COPD (chronic obstructive pulmonary disease) ([place])   . Depression   . Gastritis   . GERD (gastroesophageal reflux disease)   . Hypertension   . Insomnia   . Internal hemorrhoids   . Liver failure ([place])   . Migraine   . Ovarian failure   . Peptic ulcer disease   . Small bowel obstruction [facility])     Past Surgical History:  Procedure Laterality Date  . ABDOMINAL HYSTERECTOMY    . DILATION AND  CURETTAGE OF UTERUS    . LIVER BIOPSY    . OOPHORECTOMY      Social History   Tobacco Use  . Smoking status: Former Smoker    Last attempt to quit: 11/05/1988    Years since quitting: 29.5  . Smokeless tobacco: Never Used  Substance Use Topics  . Alcohol use: Yes    Alcohol/week: 4.2 oz    Types: 7 Glasses of wine per week     Current Outpatient Medications:  .  atenolol (TENORMIN) 100 MG tablet, TAKE 1 TABLET BY MOUTH TWICE  DAILY, Disp: 60 tablet, Rfl: 11 .  Cholecalciferol (VITAMIN D3) 5000 units CAPS, Take 1 capsule by mouth daily., Disp: , Rfl:  .  Cyanocobalamin (VITAMIN B 12 PO), Take 1,000 mcg by mouth daily., Disp: , Rfl:  .  loratadine (CLARITIN) 10 MG tablet, Take 10 mg by mouth daily., Disp: , Rfl:  .  losartan (COZAAR) 50 MG tablet, TAKE ONE TABLET BY MOUTH EVERY DAY, Disp: 30 tablet, Rfl: 2 .  SUMAtriptan (IMITREX) 100 MG tablet, Take 1 tablet (100 mg total) by mouth once for 1 dose. May repeat in 2 hours if headache persists or recurs. (Patient taking differently: Take 100 mg by mouth once as needed. May repeat in 2 hours if headache persists or recurs.), Disp: 9 tablet, Rfl: 3 .  SUMAtriptan (IMITREX) 20 MG/ACT nasal spray, Place 1 spray (20 mg total) into the nose once for 1 dose. May repeat in 2 hours if headache persists or recurs. (Patient taking differently: Place 20 mg into the nose once as needed. May repeat in 2 hours if headache persists or recurs.), Disp: 5 Inhaler, Rfl: 3 .  amLODipine (NORVASC) 10 MG tablet, Take 1 tablet (10 mg total) by mouth daily., Disp: 90 tablet, Rfl: 3 .  sulfamethoxazole-trimethoprim (BACTRIM DS,SEPTRA DS) 800-160 MG tablet, Take 1 tablet by mouth 2 (two) times daily., Disp: 6 tablet, Rfl: 0 .  Vitamin D, Ergocalciferol, (DRISDOL) 50000 units CAPS capsule, Take 50,000 Units by mouth every 7 (seven) days., Disp: , Rfl:   Allergies  Allergen Reactions  . Topamax [Topiramate] Other (See Comments)    "shut my body down."    ROS   Constitutional: Negative for fever or weight change.  Respiratory: Negative for cough and shortness of breath.   Cardiovascular: Negative for chest pain or palpitations.  Gastrointestinal: Negative for abdominal pain, no bowel changes.  Musculoskeletal: Positive for gait problem and right knee joint swelling.  Skin: Negative for rash.  Neurological: Negative for dizziness or headache.  No other specific complaints in a complete review of  systems (except as listed in HPI above).  Objective  Vitals:   05/09/18 1108 05/09/18 1149 05/09/18 1152  BP: (!) 190/100 (!) 180/100 (!) 180/110  Pulse: 61    Resp: 16    Temp: 97.8 F (36.6 C)    TempSrc: Oral    SpO2: 98%    Weight: 143 lb 14.4 oz (65.3 kg)    Height: 5' 5" (1.651 m)      Body mass index is 23.95 kg/m.  Nursing Note and Vital Signs reviewed.  Physical Exam   Constitutional: Patient appears well-developed and well-nourished. No distress.  HEENT: head atraumatic, normocephalic, Cardiovascular: Normal rate, regular rhythm, S1/S2 present.  No murmur or rub heard. Pulses intact, no carotid bruits Pulmonary/Chest: Effort normal and breath sounds clear. No respiratory distress or retractions. No stridor  Abdominal: Soft and non-tender, bowel sounds present, no CVA tenderness  MSK: right knee swelling, no redness, heat or otherwise obvious deformity. Pain with ROM  Psychiatric: Patient has a normal mood and affect. behavior is normal. Judgment and thought content normal.  Results for orders placed or performed during the hospital encounter of 05/07/18 (from the past 72 hour(s))  APTT     Status: None   Collection Time: 05/07/18  3:25 PM  Result Value Ref Range   aPTT 27 24 - 36 seconds    Comment: Performed at [facility], [address], [place], [place] [address]  Basic metabolic panel     Status: Abnormal   Collection Time: 05/07/18  3:25 PM  Result Value Ref Range   Sodium 138 135 - 145 mmol/L   Potassium 4.5 3.5 - 5.1 mmol/L   Chloride 103 98 - 111 mmol/L    Comment: Please note change in reference range.   CO2 24 22 - 32 mmol/L   Glucose, Bld 101 (H) 70 - 99 mg/dL    Comment: Please note change in reference range.   BUN 40 (H) 8 - 23 mg/dL    Comment: Please note change in reference range.   Creatinine, Ser 1.07 (H) 0.44 - 1.00 mg/dL   Calcium 9.6 8.9 - 10.3 mg/dL   GFR calc non Af Amer 54 (L) >60 mL/min   GFR calc Af Amer >60 >60  mL/min    Comment: (NOTE) The eGFR has been calculated using the CKD EPI equation. This calculation has not been validated in all clinical situations. eGFR's persistently <60 mL/min signify possible Chronic Kidney Disease.    Anion gap 11 5 - 15    Comment: Performed at [facility], [address] [place], [place] [address]  CBC     Status: Abnormal   Collection Time: 05/07/18  3:25 PM  Result Value Ref Range   WBC 4.0 3.6 - 11.0 K/uL   RBC 3.52 (L) 3.80 - 5.20 MIL/uL   Hemoglobin 13.0 12.0 - 16.0 g/dL   HCT 38.0 35.0 - 47.0 %   MCV 108.0 (H) 80.0 - 100.0 fL   MCH 37.0 (H) 26.0 - 34.0 pg   MCHC 34.2 32.0 - 36.0 g/dL   RDW 14.3 11.5 - 14.5 %   Platelets 216 150 - 440 K/uL    Comment: Performed at [facility], [address] [place], [place] [address]  Protime-INR     Status: None   Collection Time: 05/07/18  3:25 PM  Result Value Ref Range   Prothrombin Time 12.1 11.4 - 15.2 seconds   INR 0.90     Comment: Performed at [facility], [address] [place], [place] [address]  Urinalysis, Routine w reflex microscopic     Status: Abnormal   Collection Time: 05/07/18  3:25 PM  Result Value Ref Range   Color, Urine YELLOW (A) YELLOW   APPearance CLOUDY (A) CLEAR   Specific Gravity, Urine 1.020 1.005 - 1.030   pH 5.0 5.0 - 8.0   Glucose, UA NEGATIVE NEGATIVE mg/dL   Hgb urine dipstick NEGATIVE NEGATIVE   Bilirubin Urine NEGATIVE NEGATIVE   Ketones, ur NEGATIVE NEGATIVE mg/dL   Protein, ur NEGATIVE NEGATIVE mg/dL   Nitrite POSITIVE (A) NEGATIVE   Leukocytes, UA SMALL (A) NEGATIVE   RBC / HPF 0-5 0 - 5 RBC/hpf   WBC, UA >50 (H) 0 - 5 WBC/hpf   Bacteria, UA RARE (A) NONE SEEN   Squamous Epithelial / LPF 0-5 0 - 5  Mucus PRESENT     Comment: Performed at [facility], [address] [place], [place] [address]  Surgical pcr screen     Status: Abnormal   Collection Time: 05/07/18  3:25 PM  Result Value Ref Range   MRSA, PCR NEGATIVE  NEGATIVE   Staphylococcus aureus POSITIVE (A) NEGATIVE    Comment: (NOTE) The Xpert SA Assay (FDA approved for NASAL specimens in patients 2 years of age and older), is one component of a comprehensive surveillance program. It is not intended to diagnose infection nor to guide or monitor treatment. Performed at [facility], [address] [place], [place] [address]   Type and screen [place]     Status: None   Collection Time: 05/07/18  3:25 PM  Result Value Ref Range   ABO/RH(D) O POS    Antibody Screen NEG    Sample Expiration 05/21/2018    Extend sample reason      NO TRANSFUSIONS OR PREGNANCY IN THE PAST 3 MONTHS Performed at [facility], [address], [place], [place] [address]     Assessment & Plan  1. Benign essential HTN -uncontrolled; discussed stress reduction, cont medications add amlodipine, follow up on Monday with PCP to re-evaluate for surgical clearance, DASH diet  - amLODipine (NORVASC) 10 MG tablet; Take 1 tablet (10 mg total) by mouth daily.  Dispense: 90 tablet; Refill: 3  2. Attention deficit hyperactivity disorder (ADHD), predominantly hyperactive type -not presently on vyvanse, states has more difficulty concentrating   3. Chronic bronchitis, unspecified chronic bronchitis type (HCC) -stable  4. Macrocytosis without anemia Stable, continue vit b  5. Cystitis - Urine Culture - sulfamethoxazole-trimethoprim (BACTRIM DS,SEPTRA DS) 800-160 MG tablet; Take 1 tablet by mouth 2 (two) times daily.  Dispense: 6 tablet; Refill: 0   -Red flags and when to present for emergency care or RTC including fever >101.7F, chest pain, shortness of breath, new/worsening/un-resolving symptoms, Stroke S&S reviewed with patient at time of visit. Follow up and care instructions discussed and provided in AVS.  ------------------------------ I have reviewed this encounter including the documentation in this note and/or  discussed this patient with the provider, Suezanne Cheshire DNP AGNP-C. I am certifying that I agree with the content of this note as supervising physician. Enid Derry, [facility] 05/09/2018, 4:59 PM

## 2018-05-09 NOTE — Patient Instructions (Addendum)
- Follow up on Monday at 10:40 with Dr. Sherie Don - Take amlodipine in addition to your other blood pressure medications - Drink plenty of water ( recommended 64 ounces a day) - Take antibiotic twice a day with food on your stomach, recommend taking a probiotic as well.   Please do call to schedule your mammogram; the number to schedule one at either [facility] or [facility] is [phone]   DASH Eating Plan DASH stands for "Dietary Approaches to Stop Hypertension." The DASH eating plan is a healthy eating plan that has been shown to reduce high blood pressure (hypertension). It may also reduce your risk for type 2 diabetes, heart disease, and stroke. The DASH eating plan may also help with weight loss. What are tips for following this plan? General guidelines  Avoid eating more than 2,300 mg (milligrams) of salt (sodium) a day. If you have hypertension, you may need to reduce your sodium intake to 1,500 mg a day.  Limit alcohol intake to no more than 1 drink a day for nonpregnant women and 2 drinks a day for men. One drink equals 12 oz of beer, 5 oz of wine, or 1 oz of hard liquor.  Work with your health care provider to maintain a healthy body weight or to lose weight. Ask what an ideal weight is for you.  Get at least 30 minutes of exercise that causes your heart to beat faster (aerobic exercise) most days of the week. Activities may include walking, swimming, or biking.  Work with your health care provider or diet and nutrition specialist (dietitian) to adjust your eating plan to your individual calorie needs. Reading food labels  Check food labels for the amount of sodium per serving. Choose foods with less than 5 percent of the Daily Value of sodium. Generally, foods with less than 300 mg of sodium per serving fit into this eating plan.  To find whole grains, look for the word "whole" as the first word in the ingredient list. Shopping  Buy products  labeled as "low-sodium" or "no salt added."  Buy fresh foods. Avoid canned foods and premade or frozen meals. Cooking  Avoid adding salt when cooking. Use salt-free seasonings or herbs instead of table salt or sea salt. Check with your health care provider or pharmacist before using salt substitutes.  Do not fry foods. Cook foods using healthy methods such as baking, boiling, grilling, and broiling instead.  Cook with heart-healthy oils, such as olive, canola, soybean, or sunflower oil. Meal planning   Eat a balanced diet that includes: ? 5 or more servings of fruits and vegetables each day. At each meal, try to fill half of your plate with fruits and vegetables. ? Up to 6-8 servings of whole grains each day. ? Less than 6 oz of lean meat, poultry, or fish each day. A 3-oz serving of meat is about the same size as a deck of cards. One egg equals 1 oz. ? 2 servings of low-fat dairy each day. ? A serving of nuts, seeds, or beans 5 times each week. ? Heart-healthy fats. Healthy fats called Omega-3 fatty acids are found in foods such as flaxseeds and coldwater fish, like sardines, salmon, and mackerel.  Limit how much you eat of the following: ? Canned or prepackaged foods. ? Food that is high in trans fat, such as fried foods. ? Food that is high in saturated fat, such as fatty meat. ? Sweets, desserts, sugary drinks, and other  foods with added sugar. ? Full-fat dairy products.  Do not salt foods before eating.  Try to eat at least 2 vegetarian meals each week.  Eat more home-cooked food and less restaurant, buffet, and fast food.  When eating at a restaurant, ask that your food be prepared with less salt or no salt, if possible. What foods are recommended? The items listed may not be a complete list. Talk with your dietitian about what dietary choices are best for you. Grains Whole-grain or whole-wheat bread. Whole-grain or whole-wheat pasta. Brown rice. [place]row. Bulgur.  Whole-grain and low-sodium cereals. Pita bread. Low-fat, low-sodium crackers. Whole-wheat flour tortillas. Vegetables Fresh or frozen vegetables (raw, steamed, roasted, or grilled). Low-sodium or reduced-sodium tomato and vegetable juice. Low-sodium or reduced-sodium tomato sauce and tomato paste. Low-sodium or reduced-sodium canned vegetables. Fruits All fresh, dried, or frozen fruit. Canned fruit in natural juice (without added sugar). Meat and other protein foods Skinless chicken or [place]. Ground chicken or [place]. Pork with fat trimmed off. Fish and seafood. Egg whites. Dried beans, peas, or lentils. Unsalted nuts, nut butters, and seeds. Unsalted canned beans. Lean cuts of beef with fat trimmed off. Low-sodium, lean deli meat. Dairy Low-fat (1%) or fat-free (skim) milk. Fat-free, low-fat, or reduced-fat cheeses. Nonfat, low-sodium ricotta or cottage cheese. Low-fat or nonfat yogurt. Low-fat, low-sodium cheese. Fats and oils Soft margarine without trans fats. Vegetable oil. Low-fat, reduced-fat, or light mayonnaise and salad dressings (reduced-sodium). Canola, safflower, olive, soybean, and sunflower oils. Avocado. Seasoning and other foods Herbs. Spices. Seasoning mixes without salt. Unsalted popcorn and pretzels. Fat-free sweets. What foods are not recommended? The items listed may not be a complete list. Talk with your dietitian about what dietary choices are best for you. Grains Baked goods made with fat, such as croissants, muffins, or some breads. Dry pasta or rice meal packs. Vegetables Creamed or fried vegetables. Vegetables in a cheese sauce. Regular canned vegetables (not low-sodium or reduced-sodium). Regular canned tomato sauce and paste (not low-sodium or reduced-sodium). Regular tomato and vegetable juice (not low-sodium or reduced-sodium). Angie Fava. Olives. Fruits Canned fruit in a light or heavy syrup. Fried fruit. Fruit in cream or butter sauce. Meat and other protein  foods Fatty cuts of meat. Ribs. Fried meat. Berniece Salines. Sausage. Bologna and other processed lunch meats. Salami. Fatback. Hotdogs. Bratwurst. Salted nuts and seeds. Canned beans with added salt. Canned or smoked fish. Whole eggs or egg yolks. Chicken or [place] with skin. Dairy Whole or 2% milk, cream, and half-and-half. Whole or full-fat cream cheese. Whole-fat or sweetened yogurt. Full-fat cheese. Nondairy creamers. Whipped toppings. Processed cheese and cheese spreads. Fats and oils Butter. Stick margarine. Lard. Shortening. Ghee. Bacon fat. Tropical oils, such as coconut, palm kernel, or palm oil. Seasoning and other foods Salted popcorn and pretzels. Onion salt, garlic salt, seasoned salt, table salt, and sea salt. Worcestershire sauce. Tartar sauce. Barbecue sauce. Teriyaki sauce. Soy sauce, including reduced-sodium. Steak sauce. Canned and packaged gravies. Fish sauce. Oyster sauce. Cocktail sauce. Horseradish that you find on the shelf. Ketchup. Mustard. Meat flavorings and tenderizers. Bouillon cubes. Hot sauce and Tabasco sauce. Premade or packaged marinades. Premade or packaged taco seasonings. Relishes. Regular salad dressings. Where to find more information:  National Heart, Lung, and [place]: https://wilson-eaton.com/  American Heart Association: www.heart.org Summary  The DASH eating plan is a healthy eating plan that has been shown to reduce high blood pressure (hypertension). It may also reduce your risk for type 2 diabetes, heart disease, and stroke.  With  the DASH eating plan, you should limit salt (sodium) intake to 2,300 mg a day. If you have hypertension, you may need to reduce your sodium intake to 1,500 mg a day.  When on the DASH eating plan, aim to eat more fresh fruits and vegetables, whole grains, lean proteins, low-fat dairy, and heart-healthy fats.  Work with your health care provider or diet and nutrition specialist (dietitian) to adjust your eating plan to your  individual calorie needs. This information is not intended to replace advice given to you by your health care provider. Make sure you discuss any questions you have with your health care provider. Document Released: 10/11/2011 Document Revised: 10/15/2016 Document Reviewed: 10/15/2016 Elsevier Interactive Patient Education  [employer]2018 [employer].

## 2018-05-11 LAB — URINE CULTURE
MICRO NUMBER:: 90800485
SPECIMEN QUALITY:: ADEQUATE

## 2018-05-12 ENCOUNTER — Ambulatory Visit (INDEPENDENT_AMBULATORY_CARE_PROVIDER_SITE_OTHER): Payer: 59 | Admitting: Family Medicine

## 2018-05-12 ENCOUNTER — Ambulatory Visit: Payer: Self-pay | Admitting: Orthopedic Surgery

## 2018-05-12 ENCOUNTER — Encounter: Payer: Self-pay | Admitting: Family Medicine

## 2018-05-12 VITALS — BP 142/82 | HR 77 | Temp 98.5°F | Resp 14 | Wt 143.0 lb

## 2018-05-12 DIAGNOSIS — F5101 Primary insomnia: Secondary | ICD-10-CM | POA: Diagnosis not present

## 2018-05-12 DIAGNOSIS — E538 Deficiency of other specified B group vitamins: Secondary | ICD-10-CM

## 2018-05-12 DIAGNOSIS — M1731 Unilateral post-traumatic osteoarthritis, right knee: Secondary | ICD-10-CM

## 2018-05-12 DIAGNOSIS — D7589 Other specified diseases of blood and blood-forming organs: Secondary | ICD-10-CM

## 2018-05-12 DIAGNOSIS — G43101 Migraine with aura, not intractable, with status migrainosus: Secondary | ICD-10-CM

## 2018-05-12 DIAGNOSIS — I10 Essential (primary) hypertension: Secondary | ICD-10-CM

## 2018-05-12 DIAGNOSIS — Z789 Other specified health status: Secondary | ICD-10-CM | POA: Diagnosis not present

## 2018-05-12 DIAGNOSIS — Z7289 Other problems related to lifestyle: Secondary | ICD-10-CM | POA: Insufficient documentation

## 2018-05-12 DIAGNOSIS — A4901 Methicillin susceptible Staphylococcus aureus infection, unspecified site: Secondary | ICD-10-CM | POA: Insufficient documentation

## 2018-05-12 DIAGNOSIS — K2270 Barrett's esophagus without dysplasia: Secondary | ICD-10-CM | POA: Diagnosis not present

## 2018-05-12 DIAGNOSIS — F1090 Alcohol use, unspecified, uncomplicated: Secondary | ICD-10-CM | POA: Insufficient documentation

## 2018-05-12 LAB — COMPLETE METABOLIC PANEL WITH GFR
AG Ratio: 1.3 (calc) (ref 1.0–2.5)
ALBUMIN MSPROF: 4.5 g/dL (ref 3.6–5.1)
ALT: 28 U/L (ref 6–29)
AST: 39 U/L — AB (ref 10–35)
Alkaline phosphatase (APISO): 83 U/L (ref 33–130)
BUN/Creatinine Ratio: 30 (calc) — ABNORMAL HIGH (ref 6–22)
BUN: 29 mg/dL — AB (ref 7–25)
CALCIUM: 9.8 mg/dL (ref 8.6–10.4)
CO2: 23 mmol/L (ref 20–32)
CREATININE: 0.98 mg/dL (ref 0.50–0.99)
Chloride: 104 mmol/L (ref 98–110)
GFR, EST NON AFRICAN AMERICAN: 61 mL/min/{1.73_m2} (ref >=60)
GFR, Est African American: 71 mL/min/{1.73_m2} (ref >=60)
GLUCOSE: 100 mg/dL — AB (ref 65–99)
Globulin: 3.4 g/dL (calc) (ref 1.9–3.7)
Potassium: 5.7 mmol/L — ABNORMAL HIGH (ref 3.5–5.3)
SODIUM: 138 mmol/L (ref 135–146)
Total Bilirubin: 0.4 mg/dL (ref 0.2–1.2)
Total Protein: 7.9 g/dL (ref 6.1–8.1)

## 2018-05-12 LAB — VITAMIN B12: Vitamin B-12: >2000 pg/mL — ABNORMAL HIGH (ref 200–1100)

## 2018-05-12 MED ORDER — OMEPRAZOLE 20 MG PO CPDR: 20.0000 mg | DELAYED_RELEASE_CAPSULE | Freq: Every day | ORAL | 3 refills | Status: DC

## 2018-05-12 MED ORDER — TRAZODONE HCL 50 MG PO TABS: 25.0000 mg | ORAL_TABLET | Freq: Every evening | ORAL | 0 refills | Status: DC | PRN

## 2018-05-12 MED ORDER — VANCOMYCIN HCL IN DEXTROSE 1-5 GM/200ML-% IV SOLN
1000.0000 mg | INTRAVENOUS | Status: DC
  Filled 2018-05-12: qty 200

## 2018-05-12 NOTE — Progress Notes (Signed)
BP (!) 142/82   Pulse 77   Temp 98.5 F (36.9 C) (Oral)   Resp 14   Wt 143 lb (64.9 kg)   SpO2 97%   BMI 23.80 kg/m    Subjective:    Patient ID: Ashley [name], female    DOB: 1951/08/06, 67 y.o.   MRN: [id number]  HPI: Ashley [name] is a 67 y.o. female  Chief Complaint  Patient presents with  . Follow-up  . Hypertension    for pre op    HPI Patient is here for pre-operative clearance; having total knee surgery on 05/14/18  She saw Sharyon Cable, DNP on Friday; patient's BP was really high on Friday while here in the office; she says she checked it later that day at [facility], down to 138/84 there; very nervous and really wants this surgery; did not tolerate ambien; she was shaking in the office, says she is nervous that she won't be able to have this surgery; I asked about alcohol and she says she is drinking 3 glasses of wine per night to get to sleep; chronic insomnia; she says she will stop and does not think she'll any problems; no hx of DTs; she says that Dr. Odis Luster is aware of the alcohol amount, 3 glasses of wine per night  She will be spending the night in the hospital, staying in the hospital for a few days; home with PT, not to rehab center  She was started on an antibiotic on Friday for an E coli bladder infection; she was not having any symptoms; no burning; no urine odor or anything Culture reviewed and sensitive to antibiotic  Negative MRSA INR 0.9 MCV and MCH were quite high, 108 and 37 respectively; back in January, her B12 was mildly low at 314 (below 400); normal folic acid Last BUN was 40, Cr was 1.07 (up from 0.82) five months ago; GFR 54  Copied from Friday's note: High-risk surgery - No  History of ischemic heart disease (History of myocardial infarction (MI); history of positive exercise test; current chest pain considered due to myocardial ischemia; use of nitrate therapy or ECG with pathological Q waves)- No  History of congestive  heart failure (Pulmonary edema, bilateral rales or S3 gallop; paroxysmal nocturnal dyspnea; chest x-ray (CXR) showing pulmonary vascular redistribution)- N0  History of cerebrovascular disease (Prior transient ischemic attack (TIA) or stroke)- No Pre-operative treatment with insulin- No  Pre-operative creatinine >2 mg/dL / 409.8 mol/L - No  No chest pain, no cough Bruise on the right cheek from mosquito bite; no bleeding from elsewhere (denies nosebleeds, gum bleeding, blood in urine, or blood in stool)  BP Readings from Last 3 Encounters:  05/12/18 (!) 142/82  05/09/18 (!) 180/110  05/07/18 (!) 160/90   She is a Teacher, music, 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; she is still working; she is going to be out after surgery  Depression screen [facility] 2/9 05/09/2018 11/14/2017 08/06/2017 12/11/2016 09/10/2016  Decreased Interest 0 0 0 0 0  Down, Depressed, Hopeless 0 0 0 0 0  PHQ - 2 Score 0 0 0 0 0  Altered sleeping 1 - - - -  Tired, decreased energy 1 - - - -  Change in appetite 0 - - - -  Feeling bad or failure about yourself  0 - - - -  Trouble concentrating 0 - - - -  Moving slowly or fidgety/restless 0 - - - -  Suicidal thoughts 0 - - - -  PHQ-9 Score 2 - - - -  Difficult doing work/chores Somewhat difficult - - - -    Relevant past medical, surgical, family and social history reviewed Past Medical History:  Diagnosis Date  . ADHD (attention deficit hyperactivity disorder) 05/02/2015  . Anemia   . Anxiety   . Arthritis   . Attention deficit disorder   . Autoimmune liver disease   . Barrett's esophagus   . COPD (chronic obstructive pulmonary disease) (HCC)   . Depression   . Gastritis   . GERD (gastroesophageal reflux disease)   . Hypertension   . Insomnia   . Internal hemorrhoids   . Liver failure (HCC)   . Migraine   . Ovarian failure   . Peptic ulcer disease   . Small bowel obstruction [facility])    Past Surgical History:  Procedure Laterality Date  . ABDOMINAL  HYSTERECTOMY    . DILATION AND CURETTAGE OF UTERUS    . LIVER BIOPSY    . OOPHORECTOMY     Family History  Problem Relation Age of Onset  . Heart failure Father   . Atrial fibrillation Mother   . Cerebral palsy Brother   . Heart attack Maternal Grandfather   . CAD Paternal Grandmother   . Colon cancer Neg Hx    Social History   Tobacco Use  . Smoking status: Former Smoker    Last attempt to quit: 11/05/1988    Years since quitting: 29.5  . Smokeless tobacco: Never Used  Substance Use Topics  . Alcohol use: Yes    Alcohol/week: 4.2 oz    Types: 7 Glasses of wine per week  . Drug use: No     Interim medical history since last visit reviewed. Allergies and medications reviewed  Review of Systems Per HPI unless specifically indicated above     Objective:    BP (!) 142/82   Pulse 77   Temp 98.5 F (36.9 C) (Oral)   Resp 14   Wt 143 lb (64.9 kg)   SpO2 97%   BMI 23.80 kg/m   Wt Readings from Last 3 Encounters:  05/12/18 143 lb (64.9 kg)  05/09/18 143 lb 14.4 oz (65.3 kg)  05/07/18 152 lb (68.9 kg)    Physical Exam  Constitutional: She appears well-developed and well-nourished. No distress.  Seated in wheelchair; visibly anxious with slight tremor  HENT:  Head: Normocephalic and atraumatic.  Eyes: EOM are normal. No scleral icterus.  Neck: No thyromegaly present.  Cardiovascular: Normal rate, regular rhythm and normal heart sounds.  No murmur heard. Pulmonary/Chest: Effort normal and breath sounds normal. No respiratory distress. She has no wheezes.  Abdominal: Soft. Bowel sounds are normal. She exhibits no distension.  Musculoskeletal: Normal range of motion. She exhibits no edema.  Neurological: She is alert. She displays tremor. She exhibits normal muscle tone.  Gait not assessed; seated in wheelchair  Skin: Skin is warm and dry. She is not diaphoretic. No pallor.     Bruise on RIGHT maxilla  Psychiatric: Her behavior is normal. Judgment and thought  content normal. Her mood appears anxious. She does not exhibit a depressed mood.  Good eye contact with examiner    Results for orders placed or performed in visit on 05/09/18  Urine Culture  Result Value Ref Range   MICRO NUMBER: [id number]    SPECIMEN QUALITY: ADEQUATE    Sample Source URINE    STATUS: FINAL    ISOLATE 1: Escherichia coli (A)  Susceptibility   Escherichia coli - URINE CULTURE, REFLEX    AMOX/CLAVULANIC <=2 Sensitive     AMPICILLIN <=2 Sensitive     AMPICILLIN/SULBACTAM <=2 Sensitive     CEFAZOLIN* <=4 Not Reportable      * For infections other than uncomplicated UTIcaused by E. coli, K. pneumoniae or P. mirabilis:Cefazolin is resistant if MIC > or = 8 mcg/mL.(Distinguishing susceptible versus intermediatefor isolates with MIC < or = 4 mcg/mL requiresadditional testing.)For uncomplicated UTI caused by E. coli,K. pneumoniae or P. mirabilis: Cefazolin issusceptible if MIC <32 mcg/mL and predictssusceptible to the oral agents cefaclor, cefdinir,cefpodoxime, cefprozil, cefuroxime, cephalexinand loracarbef.    CEFEPIME <=1 Sensitive     CEFTRIAXONE <=1 Sensitive     CIPROFLOXACIN >=4 Resistant     LEVOFLOXACIN >=8 Resistant     ERTAPENEM <=0.5 Sensitive     GENTAMICIN <=1 Sensitive     IMIPENEM <=0.25 Sensitive     NITROFURANTOIN <=16 Sensitive     PIP/TAZO <=4 Sensitive     TOBRAMYCIN <=1 Sensitive     TRIMETH/SULFA* <=20 Sensitive      * For infections other than uncomplicated UTIcaused by E. coli, K. pneumoniae or P. mirabilis:Cefazolin is resistant if MIC > or = 8 mcg/mL.(Distinguishing susceptible versus intermediatefor isolates with MIC < or = 4 mcg/mL requiresadditional testing.)For uncomplicated UTI caused by E. coli,K. pneumoniae or P. mirabilis: Cefazolin issusceptible if MIC <32 mcg/mL and predictssusceptible to the oral agents cefaclor, cefdinir,cefpodoxime, cefprozil, cefuroxime, cephalexinand loracarbef.Legend:S = Susceptible  I = IntermediateR = Resistant   NS = Not susceptible* = Not tested  NR = Not reported**NN = See antimicrobic comments      Assessment & Plan:   Problem List Items Addressed This Visit      Cardiovascular and Mediastinum   Migraine with aura    Do NOT use any Imitrex for now while BP is high; only safe to use if well-controlled      Relevant Medications   traZODone (DESYREL) 50 MG tablet   Benign essential HTN    Better controlled with medicine; encouraged DASH guidelines and stress reduction; stopping alcohol should help        Digestive   BARRETTS ESOPHAGUS    Patient admits to being off of her PPI; start back; she agrees to see GI for surveillance after her knee is taken care of        Musculoskeletal and Integument   Arthritis of knee, degenerative - Primary    Planning for total replacement; will let Dr. Odis Luster know about today's visit; low risk surgery being a total knee replacement, and blood pressure much better controlled than last week; however, with her drinking 3 glasses of wine a night, watch for alcohol withdrawal, labile blood pressures during peri-operative and post-operative period        Other   Vitamin B12 deficiency   Relevant Orders   Vitamin B12   Macrocytosis without anemia    I am concerned that this is due to excessive alcohol consumption; she says she will quit drinking; plan to recheck labs at f/u soon; if not improving, then pursue work-up (SPEP, UPEP, etc.)      Cannot sleep    Chronic problem; she will STOP using alcohol for this; trazodone prescribed instead      Alcohol use    Patient drinking excessively, 21 alcoholic drinks per week; she reports that her orthopaedic surgeon is aware of this amount; she states that she will quit drinking and does not believe she  will have any problems stopping; see AVS, and I have supplied numbers for her should she decide to seek help; I am available if needed, as well, and encourged her to call me if needed; anesthesiologist and  orthopaedist should be aware of how much she is drinking, as this will incur some risk in the peri-operative and post-operative periods should she develop withdrawal; medical consultation in the hospital and close monitoring recommended, with use of benzos for withdrawal symptoms per hospital protocol or hospitalist's judgment      Relevant Orders   COMPLETE METABOLIC PANEL WITH GFR       Follow up plan: Return in about 1 month (around 06/09/2018).  An after-visit summary was printed and given to the patient at check-out.  Please see the patient instructions which may contain other information and recommendations beyond what is mentioned above in the assessment and plan.  Meds ordered this encounter  Medications  . traZODone (DESYREL) 50 MG tablet    Sig: Take 0.5-1 tablets (25-50 mg total) by mouth at bedtime as needed for sleep.    Dispense:  30 tablet    Refill:  0  . omeprazole (PRILOSEC) 20 MG capsule    Sig: Take 1 capsule (20 mg total) by mouth daily.    Dispense:  30 capsule    Refill:  3    Orders Placed This Encounter  Procedures  . COMPLETE METABOLIC PANEL WITH GFR  . Vitamin B12

## 2018-05-12 NOTE — Pre-Procedure Instructions (Signed)
FAXED ABN LABS TO DR Odis LusterBOWERS

## 2018-05-12 NOTE — Assessment & Plan Note (Signed)
I am concerned that this is due to excessive alcohol consumption; she says she will quit drinking; plan to recheck labs at f/u soon; if not improving, then pursue work-up (SPEP, UPEP, etc.)

## 2018-05-12 NOTE — Patient Instructions (Addendum)
We'll get labs today I'll contact you with the results I'll talk with Dr. Odis LusterBowers Use the new medicine to help with sleep Call me with any issues No alcohol "one day at a time" Have your blood pressure checked at [facility] later today and give us a call with the reading  Try to follow the DASH guidelines (DASH stands for Dietary Approaches to Stop Hypertension). Try to limit the sodium in your diet to no more than 1,500mg  of sodium per day. Certainly try to not exceed 2,000 mg per day at the very most. Do not add salt when cooking or at the table.  Check the sodium amount on labels when shopping, and choose items lower in sodium when given a choice. Avoid or limit foods that already contain a lot of sodium. Eat a diet rich in fruits and vegetables and whole grains, and try to lose weight if overweight or obese    Steps to Elicit the Relaxation Response The following is the technique reprinted with permission from Dr. Billy FischerHerbert Benson's book The Relaxation Response pages 162-163 1. Sit quietly in a comfortable position. 2. Close your eyes. 3. Deeply relax all your muscles,  beginning at your feet and progressing up to your face.  Keep them relaxed. 4. Breathe through your nose.  Become aware of your breathing.  As you breathe out, say the word, "one"*,  silently to yourself. For example,  breathe in ... out, "one",- in .. out, "one", etc.  Breathe easily and naturally. 5. Continue for 10 to 20 minutes.  You may open your eyes to check the time, but do not use an alarm.  When you finish, sit quietly for several minutes,  at first with your eyes closed and later with your eyes opened.  Do not stand up for a few minutes. 6. Do not worry about whether you are successful  in achieving a deep level of relaxation.  Maintain a passive attitude and permit relaxation to occur at its own pace.  When distracting thoughts occur,  try to ignore them by not dwelling upon them  and return to  repeating "one."  With practice, the response should come with little effort.  Practice the technique once or twice daily,  but not within two hours after any meal,  since the digestive processes seem to interfere with  the elicitation of the Relaxation Response. * It is better to use a soothing, mellifluous sound, preferably with no meaning. or association, to avoid stimulation of unnecessary thoughts - a mantra.   12 Ways to Curb Anxiety  ?Anxiety is normal human sensation. It is what helped our ancestors survive the pitfalls of the wilderness. Anxiety is defined as experiencing worry or nervousness about an imminent event or something with an uncertain outcome. It is a feeling experienced by most people at some point in their lives. Anxiety can be triggered by a very personal issue, such as the illness of a loved one, or an event of global proportions, such as a refugee crisis. Some of the symptoms of anxiety are:  Feeling restless.  Having a feeling of impending danger.  Increased heart rate.  Rapid breathing. Sweating.  Shaking.  Weakness or feeling tired.  Difficulty concentrating on anything except the current worry.  Insomnia.  Stomach or bowel problems. What can we do about anxiety we may be feeling? There are many techniques to help manage stress and relax. Here are 12 ways you can reduce your anxiety almost immediately: 1. Turn  off the constant feed of information. Take a social media sabbatical. Studies have shown that social media directly contributes to social anxiety.  2. Monitor your television viewing habits. Are you watching shows that are also contributing to your anxiety, such as 24-hour news stations? Try watching something else, or better yet, nothing at all. Instead, listen to music, read an inspirational book or practice a hobby. 3. Eat nutritious meals. Also, don't skip meals and keep healthful snacks on hand. Hunger and poor diet contributes to feeling  anxious. 4. Sleep. Sleeping on a regular schedule for at least seven to eight hours a night will do wonders for your outlook when you are awake. 5. Exercise. Regular exercise will help rid your body of that anxious energy and help you get more restful sleep. 6. Try deep (diaphragmatic) breathing. Inhale slowly through your nose for five seconds and exhale through your mouth. 7. Practice acceptance and gratitude. When anxiety hits, accept that there are things out of your control that shouldn't be of immediate concern.  8. Seek out humor. When anxiety strikes, watch a funny video, read jokes or call a friend who makes you laugh. Laughter is healing for our bodies and releases endorphins that are calming. 9. Stay positive. Take the effort to replace negative thoughts with positive ones. Try to see a stressful situation in a positive light. Try to come up with solutions rather than dwelling on the problem. 10. Figure out what triggers your anxiety. Keep a journal and make note of anxious moments and the events surrounding them. This will help you identify triggers you can avoid or even eliminate. 11. Talk to someone. Let a trusted friend, family member or even trained professional know that you are feeling overwhelmed and anxious. Verbalize what you are feeling and why.  12. Volunteer. If your anxiety is triggered by a crisis on a large scale, become an advocate and work to resolve the problem that is causing you unease. Anxiety is often unwelcome and can become overwhelming. If not kept in check, it can become a disorder that could require medical treatment. However, if you take the time to care for yourself and avoid the triggers that make you anxious, you will be able to find moments of relaxation and clarity that make your life much more enjoyable.    Alcohol Use Disorder Alcohol use disorder is when your drinking disrupts your daily life. When you have this condition, you drink too much alcohol and  you cannot control your drinking. Alcohol use disorder can cause serious problems with your physical health. It can affect your brain, heart, liver, pancreas, immune system, stomach, and intestines. Alcohol use disorder can increase your risk for certain cancers and cause problems with your mental health, such as depression, anxiety, psychosis, delirium, and dementia. People with this disorder risk hurting themselves and others. What are the causes? This condition is caused by drinking too much alcohol over time. It is not caused by drinking too much alcohol only one or two times. Some people with this condition drink alcohol to cope with or escape from negative life events. Others drink to relieve pain or symptoms of mental illness. What increases the risk? You are more likely to develop this condition if:  You have a family history of alcohol use disorder.  Your culture encourages drinking to the point of intoxication, or makes alcohol easy to get.  You had a mood or conduct disorder in childhood.  You have been a victim of abuse.  You are an adolescent and: ? You have poor grades or difficulties in school. ? Your caregivers do not talk to you about saying no to alcohol, or supervise your activities. ? You are impulsive or you have trouble with self-control.  What are the signs or symptoms? Symptoms of this condition include:  Drinkingmore than you want to.  Drinking for longer than you want to.  Trying several times to drink less or to control your drinking.  Spending a lot of time getting alcohol, drinking, or recovering from drinking.  Craving alcohol.  Having problems at work, at school, or at home due to drinking.  Having problems in relationships due to drinking.  Drinking when it is dangerous to drink, such as before driving a car.  Continuing to drink even though you know you might have a physical or mental problem related to drinking.  Needing more and more  alcohol to get the same effect you want from the alcohol (building up tolerance).  Having symptoms of withdrawal when you stop drinking. Symptoms of withdrawal include: ? Fatigue. ? Nightmares. ? Trouble sleeping. ? Depression. ? Anxiety. ? Fever. ? Seizures. ? Severe confusion. ? Feeling or seeing things that are not there (hallucinations). ? Tremors. ? Rapid heart rate. ? Rapid breathing. ? High blood pressure.  Drinking to avoid symptoms of withdrawal.  How is this diagnosed? This condition is diagnosed with an assessment. Your health care provider may start the assessment by asking three or four questions about your drinking. Your health care provider may perform a physical exam or do lab tests to see if you have physical problems resulting from alcohol use. She or he may refer you to a mental health professional for evaluation. How is this treated? Some people with alcohol use disorder are able to reduce their alcohol use to low-risk levels. Others need to completely quit drinking alcohol. When necessary, mental health professionals with specialized training in substance use treatment can help. Your health care provider can help you decide how severe your alcohol use disorder is and what type of treatment you need. The following forms of treatment are available:  Detoxification. Detoxification involves quitting drinking and using prescription medicines within the first week to help lessen withdrawal symptoms. This treatment is important for people who have had withdrawal symptoms before and for heavy drinkers who are likely to have withdrawal symptoms. Alcohol withdrawal can be dangerous, and in severe cases, it can cause death. Detoxification may be provided in a home, community, or primary care setting, or in a hospital or substance use treatment facility.  Counseling. This treatment is also called talk therapy. It is provided by substance use treatment counselors. A counselor can  address the reasons you use alcohol and suggest ways to keep you from drinking again or to prevent problem drinking. The goals of talk therapy are to: ? Find healthy activities and ways for you to cope with stress. ? Identify and avoid the things that trigger your alcohol use. ? Help you learn how to handle cravings.  Medicines.Medicines can help treat alcohol use disorder by: ? Decreasing alcohol cravings. ? Decreasing the positive feeling you have when you drink alcohol. ? Causing an uncomfortable physical reaction when you drink alcohol (aversion therapy).  Support groups. Support groups are led by people who have quit drinking. They provide emotional support, advice, and guidance.  These forms of treatment are often combined. Some people with this condition benefit from a combination of treatments provided by specialized substance  use treatment centers. Follow these instructions at home:  Take over-the-counter and prescription medicines only as told by your health care provider.  Check with your health care provider before starting any new medicines.  Ask friends and family members not to offer you alcohol.  Avoid situations where alcohol is served, including gatherings where others are drinking alcohol.  Create a plan for what to do when you are tempted to use alcohol.  Find hobbies or activities that you enjoy that do not include alcohol.  Keep all follow-up visits as told by your health care provider. This is important. How is this prevented?  If you drink, limit alcohol intake to no more than 1 drink a day for nonpregnant women and 2 drinks a day for men. One drink equals 12 oz of beer, 5 oz of wine, or 1 oz of hard liquor.  If you have a mental health condition, get treatment and support.  Do not give alcohol to adolescents.  If you are an adolescent: ? Do not drink alcohol. ? Do not be afraid to say no if someone offers you alcohol. Speak up about why you do not want  to drink. You can be a positive role model for your friends and set a good example for those around you by not drinking alcohol. ? If your friends drink, spend time with others who do not drink alcohol. Make new friends who do not use alcohol. ? Find healthy ways to manage stress and emotions, such as meditation or deep breathing, exercise, spending time in nature, listening to music, or talking with a trusted friend or family member. Contact a health care provider if:  You are not able to take your medicines as told.  Your symptoms get worse.  You return to drinking alcohol (relapse) and your symptoms get worse. Get help right away if:  You have thoughts about hurting yourself or others. If you ever feel like you may hurt yourself or others, or have thoughts about taking your own life, get help right away. You can go to your nearest emergency department or call:  Your local emergency services (911 in the [place]  A suicide crisis helpline, such as the National Suicide Prevention Lifeline at 901-651-4833. This is open 24 hours a day.  Summary  Alcohol use disorder is when your drinking disrupts your daily life. When you have this condition, you drink too much alcohol and you cannot control your drinking.  Treatment may include detoxification, counseling, medicine, and support groups.  Ask friends and family members not to offer you alcohol. Avoid situations where alcohol is served.  Get help right away if you have thoughts about hurting yourself or others. This information is not intended to replace advice given to you by your health care provider. Make sure you discuss any questions you have with your health care provider. Document Released: 11/29/2004 Document Revised: 07/19/2016 Document Reviewed: 07/19/2016 Elsevier Interactive Patient Education  [employer].  I encourage you to quit drinking, and I am here to help you There is a walk-in alcohol counselor who can meet  with you right now; his name is Rob and he works at [employer] Address: [address], [place], [place] [address]  Phone: [phone]  [facility] AA Meeting List  Including [place] and [place]  HOTLINE: [phone]  [facility] in [place] takes private insurance and you can walk-in there today or any time Address: [address], [place], [place] [address]  Phone: [phone]

## 2018-05-12 NOTE — Assessment & Plan Note (Signed)
Patient drinking excessively, 21 alcoholic drinks per week; she reports that her orthopaedic surgeon is aware of this amount; she states that she will quit drinking and does not believe she will have any problems stopping; see AVS, and I have supplied numbers for her should she decide to seek help; I am available if needed, as well, and encourged her to call me if needed; anesthesiologist and orthopaedist should be aware of how much she is drinking, as this will incur some risk in the peri-operative and post-operative periods should she develop withdrawal; medical consultation in the hospital and close monitoring recommended, with use of benzos for withdrawal symptoms per hospital protocol or hospitalist's judgment

## 2018-05-12 NOTE — Assessment & Plan Note (Signed)
Patient admits to being off of her PPI; start back; she agrees to see GI for surveillance after her knee is taken care of

## 2018-05-12 NOTE — Assessment & Plan Note (Signed)
Chronic problem; she will STOP using alcohol for this; trazodone prescribed instead

## 2018-05-12 NOTE — Assessment & Plan Note (Addendum)
Planning for total replacement; will let Dr. Odis LusterBowers know about today's visit; low risk surgery being a total knee replacement, and blood pressure much better controlled than last week; however, with her drinking 3 glasses of wine a night, watch for alcohol withdrawal, labile blood pressures during peri-operative and post-operative period

## 2018-05-12 NOTE — Assessment & Plan Note (Signed)
Better controlled with medicine; encouraged DASH guidelines and stress reduction; stopping alcohol should help

## 2018-05-12 NOTE — Assessment & Plan Note (Signed)
Do NOT use any Imitrex for now while BP is high; only safe to use if well-controlled

## 2018-05-13 ENCOUNTER — Other Ambulatory Visit
Admission: RE | Admit: 2018-05-13 | Discharge: 2018-05-13 | Disposition: A | Discharge status: Home / Self Care | Payer: 59 | Source: Ambulatory Visit | Attending: Family Medicine | Admitting: Family Medicine

## 2018-05-13 ENCOUNTER — Other Ambulatory Visit: Payer: Self-pay

## 2018-05-13 DIAGNOSIS — E875 Hyperkalemia: Secondary | ICD-10-CM | POA: Diagnosis not present

## 2018-05-13 LAB — BASIC METABOLIC PANEL
ANION GAP: 10 (ref 5–15)
BUN: 28 mg/dL — AB (ref 8–23)
CHLORIDE: 102 mmol/L (ref 98–111)
CO2: 22 mmol/L (ref 22–32)
Calcium: 9.4 mg/dL (ref 8.9–10.3)
Creatinine, Ser: 1.01 mg/dL — ABNORMAL HIGH (ref 0.44–1.00)
GFR calc non Af Amer: 58 mL/min — ABNORMAL LOW (ref >60)
Glucose, Bld: 91 mg/dL (ref 70–99)
POTASSIUM: 4.6 mmol/L (ref 3.5–5.1)
Sodium: 134 mmol/L — ABNORMAL LOW (ref 135–145)

## 2018-05-14 ENCOUNTER — Inpatient Hospital Stay: Payer: 59 | Admitting: Anesthesiology

## 2018-05-14 ENCOUNTER — Encounter: Payer: Self-pay | Admitting: Anesthesiology

## 2018-05-14 ENCOUNTER — Inpatient Hospital Stay: Payer: 59

## 2018-05-14 ENCOUNTER — Inpatient Hospital Stay
Admission: RE | Admit: 2018-05-14 | Discharge: 2018-05-17 | DRG: 470 | Disposition: A | Discharge status: Home Health | Payer: 59 | Attending: Orthopedic Surgery | Admitting: Orthopedic Surgery

## 2018-05-14 ENCOUNTER — Encounter
Admission: RE | Disposition: A | Discharge status: Home Health | Payer: Self-pay | Source: Home / Self Care | Attending: Orthopedic Surgery

## 2018-05-14 ENCOUNTER — Other Ambulatory Visit: Payer: Self-pay

## 2018-05-14 ENCOUNTER — Inpatient Hospital Stay: Admit: 2018-05-14 | Payer: Managed Care, Other (non HMO) | Admitting: Orthopedic Surgery

## 2018-05-14 DIAGNOSIS — R509 Fever, unspecified: Secondary | ICD-10-CM

## 2018-05-14 DIAGNOSIS — Z09 Encounter for follow-up examination after completed treatment for conditions other than malignant neoplasm: Secondary | ICD-10-CM

## 2018-05-14 DIAGNOSIS — Z87891 Personal history of nicotine dependence: Secondary | ICD-10-CM | POA: Diagnosis not present

## 2018-05-14 DIAGNOSIS — J449 Chronic obstructive pulmonary disease, unspecified: Secondary | ICD-10-CM | POA: Diagnosis present

## 2018-05-14 DIAGNOSIS — F418 Other specified anxiety disorders: Secondary | ICD-10-CM | POA: Diagnosis present

## 2018-05-14 DIAGNOSIS — Z96651 Presence of right artificial knee joint: Secondary | ICD-10-CM

## 2018-05-14 DIAGNOSIS — I10 Essential (primary) hypertension: Secondary | ICD-10-CM | POA: Diagnosis present

## 2018-05-14 DIAGNOSIS — M21061 Valgus deformity, not elsewhere classified, right knee: Secondary | ICD-10-CM | POA: Diagnosis present

## 2018-05-14 DIAGNOSIS — F909 Attention-deficit hyperactivity disorder, unspecified type: Secondary | ICD-10-CM | POA: Diagnosis present

## 2018-05-14 DIAGNOSIS — Z8711 Personal history of peptic ulcer disease: Secondary | ICD-10-CM | POA: Diagnosis not present

## 2018-05-14 DIAGNOSIS — K219 Gastro-esophageal reflux disease without esophagitis: Secondary | ICD-10-CM | POA: Diagnosis present

## 2018-05-14 DIAGNOSIS — M1711 Unilateral primary osteoarthritis, right knee: Secondary | ICD-10-CM | POA: Diagnosis present

## 2018-05-14 HISTORY — PX: TOTAL KNEE ARTHROPLASTY: SHX125

## 2018-05-14 HISTORY — DX: Presence of right artificial knee joint: Z96.651

## 2018-05-14 LAB — ABO/RH: ABO/RH(D): O POS

## 2018-05-14 SURGERY — ARTHROPLASTY, KNEE, TOTAL
Anesthesia: Choice | Laterality: Right

## 2018-05-14 SURGERY — ARTHROPLASTY, KNEE, TOTAL
Anesthesia: Spinal | Site: Knee | Laterality: Right | Wound class: "Clean "

## 2018-05-14 MED ORDER — VITAMIN D 1000 UNITS PO TABS
5000.0000 [IU] | ORAL_TABLET | Freq: Every day | ORAL | Status: DC
  Administered 2018-05-15 – 2018-05-17 (×3): 5000 [IU] via ORAL
  Filled 2018-05-14 (×3): qty 5

## 2018-05-14 MED ORDER — SUMATRIPTAN 20 MG/ACT NA SOLN
20.0000 mg | NASAL | Status: DC | PRN
  Filled 2018-05-14: qty 1

## 2018-05-14 MED ORDER — AMLODIPINE BESYLATE 10 MG PO TABS
10.0000 mg | ORAL_TABLET | Freq: Every day | ORAL | Status: DC
  Administered 2018-05-17: 10 mg via ORAL
  Filled 2018-05-14 (×3): qty 1

## 2018-05-14 MED ORDER — CEFAZOLIN SODIUM-DEXTROSE 2-4 GM/100ML-% IV SOLN: 2.0000 g | INTRAVENOUS | Status: DC

## 2018-05-14 MED ORDER — MIDAZOLAM HCL 2 MG/2ML IJ SOLN
INTRAMUSCULAR | Status: AC
  Filled 2018-05-14: qty 2

## 2018-05-14 MED ORDER — ATENOLOL 25 MG PO TABS
100.0000 mg | ORAL_TABLET | Freq: Two times a day (BID) | ORAL | Status: DC
  Administered 2018-05-15 – 2018-05-17 (×5): 100 mg via ORAL
  Filled 2018-05-14 (×6): qty 4

## 2018-05-14 MED ORDER — GABAPENTIN 300 MG PO CAPS
300.0000 mg | ORAL_CAPSULE | Freq: Three times a day (TID) | ORAL | Status: DC
  Administered 2018-05-14 – 2018-05-17 (×8): 300 mg via ORAL
  Filled 2018-05-14 (×8): qty 1

## 2018-05-14 MED ORDER — PROPOFOL 10 MG/ML IV BOLUS
INTRAVENOUS | Status: AC
  Filled 2018-05-14: qty 20

## 2018-05-14 MED ORDER — LACTATED RINGERS IV SOLN
INTRAVENOUS | Status: DC
  Administered 2018-05-14: 11:00:00 via INTRAVENOUS

## 2018-05-14 MED ORDER — LACTATED RINGERS IV SOLN: INTRAVENOUS | Status: DC

## 2018-05-14 MED ORDER — ASPIRIN 81 MG PO CHEW
81.0000 mg | CHEWABLE_TABLET | Freq: Two times a day (BID) | ORAL | Status: DC
  Administered 2018-05-15 – 2018-05-17 (×7): 81 mg via ORAL
  Filled 2018-05-14 (×7): qty 1

## 2018-05-14 MED ORDER — OXYCODONE HCL 5 MG PO TABS
5.0000 mg | ORAL_TABLET | ORAL | Status: DC | PRN
  Administered 2018-05-14 – 2018-05-15 (×2): 5 mg via ORAL
  Filled 2018-05-14 (×4): qty 1

## 2018-05-14 MED ORDER — BUPIVACAINE HCL (PF) 0.5 % IJ SOLN
INTRAMUSCULAR | Status: DC | PRN
  Administered 2018-05-14: 3 mL

## 2018-05-14 MED ORDER — LIDOCAINE HCL (PF) 1 % IJ SOLN
INTRAMUSCULAR | Status: AC
  Filled 2018-05-14: qty 5

## 2018-05-14 MED ORDER — FAMOTIDINE 20 MG PO TABS
20.0000 mg | ORAL_TABLET | Freq: Once | ORAL | Status: AC
  Administered 2018-05-14: 20 mg via ORAL

## 2018-05-14 MED ORDER — LORATADINE 10 MG PO TABS
10.0000 mg | ORAL_TABLET | Freq: Every day | ORAL | Status: DC
  Administered 2018-05-17: 10 mg via ORAL
  Filled 2018-05-14 (×3): qty 1

## 2018-05-14 MED ORDER — SODIUM CHLORIDE FLUSH 0.9 % IV SOLN
INTRAVENOUS | Status: AC
  Filled 2018-05-14: qty 10

## 2018-05-14 MED ORDER — MAGNESIUM CITRATE PO SOLN
1.0000 | Freq: Once | ORAL | Status: DC | PRN
  Filled 2018-05-14: qty 296

## 2018-05-14 MED ORDER — FENTANYL CITRATE (PF) 100 MCG/2ML IJ SOLN
INTRAMUSCULAR | Status: AC
  Administered 2018-05-14: 50 ug via INTRAVENOUS
  Filled 2018-05-14: qty 2

## 2018-05-14 MED ORDER — MIDAZOLAM HCL 2 MG/2ML IJ SOLN
INTRAMUSCULAR | Status: AC
  Administered 2018-05-14: 1 mg via INTRAVENOUS
  Filled 2018-05-14: qty 2

## 2018-05-14 MED ORDER — OXYCODONE HCL 5 MG PO TABS
10.0000 mg | ORAL_TABLET | ORAL | Status: DC | PRN
  Administered 2018-05-16 – 2018-05-17 (×5): 10 mg via ORAL
  Filled 2018-05-14 (×7): qty 2

## 2018-05-14 MED ORDER — SODIUM CHLORIDE 0.9 % IV SOLN
1000.0000 mg | INTRAVENOUS | Status: AC
  Administered 2018-05-14: 1000 mg via INTRAVENOUS
  Filled 2018-05-14: qty 1100

## 2018-05-14 MED ORDER — METOCLOPRAMIDE HCL 5 MG/ML IJ SOLN: 5.0000 mg | Freq: Three times a day (TID) | INTRAMUSCULAR | Status: DC | PRN

## 2018-05-14 MED ORDER — ACETAMINOPHEN 500 MG PO TABS
ORAL_TABLET | ORAL | Status: AC
  Filled 2018-05-14: qty 2

## 2018-05-14 MED ORDER — CEFAZOLIN SODIUM-DEXTROSE 2-4 GM/100ML-% IV SOLN
2.0000 g | INTRAVENOUS | Status: AC
  Administered 2018-05-14: 2 g via INTRAVENOUS

## 2018-05-14 MED ORDER — ONDANSETRON HCL 4 MG PO TABS: 4.0000 mg | ORAL_TABLET | Freq: Four times a day (QID) | ORAL | Status: DC | PRN

## 2018-05-14 MED ORDER — FENTANYL CITRATE (PF) 100 MCG/2ML IJ SOLN: 25.0000 ug | INTRAMUSCULAR | Status: DC | PRN

## 2018-05-14 MED ORDER — SODIUM CHLORIDE 0.9 % IV SOLN
INTRAVENOUS | Status: DC | PRN
  Administered 2018-05-14: 60 mL

## 2018-05-14 MED ORDER — SODIUM CHLORIDE 0.9 % IR SOLN
Status: DC | PRN
  Administered 2018-05-14: 2000 mL

## 2018-05-14 MED ORDER — ROPIVACAINE HCL 5 MG/ML IJ SOLN
INTRAMUSCULAR | Status: AC
  Filled 2018-05-14: qty 30

## 2018-05-14 MED ORDER — CEFAZOLIN SODIUM-DEXTROSE 1-4 GM/50ML-% IV SOLN
1.0000 g | Freq: Four times a day (QID) | INTRAVENOUS | Status: AC
  Administered 2018-05-15: 1 g via INTRAVENOUS
  Filled 2018-05-14 (×2): qty 50

## 2018-05-14 MED ORDER — SODIUM CHLORIDE FLUSH 0.9 % IV SOLN
INTRAVENOUS | Status: AC
  Filled 2018-05-14: qty 20

## 2018-05-14 MED ORDER — FENTANYL CITRATE (PF) 100 MCG/2ML IJ SOLN
INTRAMUSCULAR | Status: AC
  Filled 2018-05-14: qty 2

## 2018-05-14 MED ORDER — MIDAZOLAM HCL 2 MG/2ML IJ SOLN
1.0000 mg | Freq: Once | INTRAMUSCULAR | Status: AC
  Administered 2018-05-14: 1 mg via INTRAVENOUS

## 2018-05-14 MED ORDER — FAMOTIDINE 20 MG PO TABS
ORAL_TABLET | ORAL | Status: AC
  Administered 2018-05-14: 20 mg via ORAL
  Filled 2018-05-14: qty 1

## 2018-05-14 MED ORDER — BISACODYL 10 MG RE SUPP
10.0000 mg | Freq: Every day | RECTAL | Status: DC | PRN
  Administered 2018-05-16: 10 mg via RECTAL
  Filled 2018-05-14: qty 1

## 2018-05-14 MED ORDER — VITAMIN B-12 1000 MCG PO TABS
1000.0000 ug | ORAL_TABLET | Freq: Every day | ORAL | Status: DC
  Administered 2018-05-15 – 2018-05-17 (×3): 1000 ug via ORAL
  Filled 2018-05-14 (×3): qty 1

## 2018-05-14 MED ORDER — MORPHINE SULFATE (PF) 2 MG/ML IV SOLN
0.5000 mg | INTRAVENOUS | Status: DC | PRN
  Administered 2018-05-16: 1 mg via INTRAVENOUS
  Filled 2018-05-14: qty 1

## 2018-05-14 MED ORDER — CHLORHEXIDINE GLUCONATE 4 % EX LIQD: 60.0000 mL | Freq: Once | CUTANEOUS | Status: DC

## 2018-05-14 MED ORDER — FENTANYL CITRATE (PF) 100 MCG/2ML IJ SOLN
50.0000 ug | Freq: Once | INTRAMUSCULAR | Status: AC
  Administered 2018-05-14: 50 ug via INTRAVENOUS

## 2018-05-14 MED ORDER — ONDANSETRON HCL 4 MG/2ML IJ SOLN: 4.0000 mg | Freq: Once | INTRAMUSCULAR | Status: DC | PRN

## 2018-05-14 MED ORDER — TRANEXAMIC ACID 1000 MG/10ML IV SOLN
1000.0000 mg | INTRAVENOUS | Status: DC
  Filled 2018-05-14: qty 10

## 2018-05-14 MED ORDER — MIDAZOLAM HCL 5 MG/5ML IJ SOLN
INTRAMUSCULAR | Status: DC | PRN
  Administered 2018-05-14 (×2): 2 mg via INTRAVENOUS

## 2018-05-14 MED ORDER — GABAPENTIN 300 MG PO CAPS
300.0000 mg | ORAL_CAPSULE | Freq: Once | ORAL | Status: AC
  Administered 2018-05-14: 300 mg via ORAL

## 2018-05-14 MED ORDER — GABAPENTIN 300 MG PO CAPS: 300.0000 mg | ORAL_CAPSULE | Freq: Once | ORAL | Status: DC

## 2018-05-14 MED ORDER — ROPIVACAINE HCL 5 MG/ML IJ SOLN
INTRAMUSCULAR | Status: DC | PRN
  Administered 2018-05-14: 30 mL via EPIDURAL

## 2018-05-14 MED ORDER — TRAMADOL HCL 50 MG PO TABS
50.0000 mg | ORAL_TABLET | Freq: Four times a day (QID) | ORAL | Status: DC
  Administered 2018-05-15 – 2018-05-17 (×9): 50 mg via ORAL
  Filled 2018-05-14 (×11): qty 1

## 2018-05-14 MED ORDER — GABAPENTIN 300 MG PO CAPS
ORAL_CAPSULE | ORAL | Status: AC
  Administered 2018-05-14: 300 mg via ORAL
  Filled 2018-05-14: qty 1

## 2018-05-14 MED ORDER — PROPOFOL 10 MG/ML IV BOLUS
INTRAVENOUS | Status: DC | PRN
  Administered 2018-05-14: 20 mg via INTRAVENOUS

## 2018-05-14 MED ORDER — LACTATED RINGERS IV SOLN
INTRAVENOUS | Status: DC
  Administered 2018-05-14: 21:00:00 via INTRAVENOUS

## 2018-05-14 MED ORDER — KETOROLAC TROMETHAMINE 15 MG/ML IJ SOLN
15.0000 mg | Freq: Four times a day (QID) | INTRAMUSCULAR | Status: AC
  Administered 2018-05-14 – 2018-05-15 (×4): 15 mg via INTRAVENOUS
  Filled 2018-05-14 (×4): qty 1

## 2018-05-14 MED ORDER — ACETAMINOPHEN 500 MG PO TABS: 1000.0000 mg | ORAL_TABLET | Freq: Once | ORAL | Status: DC

## 2018-05-14 MED ORDER — ONDANSETRON HCL 4 MG/2ML IJ SOLN: 4.0000 mg | Freq: Four times a day (QID) | INTRAMUSCULAR | Status: DC | PRN

## 2018-05-14 MED ORDER — PHENOL 1.4 % MT LIQD
1.0000 | OROMUCOSAL | Status: DC | PRN
  Filled 2018-05-14: qty 177

## 2018-05-14 MED ORDER — PANTOPRAZOLE SODIUM 40 MG PO TBEC
40.0000 mg | DELAYED_RELEASE_TABLET | Freq: Every day | ORAL | Status: DC
  Administered 2018-05-15 – 2018-05-17 (×3): 40 mg via ORAL
  Filled 2018-05-14 (×3): qty 1

## 2018-05-14 MED ORDER — PROPOFOL 500 MG/50ML IV EMUL
INTRAVENOUS | Status: DC | PRN
  Administered 2018-05-14: 175 ug/kg/min via INTRAVENOUS

## 2018-05-14 MED ORDER — CEFAZOLIN SODIUM-DEXTROSE 2-4 GM/100ML-% IV SOLN
INTRAVENOUS | Status: AC
  Filled 2018-05-14: qty 100

## 2018-05-14 MED ORDER — VITAMIN D (ERGOCALCIFEROL) 1.25 MG (50000 UNIT) PO CAPS: 50000.0000 [IU] | ORAL_CAPSULE | ORAL | Status: DC

## 2018-05-14 MED ORDER — PROPOFOL 500 MG/50ML IV EMUL
INTRAVENOUS | Status: AC
  Filled 2018-05-14: qty 50

## 2018-05-14 MED ORDER — BUPIVACAINE-EPINEPHRINE (PF) 0.5% -1:200000 IJ SOLN
INTRAMUSCULAR | Status: DC | PRN
  Administered 2018-05-14: 30 mL via PERINEURAL

## 2018-05-14 MED ORDER — MENTHOL 3 MG MT LOZG
1.0000 | LOZENGE | OROMUCOSAL | Status: DC | PRN
  Filled 2018-05-14: qty 9

## 2018-05-14 MED ORDER — POLYETHYLENE GLYCOL 3350 17 G PO PACK: 17.0000 g | PACK | Freq: Every day | ORAL | Status: DC | PRN

## 2018-05-14 MED ORDER — DOCUSATE SODIUM 100 MG PO CAPS
100.0000 mg | ORAL_CAPSULE | Freq: Two times a day (BID) | ORAL | Status: DC
  Administered 2018-05-14 – 2018-05-17 (×5): 100 mg via ORAL
  Filled 2018-05-14 (×6): qty 1

## 2018-05-14 MED ORDER — TRAZODONE HCL 50 MG PO TABS
25.0000 mg | ORAL_TABLET | Freq: Every evening | ORAL | Status: DC | PRN
  Administered 2018-05-15: 50 mg via ORAL
  Filled 2018-05-14: qty 1

## 2018-05-14 MED ORDER — SUMATRIPTAN SUCCINATE 50 MG PO TABS
100.0000 mg | ORAL_TABLET | ORAL | Status: DC | PRN
  Filled 2018-05-14: qty 2

## 2018-05-14 MED ORDER — LACTATED RINGERS IV SOLN
INTRAVENOUS | Status: DC
  Administered 2018-05-14 (×2): via INTRAVENOUS

## 2018-05-14 MED ORDER — SODIUM CHLORIDE 0.9 % IV SOLN
INTRAVENOUS | Status: DC | PRN
  Administered 2018-05-14: 25 ug/min via INTRAVENOUS

## 2018-05-14 MED ORDER — LOSARTAN POTASSIUM 50 MG PO TABS
50.0000 mg | ORAL_TABLET | Freq: Every day | ORAL | Status: DC
  Administered 2018-05-15 – 2018-05-17 (×2): 50 mg via ORAL
  Filled 2018-05-14 (×3): qty 1

## 2018-05-14 MED ORDER — METOCLOPRAMIDE HCL 10 MG PO TABS: 5.0000 mg | ORAL_TABLET | Freq: Three times a day (TID) | ORAL | Status: DC | PRN

## 2018-05-14 SURGICAL SUPPLY — 58 items
BASEPLATE TIBIAL SZ 3 (Knees) IMPLANT
BLADE SAW 1 (BLADE) ×3 IMPLANT
BLADE SAW 18WX90L 1.27 THK (BLADE) ×3 IMPLANT
BLADE SAW SAG 25X90X1.19 (BLADE) ×3 IMPLANT
BOWL CEMENT MIX W/ADAPTER (MISCELLANEOUS) ×3 IMPLANT
BRUSH SCRUB EZ  4% CHG (MISCELLANEOUS) ×2
BRUSH SCRUB EZ 4% CHG (MISCELLANEOUS) ×2 IMPLANT
CANISTER SUCT 1200ML W/VALVE (MISCELLANEOUS) ×3 IMPLANT
CANISTER SUCT 3000ML PPV (MISCELLANEOUS) ×6 IMPLANT
CEMENT BONE 1-PACK (Cement) ×6 IMPLANT
CHLORAPREP W/TINT 26ML (MISCELLANEOUS) ×6 IMPLANT
COMP PATELLA GENESIS 29 OVAL (Orthopedic Implant) ×3 IMPLANT
COMPONENT FEM OXINIUM RT SZ4 (Knees) ×2 IMPLANT
COMPONENT PTLLA GENS 29 OVAL (Orthopedic Implant) IMPLANT
COOLER POLAR GLACIER W/PUMP (MISCELLANEOUS) ×3 IMPLANT
CUFF TOURN 30 STER DUAL PORT (MISCELLANEOUS) ×3 IMPLANT
DRAPE INCISE IOBAN 66X60 STRL (DRAPES) ×3 IMPLANT
DRAPE SHEET LG 3/4 BI-LAMINATE (DRAPES) ×3 IMPLANT
DRSG AQUACEL AG ADV 3.5X14 (GAUZE/BANDAGES/DRESSINGS) ×3 IMPLANT
ELECT REM PT RETURN 9FT ADLT (ELECTROSURGICAL) ×3
ELECTRODE REM PT RTRN 9FT ADLT (ELECTROSURGICAL) ×1 IMPLANT
GAUZE PETRO XEROFOAM 1X8 (MISCELLANEOUS) ×3 IMPLANT
GLOVE INDICATOR 8.0 STRL GRN (GLOVE) ×3 IMPLANT
GLOVE SURG ORTHO 8.0 STRL STRW (GLOVE) ×6 IMPLANT
GOWN STRL REUS W/ TWL LRG LVL3 (GOWN DISPOSABLE) ×2 IMPLANT
GOWN STRL REUS W/ TWL XL LVL3 (GOWN DISPOSABLE) ×1 IMPLANT
GOWN STRL REUS W/TWL LRG LVL3 (GOWN DISPOSABLE) ×4
GOWN STRL REUS W/TWL XL LVL3 (GOWN DISPOSABLE) ×2
HOOD PEEL AWAY FLYTE STAYCOOL (MISCELLANEOUS) ×9 IMPLANT
INSERT ARTI HI FLEX 9 SZ 3-4 (Insert) ×2 IMPLANT
IV NS 1000ML (IV SOLUTION) ×2
IV NS 1000ML BAXH (IV SOLUTION) ×1 IMPLANT
KIT TURNOVER KIT A (KITS) ×3 IMPLANT
MAT ABSORB  FLUID 56X50 GRAY (MISCELLANEOUS) ×2
MAT ABSORB FLUID 56X50 GRAY (MISCELLANEOUS) ×1 IMPLANT
NDL SAFETY ECLIPSE 18X1.5 (NEEDLE) ×1 IMPLANT
NDL SPNL 20GX3.5 QUINCKE YW (NEEDLE) ×1 IMPLANT
NEEDLE HYPO 18GX1.5 SHARP (NEEDLE) ×2
NEEDLE SPNL 20GX3.5 QUINCKE YW (NEEDLE) ×3 IMPLANT
NS IRRIG 1000ML POUR BTL (IV SOLUTION) ×3 IMPLANT
PACK TOTAL KNEE (MISCELLANEOUS) ×3 IMPLANT
PAD DE MAYO PRESSURE PROTECT (MISCELLANEOUS) ×3 IMPLANT
PAD WRAPON POLAR KNEE (MISCELLANEOUS) ×1 IMPLANT
PIN TROCAR 3 INCH (PIN) ×2 IMPLANT
PULSAVAC PLUS IRRIG FAN TIP (DISPOSABLE) ×3
STAPLER SKIN PROX 35W (STAPLE) ×3 IMPLANT
SUCTION FRAZIER HANDLE 10FR (MISCELLANEOUS) ×2
SUCTION TUBE FRAZIER 10FR DISP (MISCELLANEOUS) ×1 IMPLANT
SUT DVC 2 QUILL PDO  T11 36X36 (SUTURE) ×2
SUT DVC 2 QUILL PDO T11 36X36 (SUTURE) ×1 IMPLANT
SUT VIC AB 2-0 CT1 18 (SUTURE) ×3 IMPLANT
SUT VIC AB 2-0 CT1 27 (SUTURE) ×2
SUT VIC AB 2-0 CT1 TAPERPNT 27 (SUTURE) ×1 IMPLANT
SUT VIC AB PLUS 45CM 1-MO-4 (SUTURE) ×3 IMPLANT
SYR 30ML LL (SYRINGE) ×6 IMPLANT
TIBIAL BASEPLATE SZ 3 (Knees) ×3 IMPLANT
TIP FAN IRRIG PULSAVAC PLUS (DISPOSABLE) ×1 IMPLANT
WRAPON POLAR PAD KNEE (MISCELLANEOUS) ×3

## 2018-05-14 NOTE — H&P (Signed)
The patient has been re-examined, and the chart reviewed, and there have been no interval changes to the documented history and physical.  Plan a right total knee replacement today.  Anesthesia is consulted regarding a peripheral nerve block for post-operative pain.  The risks, benefits, and alternatives have been discussed at length, and the patient is willing to proceed.     

## 2018-05-14 NOTE — Transfer of Care (Signed)
Immediate Anesthesia Transfer of Care Note  Patient: Ashley PalmDebra M Lovins  Procedure(s) Performed: TOTAL KNEE ARTHROPLASTY (Right Knee)  Patient Location: PACU  Anesthesia Type:Spinal  Level of Consciousness: awake, alert  and oriented  Airway & Oxygen Therapy: Patient Spontanous Breathing and Patient connected to face mask oxygen  Post-op Assessment: Report given to RN and Post -op Vital signs reviewed and stable  Post vital signs: Reviewed and stable  Last Vitals:  Vitals Value Taken Time  BP    Temp    Pulse    Resp    SpO2      Last Pain:  Vitals:   05/14/18 1041  PainSc: 0-No pain         Complications: No apparent anesthesia complications

## 2018-05-14 NOTE — Anesthesia Procedure Notes (Signed)
Spinal  Patient location during procedure: OR Start time: 05/14/2018 12:46 PM End time: 05/14/2018 12:49 PM Staffing Anesthesiologist: Yves Dillarroll, Izeah Vossler, MD Performed: anesthesiologist  Preanesthetic Checklist Completed: patient identified, site marked, surgical consent, pre-op evaluation, timeout performed, IV checked, risks and benefits discussed and monitors and equipment checked Spinal Block Patient position: sitting Prep: Betadine and site prepped and draped Patient monitoring: heart rate, cardiac monitor, continuous pulse ox and blood pressure Approach: midline Location: L3-4 Injection technique: single-shot Needle Needle type: Whitacre  Needle gauge: 25 G Assessment Sensory level: T8 Additional Notes Time out called.  Patient placed in sitting position.  Back prepped and draped in sterile fashion.  A skin wheal was made in the L3-L4 interspace with 1% Lidocaine plain.  A 25G Whitacre needle was advanced with the return of clear, colorless CSF in all 4 quadrants.  No blood or paresthesias .  Patient tolerated the procedure well.

## 2018-05-14 NOTE — Op Note (Signed)
DATE OF SURGERY:  05/14/2018 TIME: 2:54 PM  PATIENT NAME:  Ashley [name]   AGE: 67 y.o.    PRE-OPERATIVE DIAGNOSIS:  osteoarthritis of right knee  POST-OPERATIVE DIAGNOSIS:  Same  PROCEDURE:  Procedure(s): TOTAL KNEE ARTHROPLASTY  SURGEON:  Lyndle Herrlich, MD   ASSISTANT:  Altamese Cabal, PA-C  OPERATIVE IMPLANTS: [employer]hew, Cruciate Retaining Femoral component size 4 right, Fixed Bearing Tray size 3 right, Patella polyethylene 3-peg oval button size 29 mm, with a 9 mm polyethylene high flex insert.   PREOPERATIVE INDICATIONS:  Ashley [name] is an 67 y.o. female who has a diagnosis of right knee osteoarthritis and valgus deformity and elected for a right total knee arthroplasty after failing nonoperative treatment, including activity modification, pain medication, physical therapy and injections who has significant impairment of their activities of daily living.  Radiographs have demonstrated tricompartmental osteoarthritis joint space narrowing, osteophytes, subchondral sclerosis and cyst formation.  The risks, benefits, and alternatives were discussed at length including but not limited to the risks of infection, bleeding, nerve or blood vessel injury, knee stiffness, fracture, dislocation, loosening or failure of the hardware and the need for further surgery. Medical risks include but not limited to DVT and pulmonary embolism, myocardial infarction, stroke, pneumonia, respiratory failure and death. I discussed these risks with the patient in my office prior to the date of surgery. They understood these risks and were willing to proceed.  OPERATIVE FINDINGS AND UNIQUE ASPECTS OF THE CASE:  All three compartments with advanced and severe degenerative changes, large osteophytes and an abundance of synovial fluid. Significant deformity was also noted. A decision was made to proceed with total knee arthroplasty.   OPERATIVE DESCRIPTION:  The patient was brought to the operative  room and placed in a supine position after undergoing placement of a general anesthetic. IV antibiotics were given. Patient received tranexamic acid. The lower extremity was prepped and draped in the usual sterile fashion.  A time out was performed to verify the patient's name, date of birth, medical record number, correct site of surgery and correct procedure to be performed. The timeout was also used to confirm the patient received antibiotics and that appropriate instruments, implants and radiographs studies were available in the room.  The leg was elevated and exsanguinated with an Esmarch and the tourniquet was inflated to 225 mmHg.  A midline incision was made over the left knee.. A medial parapatellar arthrotomy was then made and the patella subluxed laterally and the knee was brought into 90 of flexion. Hoffa's fat pad along with the anterior cruciate ligament was resected and the medial joint line was exposed.  Attention was then turned to preparation of the patella. The thickness of the patella was measured with a caliper, the diameter measured with the patella templates.  The patella resection was then made with an oscillating saw using the patella cutting guide.  The 29 mm button fit appropriately.  3 peg holes for the patella component were then drilled.  The extramedullary tibial cutting guide was then placed using the anterior tibial crest and second ray of the foot as a reference.  The tibial cutting guide was adjusted to allow for appropriate posterior slope.  The tibial cutting block was pinned into position. The slotted stylus was used to measure the proximal tibial resection of 2 mm off the low medial side. Care was taken during the tibial resection to protect the medial and collateral ligaments.  The resected tibial bone was removed.  The distal femur was resected using the TruMatch cutting guide.  Care was taken to protect the collateral ligaments during distal femoral resection.  The  distal femoral resection was performed with an oscillating saw. The femoral cutting guide was then removed. Extension gap was measured with a 9 mm spacer block and alignment and extension was confirmed using a long alignment rod. The femur was sized to be a 4. Rotation of the referencing guide was checked with the epicondylar axis and Whitesides line. Then the 4-in-1 cutting jig was then applied to the distal femur. A stylus was used to confirm that the anterior femur would not be notched.   Then the anterior, posterior and chamfer femoral cuts were then made with an oscillating saw.  All posterior osteophytes were removed.  The flexion gap was then measured with a flexion spacer block and long alignment rod and was found to be symmetric with the extension gap and perpendicular to mechanical axis of the tibia.  The proximal tibia plateau was then sized with trial trays. The best coverage was achieved with a size 3. This tibial tray was then pinned into position. The proximal tibia was then prepared with the reamer and keel punch.  After tibial preparation was completed, all trial components were inserted with polyethylene trials.  The knee was found to have excellent balance and full motion with a size 9 mm tibial polyethylene insert..    The trials were then placed. Knee was taken through a full range of motion and deemed to be stable with the trial components. All trial components were then removed.  The joint was copiously irrigated with pulse lavage.  The final total knee arthroplasty components were then cemented into place. The knee was held in extension while cement was allowed to cure.The knee was taken through a range of motion and the patella tracked well and the knee was again irrigated copiously.  The knee capsule was then injected with Exparel.  The medial arthrotomy was closed with #1 Vicryl and #2 Quill. The subcutaneous tissue closed with  2-0 vicryl, and skin approximated with staples.  A  dry sterile and compressive dressing was applied.  A Polar Care was applied to the operative knee.  The patient was awakened and brought to the PACU in stable and satisfactory condition.  All sharp, lap and instrument counts were correct at the conclusion the case. I spoke with the patient's family in the postop consultation room to let them know the case had been performed without complication and the patient was stable in recovery room.   Total tourniquet time was 56 minutes.

## 2018-05-14 NOTE — OR Nursing (Signed)
Patient has elevated liver enzymes back in jan 2019. Doctor that did labs did not want her taking tylenol. Per Dr Odis LusterBowers do not give tylenol,.

## 2018-05-14 NOTE — Anesthesia Post-op Follow-up Note (Signed)
Anesthesia QCDR form completed.        

## 2018-05-14 NOTE — NC FL2 (Signed)
[place] MEDICAID FL2 LEVEL OF CARE SCREENING TOOL     IDENTIFICATION  Patient Name: Ashley RoanDebra M Exline Birthdate: [birth year] Sex: female Admission Date (Current Location): 05/14/2018  [place]   Facility and Address:  [facility], [address], [place], [place] [address]      Provider Number: [id number]  Attending Physician Name and Address:  Lyndle HerrlichBowers, James R, MD  Relative Name and Phone Number:       Current Level of Care: Hospital Recommended Level of Care: Skilled Nursing Facility Prior Approval Number:    Date Approved/Denied:   PASRR Number: [id number])  Discharge Plan: SNF    Current Diagnoses: Patient Active Problem List   Diagnosis Date Noted  . Status post right knee replacement 05/14/2018  . Alcohol use 05/12/2018  . Vitamin B12 deficiency 05/12/2018  . Macrocytosis without anemia 09/10/2016  . Hand cramps 09/10/2016  . Medication monitoring encounter 09/10/2016  . Controlled substance agreement signed 04/10/2016  . Allergic rhinitis 09/19/2015  . Arthritis of knee, degenerative 09/19/2015  . ADHD (attention deficit hyperactivity disorder) 05/02/2015  . Special screening for malignant neoplasms, colon 02/02/2014  . CONSTIPATION 04/22/2008  . INTERNAL HEMORRHOIDS 04/21/2008  . COPD (chronic obstructive pulmonary disease) (HCC) 04/21/2008  . BARRETTS ESOPHAGUS 04/21/2008  . DEPRESSION, HX OF 04/21/2008  . PEPTIC ULCER DISEASE, HX OF 04/21/2008  . SMALL BOWEL OBSTRUCTION, HX OF 04/21/2008  . Benign essential HTN 01/26/2008  . Cannot sleep 01/26/2008  . Migraine with aura 01/26/2008    Orientation RESPIRATION BLADDER Height & Weight     Self, Time, Situation, Place  Normal Continent Weight: 143 lb (64.9 kg) Height:  5\' 5"  (165.1 cm)  BEHAVIORAL SYMPTOMS/MOOD NEUROLOGICAL BOWEL NUTRITION STATUS      Continent Diet(Diet: Full Liquid to be Advanced. )  AMBULATORY STATUS COMMUNICATION OF NEEDS  Skin   Extensive Assist Verbally Surgical wounds(Incision: Right Knee. )                       Personal Care Assistance Level of Assistance  Bathing, Feeding, Dressing Bathing Assistance: Limited assistance Feeding assistance: Independent Dressing Assistance: Limited assistance     Functional Limitations Info  Sight, Hearing, Speech Sight Info: Adequate Hearing Info: Adequate Speech Info: Adequate    SPECIAL CARE FACTORS FREQUENCY  PT (By licensed PT), OT (By licensed OT)     PT Frequency: (5) OT Frequency: (5)            Contractures      Additional Factors Info  Code Status, Allergies Code Status Info: (Full Code. ) Allergies Info: (Topamax Topiramate, Tylenol Acetaminophen)           Current Medications (05/14/2018):  This is the current hospital active medication list Current Facility-Administered Medications  Medication Dose Route Frequency Provider Last Rate Last Dose  . acetaminophen (TYLENOL) 500 MG tablet           . amLODipine (NORVASC) tablet 10 mg  10 mg Oral Daily Lyndle HerrlichBowers, James R, MD      . aspirin chewable tablet 81 mg  81 mg Oral BID Lyndle HerrlichBowers, James R, MD      . atenolol (TENORMIN) tablet 100 mg  100 mg Oral BID Lyndle HerrlichBowers, James R, MD      . bisacodyl (DULCOLAX) suppository 10 mg  10 mg Rectal Daily PRN Lyndle HerrlichBowers, James R, MD      . ceFAZolin (ANCEF) IVPB 1 g/50 mL premix  1 g Intravenous  Q6H Lyndle Herrlich, MD      . docusate sodium (COLACE) capsule 100 mg  100 mg Oral BID Lyndle Herrlich, MD      . gabapentin (NEURONTIN) capsule 300 mg  300 mg Oral TID Lyndle Herrlich, MD      . ketorolac (TORADOL) 15 MG/ML injection 15 mg  15 mg Intravenous Q6H Lyndle Herrlich, MD      . lactated ringers infusion   Intravenous Continuous Lyndle Herrlich, MD      . lidocaine (PF) (XYLOCAINE) 1 % injection           . loratadine (CLARITIN) tablet 10 mg  10 mg Oral Daily Lyndle Herrlich, MD      . losartan (COZAAR) tablet 50 mg  50 mg Oral Daily Lyndle Herrlich, MD       . magnesium citrate solution 1 Bottle  1 Bottle Oral Once PRN Lyndle Herrlich, MD      . menthol-cetylpyridinium (CEPACOL) lozenge 3 mg  1 lozenge Oral PRN Lyndle Herrlich, MD       Or  . phenol (CHLORASEPTIC) mouth spray 1 spray  1 spray Mouth/Throat PRN Lyndle Herrlich, MD      . metoCLOPramide (REGLAN) tablet 5-10 mg  5-10 mg Oral Q8H PRN Lyndle Herrlich, MD       Or  . metoCLOPramide (REGLAN) injection 5-10 mg  5-10 mg Intravenous Q8H PRN Lyndle Herrlich, MD      . morphine 2 MG/ML injection 0.5-1 mg  0.5-1 mg Intravenous Q2H PRN Lyndle Herrlich, MD      . ondansetron [facility] tablet 4 mg  4 mg Oral Q6H PRN Lyndle Herrlich, MD       Or  . ondansetron [facility]) injection 4 mg  4 mg Intravenous Q6H PRN Lyndle Herrlich, MD      . oxyCODONE (Oxy IR/ROXICODONE) immediate release tablet 10 mg  10 mg Oral Q3H PRN Lyndle Herrlich, MD      . oxyCODONE (Oxy IR/ROXICODONE) immediate release tablet 5 mg  5 mg Oral Q3H PRN Lyndle Herrlich, MD      . pantoprazole (PROTONIX) EC tablet 40 mg  40 mg Oral Daily Lyndle Herrlich, MD      . polyethylene glycol (MIRALAX / GLYCOLAX) packet 17 g  17 g Oral Daily PRN Lyndle Herrlich, MD      . sodium chloride flush 0.9 % injection           . SUMAtriptan (IMITREX) nasal spray 20 mg  20 mg Nasal Q2H PRN Lyndle Herrlich, MD      . SUMAtriptan (IMITREX) tablet 100 mg  100 mg Oral Once Lyndle Herrlich, MD      . traMADol Janean Sark) tablet 50 mg  50 mg Oral Q6H Lyndle Herrlich, MD      . traZODone (DESYREL) tablet 25-50 mg  25-50 mg Oral QHS PRN Lyndle Herrlich, MD      . Vitamin B 12 LOZG 1,000 mcg  1,000 mcg Oral Daily Lyndle Herrlich, MD      . Vitamin D (Ergocalciferol) (DRISDOL) capsule 50,000 Units  50,000 Units Oral Q7 days Lyndle Herrlich, MD      . Vitamin D3 CAPS 5,000 Units  1 capsule Oral Daily Lyndle Herrlich, MD       Facility-Administered Medications Ordered in Other Encounters  Medication Dose Route Frequency Provider Last Rate Last Dose  .  vancomycin (VANCOCIN) IVPB 1000 mg/200 mL premix  1,000 mg Intravenous 60 min Pre-Op Lyndle Herrlich, MD         Discharge Medications: Please see discharge summary for a list of discharge medications.  Relevant Imaging Results:  Relevant Lab Results:   Additional Information (SSN: [id number])  Treyana Sturgell, Darleen Crocker, LCSW

## 2018-05-14 NOTE — OR Nursing (Signed)
Patient having sips of soda

## 2018-05-14 NOTE — Anesthesia Procedure Notes (Signed)
Anesthesia Regional Block: Adductor canal block   Pre-Anesthetic Checklist: ,, timeout performed, Correct Patient, Correct Site, Correct Laterality, Correct Procedure, Correct Position, site marked, Risks and benefits discussed,  Surgical consent,  Pre-op evaluation,  At surgeon's request and post-op pain management  Laterality: Lower  Prep: chloraprep       Needles:  Injection technique: Single-shot  Needle Type: Echogenic Needle     Needle Length: 9cm  Needle Gauge: 21     Additional Needles:   Procedures:,,,, ultrasound used (permanent image in chart),,,,  Narrative:  Start time: 05/14/2018 12:05 PM End time: 05/14/2018 12:08 PM Injection made incrementally with aspirations every 5 mL.  Performed by: Personally  Anesthesiologist: Yves Dillarroll, Amiee Wiley, MD  Additional Notes: Functioning IV was confirmed and monitors were applied.  A echogenic needle was used. Sterile prep,hand hygiene and sterile gloves were used. Minimal sedation used for procedure.   No paresthesia endorsed by patient during the procedure.  Negative aspiration and negative test dose prior to incremental administration of local anesthetic. The patient tolerated the procedure well with no immediate complications.

## 2018-05-14 NOTE — Anesthesia Preprocedure Evaluation (Addendum)
Anesthesia Evaluation  Patient identified by MRN, date of birth, ID band  Airway Mallampati: III  TM Distance: <3 FB     Dental   Pulmonary COPD, former smoker,    Pulmonary exam normal        Cardiovascular hypertension, Normal cardiovascular exam     Neuro/Psych  Headaches, PSYCHIATRIC DISORDERS Anxiety Depression    GI/Hepatic PUD, GERD  Medicated,  Endo/Other    Renal/GU      Musculoskeletal  (+) Arthritis ,   Abdominal Normal abdominal exam  (+)   Peds  Hematology  (+) anemia ,   Anesthesia Other Findings   Reproductive/Obstetrics                            Anesthesia Physical Anesthesia Plan  ASA: III  Anesthesia Plan: Spinal   Post-op Pain Management:  Regional for Post-op pain   Induction: Intravenous  PONV Risk Score and Plan:   Airway Management Planned: Nasal Cannula  Additional Equipment:   Intra-op Plan:   Post-operative Plan:   Informed Consent: I have reviewed the patients History and Physical, chart, labs and discussed the procedure including the risks, benefits and alternatives for the proposed anesthesia with the patient or authorized representative who has indicated his/her understanding and acceptance.     Plan Discussed with: CRNA and Surgeon  Anesthesia Plan Comments:         Anesthesia Quick Evaluation

## 2018-05-15 LAB — CBC
HCT: 32 % — ABNORMAL LOW (ref 35.0–47.0)
Hemoglobin: 10.8 g/dL — ABNORMAL LOW (ref 12.0–16.0)
MCH: 37 pg — AB (ref 26.0–34.0)
MCHC: 33.7 g/dL (ref 32.0–36.0)
MCV: 109.9 fL — ABNORMAL HIGH (ref 80.0–100.0)
PLATELETS: 185 10*3/uL (ref 150–440)
RBC: 2.91 MIL/uL — ABNORMAL LOW (ref 3.80–5.20)
RDW: 14.3 % (ref 11.5–14.5)
WBC: 4 10*3/uL (ref 3.6–11.0)

## 2018-05-15 LAB — BASIC METABOLIC PANEL
Anion gap: 7 (ref 5–15)
BUN: 28 mg/dL — ABNORMAL HIGH (ref 8–23)
CO2: 24 mmol/L (ref 22–32)
CREATININE: 1.02 mg/dL — AB (ref 0.44–1.00)
Calcium: 8.5 mg/dL — ABNORMAL LOW (ref 8.9–10.3)
Chloride: 106 mmol/L (ref 98–111)
GFR calc Af Amer: >60 mL/min (ref >60)
GFR calc non Af Amer: 57 mL/min — ABNORMAL LOW (ref >60)
GLUCOSE: 125 mg/dL — AB (ref 70–99)
Potassium: 4.5 mmol/L (ref 3.5–5.1)
SODIUM: 137 mmol/L (ref 135–145)

## 2018-05-15 MED ORDER — LISDEXAMFETAMINE DIMESYLATE 20 MG PO CAPS
20.0000 mg | ORAL_CAPSULE | Freq: Every day | ORAL | Status: DC
Start: 2018-05-15 — End: 2018-05-17
  Administered 2018-05-17: 20 mg via ORAL
  Filled 2018-05-15 (×3): qty 1

## 2018-05-15 MED ORDER — LISDEXAMFETAMINE DIMESYLATE 30 MG PO CAPS
30.0000 mg | ORAL_CAPSULE | Freq: Every day | ORAL | Status: DC
  Administered 2018-05-17: 30 mg via ORAL
  Filled 2018-05-15 (×3): qty 1

## 2018-05-15 MED ORDER — ALPRAZOLAM 1 MG PO TABS
1.0000 mg | ORAL_TABLET | Freq: Once | ORAL | Status: AC
  Administered 2018-05-15: 1 mg via ORAL
  Filled 2018-05-15: qty 1

## 2018-05-15 MED ORDER — LISDEXAMFETAMINE DIMESYLATE 50 MG PO CAPS: 50.0000 mg | ORAL_CAPSULE | Freq: Every day | ORAL | Status: DC

## 2018-05-15 NOTE — Progress Notes (Signed)
Clinical Social Worker (CSW) received SNF consult. PT is recommending home health. RN case manager aware of above. Please reconsult if future social work needs arise. CSW signing off.   Samara Stankowski, LCSW [phone] 

## 2018-05-15 NOTE — Progress Notes (Signed)
  Subjective:  Patient reports pain as moderate.  Asking about her ADHD medication.  Objective:   VITALS:   Vitals:   05/14/18 2202 05/14/18 2304 05/15/18 0402 05/15/18 0834  BP: 94/68 113/77 104/67 129/82  Pulse: 65 67 67 70  Resp: 19 19 19    Temp: 98.1 F (36.7 C) 97.7 F (36.5 C) 97.9 F (36.6 C) (!) 97.5 F (36.4 C)  TempSrc: Oral Oral Oral Oral  SpO2: 95% 96% 94% 93%  Weight:      Height:        PHYSICAL EXAM:  Sensation intact distally Intact pulses distally Dorsiflexion/Plantar flexion intact Incision: dressing C/D/I Compartment soft  LABS  Results for orders placed or performed during the hospital encounter of 05/14/18 (from the past 24 hour(s))  ABO/Rh     Status: None   Collection Time: 05/14/18 11:00 AM  Result Value Ref Range   ABO/RH(D)      O POS Performed at [facility], [address] [place], [place] [address]   CBC     Status: Abnormal   Collection Time: 05/15/18  6:32 AM  Result Value Ref Range   WBC 4.0 3.6 - 11.0 K/uL   RBC 2.91 (L) 3.80 - 5.20 MIL/uL   Hemoglobin 10.8 (L) 12.0 - 16.0 g/dL   HCT 65.732.0 (L) 84.635.0 - 96.247.0 %   MCV 109.9 (H) 80.0 - 100.0 fL   MCH 37.0 (H) 26.0 - 34.0 pg   MCHC 33.7 32.0 - 36.0 g/dL   RDW 95.214.3 84.111.5 - 32.414.5 %   Platelets 185 150 - 440 K/uL  Basic metabolic panel     Status: Abnormal   Collection Time: 05/15/18  6:32 AM  Result Value Ref Range   Sodium 137 135 - 145 mmol/L   Potassium 4.5 3.5 - 5.1 mmol/L   Chloride 106 98 - 111 mmol/L   CO2 24 22 - 32 mmol/L   Glucose, Bld 125 (H) 70 - 99 mg/dL   BUN 28 (H) 8 - 23 mg/dL   Creatinine, Ser 4.011.02 (H) 0.44 - 1.00 mg/dL   Calcium 8.5 (L) 8.9 - 10.3 mg/dL   GFR calc non Af Amer 57 (L) >60 mL/min   GFR calc Af Amer >60 >60 mL/min   Anion gap 7 5 - 15    Dg Knee Right Port  Result Date: 05/14/2018 CLINICAL DATA:  Right knee arthroplasty EXAM: PORTABLE RIGHT KNEE - 1-2 VIEW COMPARISON:  None. FINDINGS: Expected postop change status post right total  knee arthroplasty with patellar resurfacing. Intra-articular emphysema and overlying anterior skin staples are noted. No immediate postoperative complications. Intact hardware without fracture. IMPRESSION: No immediate postoperative complications status post right total knee arthroplasty. Expected postop change of the soft tissues. Electronically Signed   By: Tollie Ethavid  Kwon M.D.   On: 05/14/2018 15:28    Assessment/Plan: 1 Day Post-Op   Active Problems:   Status post right knee replacement   Advance diet Up with therapy D/C IV fluids Plan for discharge tomorrow  Started Vyvanse   Lyndle HerrlichJames R Bowers , MD 05/15/2018, 8:41 AM

## 2018-05-15 NOTE — Progress Notes (Signed)
Physical Therapy Treatment Patient Details Name: Ashley [name] MRN: [id number] DOB: 22-Jun-1951 Today's Date: 05/15/2018    History of Present Illness Patient is 67 yo female s/p R TKA 05/14/18, WBAT with PMH of ADHD, COPD, HTN    PT Comments    Patient in bed at start of session with visitors at bedside, agreeable to therapy R knee pain 4/10. Patient demonstrates bed mobility with supervision, transfers with CGA and verbal/tactile cues for hand placement/technique. Ambulated 38800ft with RW, CGA and step through gait pattern, did exhibit slight trembling though patient asymptomatic. Patient able to ascend/descend 4 steps x2, with verbal cues/visual cues for technique. Patient may benefit from further stair navigation training to ensure learning. Patient able to complete exercises when returned to bed with minimal verbal cues. The patient was left in bed with all needs in reach.      Follow Up Recommendations  Home health PT     Equipment Recommendations  Rolling walker with 5" wheels    Recommendations for Other Services       Precautions / Restrictions Precautions Precautions: Knee Restrictions Weight Bearing Restrictions: Yes RLE Weight Bearing: Weight bearing as tolerated    Mobility  Bed Mobility Overal bed mobility: Needs Assistance Bed Mobility: Supine to Sit;Sit to Supine     Supine to sit: Supervision Sit to supine: Supervision      Transfers Overall transfer level: Needs assistance Equipment used: Rolling walker (2 wheeled) Transfers: Sit to/from Stand Sit to Stand: Min guard            Ambulation/Gait Ambulation/Gait assistance: Min guard Gait Distance (Feet): 300 Feet Assistive device: Rolling walker (2 wheeled) Gait Pattern/deviations: Step-through pattern     General Gait Details: Patient demonstrated slight trembling with ambulation this session, patient has no complaints of SOB, fatigue, states shes just a bit sore.   Stairs Stairs:  Yes Stairs assistance: Min guard Stair Management: One rail Left;Step to pattern;Forwards;Sideways Number of Stairs: 4 General stair comments: Patient would benefit from more education about ascending/descending stairs sideways.   Wheelchair Mobility    Modified Rankin (Stroke Patients Only)       Balance                                            Cognition                                              Exercises Total Joint Exercises Ankle Circles/Pumps: AROM;Strengthening;Both;20 reps Heel Slides: AROM;Strengthening;Right;20 reps Straight Leg Raises: AROM;Strengthening;Right;20 reps    General Comments        Pertinent Vitals/Pain Pain Score: 4  Pain Descriptors / Indicators: Sore Pain Intervention(s): Monitored during session;Premedicated before session;Repositioned;Ice applied    Home Living                      Prior Function            PT Goals (current goals can now be found in the care plan section) Acute Rehab PT Goals Patient Stated Goal: Patient wants to get home and wants to return to PLOF PT Goal Formulation: With patient Time For Goal Achievement: 05/29/18 Potential to Achieve Goals: Good Progress towards PT goals: Progressing toward goals  Frequency    BID      PT Plan Current plan remains appropriate    Co-evaluation              AM-PAC PT "6 Clicks" Daily Activity  Outcome Measure  Difficulty turning over in bed (including adjusting bedclothes, sheets and blankets)?: None Difficulty moving from lying on back to sitting on the side of the bed? : A Little Difficulty sitting down on and standing up from a chair with arms (e.g., wheelchair, bedside commode, etc,.)?: A Little Help needed moving to and from a bed to chair (including a wheelchair)?: A Little Help needed walking in hospital room?: A Little Help needed climbing 3-5 steps with a railing? : A Little 6 Click Score: 19     End of Session Equipment Utilized During Treatment: Gait belt Activity Tolerance: Patient tolerated treatment well Patient left: in bed;with call bell/phone within reach;with bed alarm set;with SCD's reapplied;with family/visitor present Nurse Communication: Mobility status PT Visit Diagnosis: Unsteadiness on feet (R26.81);Other abnormalities of gait and mobility (R26.89);History of falling (Z91.81);Muscle weakness (generalized) (M62.81)     Time: 0120-0146 PT Time Calculation (min) (ACUTE ONLY): 26 min  Charges:  $Therapeutic Exercise: 8-22 mins $Therapeutic Activity: 8-22 mins                    G Codes:       Olga Coaster PT, DPT 2:00 PM,05/15/18 [phone]

## 2018-05-15 NOTE — Anesthesia Postprocedure Evaluation (Signed)
Anesthesia Post Note  Patient: Ashley PalmDebra M [name]  Procedure(s) Performed: TOTAL KNEE ARTHROPLASTY (Right Knee)  Patient location during evaluation: Nursing Unit Anesthesia Type: Spinal Level of consciousness: awake and alert and oriented Pain management: satisfactory to patient Vital Signs Assessment: post-procedure vital signs reviewed and stable Respiratory status: respiratory function stable Cardiovascular status: stable Postop Assessment: no backache, no headache, spinal receding, no apparent nausea or vomiting, patient able to bend at knees, adequate PO intake and able to ambulate Anesthetic complications: no     Last Vitals:  Vitals:   05/14/18 2304 05/15/18 0402  BP: 113/77 104/67  Pulse: 67 67  Resp: 19 19  Temp: 36.5 C 36.6 C  SpO2: 96% 94%    Last Pain:  Vitals:   05/15/18 0751  TempSrc:   PainSc: 0-No pain                 Clydene PughBeane, Mead Slane D

## 2018-05-15 NOTE — Evaluation (Signed)
Physical Therapy Evaluation Patient Details Name: Ashley [name] MRN: [id number] DOB: Aug 17, 1951 Today's Date: 05/15/2018   History of Present Illness  Patient is 67 yo female s/p R TKA 05/14/18, WBAT with PMH of ADHD, COPD, HTN  Clinical Impression  Patient A&O x 4 at start of session, with no pain in R knee at rest, 4/10 pain with mobility. Patient reports independence for PLOF prior to admission, with intermittent use of support while ambulating (cane, walls, cart at work). The patient was able to complete therapeutic exercises with verbal cues from PT, with AROM. R knee extension: 6deg from neutral, R knee flexion: 90deg. Patient demonstrated bed mobility with supervision, transfers with CGA. Patient ambulated 25930ft with RW and CGA, verbal cues for pacing activity and AD management. Able to progress to step through pattern. The patient would benefit from further skilled PT to address limitations in gait, mobility, balance, ROM, strength, endurance and activity tolerance.      Follow Up Recommendations Home health PT    Equipment Recommendations  Rolling walker with 5" wheels    Recommendations for Other Services       Precautions / Restrictions Precautions Precautions: Knee Precaution Booklet Issued: Yes (comment) Restrictions Weight Bearing Restrictions: Yes RLE Weight Bearing: Weight bearing as tolerated      Mobility  Bed Mobility Overal bed mobility: Needs Assistance Bed Mobility: Supine to Sit     Supine to sit: Supervision        Transfers Overall transfer level: Needs assistance Equipment used: Rolling walker (2 wheeled) Transfers: Sit to/from Stand Sit to Stand: Min guard            Ambulation/Gait Ambulation/Gait assistance: Min guard Gait Distance (Feet): 230 Feet Assistive device: Rolling walker (2 wheeled) Gait Pattern/deviations: Step-to pattern;Step-through pattern     General Gait Details: Patient able to naturally progress to step through  gait pattern with minimal verbal cues from PT, occasional verbal cues for AD management  Stairs            Wheelchair Mobility    Modified Rankin (Stroke Patients Only)       Balance Overall balance assessment: Mild deficits observed, not formally tested                                           Pertinent Vitals/Pain Pain Assessment: 0-10 Pain Score: 0-No pain Pain Location: reports her pain is 4/10 with movement Pain Descriptors / Indicators: Sore Pain Intervention(s): Monitored during session;Premedicated before session;Repositioned;Ice applied    Home Living Family/patient expects to be discharged to:: Private residence Living Arrangements: Children;Non-relatives/Friends(16 yo daughter) Available Help at Discharge: Family;Friend(s) Type of Home: House Home Access: Stairs to enter Entrance Stairs-Rails: Left Entrance Stairs-Number of Steps: 4 STE Home Layout: One level Home Equipment: Bedside commode;Grab bars - tub/shower;Cane - single point;Walker - 2 wheels      Prior Function Level of Independence: Needs assistance;Independent with assistive device(s)   Gait / Transfers Assistance Needed: Patient was ambulating with support           Hand Dominance   Dominant Hand: Right    Extremity/Trunk Assessment   Upper Extremity Assessment Upper Extremity Assessment: Overall WFL for tasks assessed    Lower Extremity Assessment Lower Extremity Assessment: RLE deficits/detail;LLE deficits/detail RLE Deficits / Details: 3+/5 LLE Deficits / Details: 4/5       Communication   Communication: No  difficulties  Cognition Arousal/Alertness: Awake/alert Behavior During Therapy: WFL for tasks assessed/performed                                          General Comments      Exercises Total Joint Exercises Ankle Circles/Pumps: AROM;Strengthening;Both;20 reps Quad Sets: AROM;Strengthening;Both;20 reps Heel Slides:  AROM;Strengthening;Right;20 reps Straight Leg Raises: AROM;Strengthening;Right;20 reps Knee Flexion: AROM;Strengthening;20 reps Goniometric ROM: R knee extension: 6deg from neutral, R knee flexion: 91deg   Assessment/Plan    PT Assessment Patient needs continued PT services  PT Problem List Decreased strength;Pain;Decreased range of motion;Decreased activity tolerance;Decreased knowledge of use of DME;Decreased balance;Decreased safety awareness;Decreased mobility;Decreased knowledge of precautions       PT Treatment Interventions DME instruction;Therapeutic exercise;Gait training;Balance training;Stair training;Neuromuscular re-education;Therapeutic activities;Patient/family education;Functional mobility training    PT Goals (Current goals can be found in the Care Plan section)  Acute Rehab PT Goals Patient Stated Goal: Patient wants to get home and wants to return to PLOF PT Goal Formulation: With patient Time For Goal Achievement: 05/29/18 Potential to Achieve Goals: Good    Frequency BID   Barriers to discharge        Co-evaluation               AM-PAC PT "6 Clicks" Daily Activity  Outcome Measure Difficulty turning over in bed (including adjusting bedclothes, sheets and blankets)?: None Difficulty moving from lying on back to sitting on the side of the bed? : A Little Difficulty sitting down on and standing up from a chair with arms (e.g., wheelchair, bedside commode, etc,.)?: A Little Help needed moving to and from a bed to chair (including a wheelchair)?: A Little Help needed walking in hospital room?: A Little Help needed climbing 3-5 steps with a railing? : A Little 6 Click Score: 19    End of Session Equipment Utilized During Treatment: Gait belt Activity Tolerance: Patient tolerated treatment well Patient left: in chair;with chair alarm set;with call bell/phone within reach;with SCD's reapplied(heels elevated polar care applied) Nurse Communication:  Mobility status PT Visit Diagnosis: Unsteadiness on feet (R26.81);Other abnormalities of gait and mobility (R26.89);History of falling (Z91.81);Muscle weakness (generalized) (M62.81)    Time: 1610-9604 PT Time Calculation (min) (ACUTE ONLY): 44 min   Charges:   PT Evaluation $PT Eval Low Complexity: 1 Low PT Treatments $Gait Training: 8-22 mins $Therapeutic Exercise: 8-22 mins   PT G Codes:        Olga Coaster PT, DPT 9:22 AM,05/15/18 [phone]

## 2018-05-16 ENCOUNTER — Inpatient Hospital Stay: Payer: 59

## 2018-05-16 ENCOUNTER — Encounter: Payer: Self-pay | Admitting: Orthopedic Surgery

## 2018-05-16 LAB — CBC WITH DIFFERENTIAL/PLATELET
BASOS ABS: 0 10*3/uL (ref 0–0.1)
Basophils Relative: 1 %
EOS PCT: 1 %
Eosinophils Absolute: 0.1 10*3/uL (ref 0–0.7)
HCT: 30.1 % — ABNORMAL LOW (ref 35.0–47.0)
HEMOGLOBIN: 10.5 g/dL — AB (ref 12.0–16.0)
LYMPHS PCT: 14 %
Lymphs Abs: 0.9 10*3/uL — ABNORMAL LOW (ref 1.0–3.6)
MCH: 37.9 pg — ABNORMAL HIGH (ref 26.0–34.0)
MCHC: 34.7 g/dL (ref 32.0–36.0)
MCV: 109.3 fL — AB (ref 80.0–100.0)
Monocytes Absolute: 1.1 10*3/uL — ABNORMAL HIGH (ref 0.2–0.9)
Monocytes Relative: 16 %
NEUTROS PCT: 68 %
Neutro Abs: 4.5 10*3/uL (ref 1.4–6.5)
PLATELETS: 166 10*3/uL (ref 150–440)
RBC: 2.76 MIL/uL — AB (ref 3.80–5.20)
RDW: 14.1 % (ref 11.5–14.5)
WBC: 6.6 10*3/uL (ref 3.6–11.0)

## 2018-05-16 LAB — URINALYSIS, COMPLETE (UACMP) WITH MICROSCOPIC
BACTERIA UA: NONE SEEN
Bilirubin Urine: NEGATIVE
Glucose, UA: NEGATIVE mg/dL
Ketones, ur: NEGATIVE mg/dL
Leukocytes, UA: NEGATIVE
NITRITE: NEGATIVE
PROTEIN: NEGATIVE mg/dL
Specific Gravity, Urine: 1.017 (ref 1.005–1.030)
pH: 5 (ref 5.0–8.0)

## 2018-05-16 LAB — CBC
HCT: 32.2 % — ABNORMAL LOW (ref 35.0–47.0)
HEMOGLOBIN: 10.8 g/dL — AB (ref 12.0–16.0)
MCH: 37 pg — AB (ref 26.0–34.0)
MCHC: 33.6 g/dL (ref 32.0–36.0)
MCV: 110.2 fL — ABNORMAL HIGH (ref 80.0–100.0)
Platelets: 184 10*3/uL (ref 150–440)
RBC: 2.92 MIL/uL — ABNORMAL LOW (ref 3.80–5.20)
RDW: 14.5 % (ref 11.5–14.5)
WBC: 4.6 10*3/uL (ref 3.6–11.0)

## 2018-05-16 MED ORDER — ALPRAZOLAM 1 MG PO TABS: 1.0000 mg | ORAL_TABLET | Freq: Every evening | ORAL | 0 refills | Status: DC | PRN

## 2018-05-16 MED ORDER — ASPIRIN 81 MG PO CHEW: 81.0000 mg | CHEWABLE_TABLET | Freq: Two times a day (BID) | ORAL | 0 refills | Status: DC

## 2018-05-16 MED ORDER — OXYCODONE HCL 5 MG PO TABS: 5.0000 mg | ORAL_TABLET | ORAL | 0 refills | Status: DC | PRN

## 2018-05-16 MED ORDER — DOCUSATE SODIUM 100 MG PO CAPS: 100.0000 mg | ORAL_CAPSULE | Freq: Two times a day (BID) | ORAL | 0 refills | Status: DC

## 2018-05-16 MED ORDER — LISDEXAMFETAMINE DIMESYLATE 30 MG PO CAPS: 30.0000 mg | ORAL_CAPSULE | Freq: Every day | ORAL | 0 refills | Status: DC

## 2018-05-16 MED ORDER — ALPRAZOLAM 1 MG PO TABS
1.0000 mg | ORAL_TABLET | Freq: Every evening | ORAL | Status: DC | PRN
  Administered 2018-05-16: 1 mg via ORAL
  Filled 2018-05-16: qty 1

## 2018-05-16 NOTE — Progress Notes (Signed)
Bowers notified of elevated temp. New orders received for CXR, UA, and CBC.

## 2018-05-16 NOTE — Progress Notes (Addendum)
RN administered aspirin early for temp 102.5 per Dr. Odis LusterBowers recommendation. Will re-check.

## 2018-05-16 NOTE — Discharge Instructions (Signed)
Continue weight bear as tolerated on the right lower extremity.   ° °Elevate the right lower extremity whenever possible and continue the polar care while elevating the extremity. Patient may shower. No bath or submerging the wound.   ° °Take ECASA as directed for blood clot prevention. ° °Continue to work on knee range of motion exercises at home as instructed by physical therapy. Continue to use a walker for assistance with ambulation until cleared by physical therapy. ° °Call [phone] with any questions, such as fever > 101.5 degrees, drainage from the wound or shortness of breath. ° ° °

## 2018-05-16 NOTE — Care Management Note (Deleted)
Case Management Note  Patient Details  Name: Ashley [name] MRN: [id number] Date of Birth: 09-02-1951  Subjective/Objective:  Admitted to [facility] for Total Knee Replacement. Lives alone. Daughter is [name] [phone]). [facility] [place]. No home Health in the past. No skilled nursing in the past. Works full time in [place]. Takes care of all basic and instrument activities of daly living herself, drives when stable. No falls. Daughter will transport                Possible discharge today.  Action/Plan: Physical therapy evaluation completed. Recommending home with home health/physical therapy and rolling walker Discussed home health agencies. Would like [facility]. Will update [name] [facility] representative updated. Will request rolling walker from Brad at [facility]   Expected Discharge Date:  05/16/18               Expected Discharge Plan:     In-House Referral:   yes  Discharge planning Services   yes  Post Acute Care Choice:   yes Choice offered to:   patient  DME Arranged:   yes DME Agency:   [facility]  HH Arranged:   yes HH Agency:   [facility]  Status of Service:     If discussed at [employer] of [employer] Meetings, dates discussed:    Additional Comments:  Gwenette GreetBrenda S Mazzie Brodrick, RN MSN CCM Care Management [phone] 05/16/2018, 10:52 AM

## 2018-05-16 NOTE — Progress Notes (Signed)
  Subjective:  Patient reports pain as moderate.  Severe pain overnight.  Objective:   VITALS:   Vitals:   05/15/18 2011 05/16/18 0026 05/16/18 0028 05/16/18 0725  BP: (!) 146/85 128/83  119/74  Pulse:  69  78  Resp:  18  18  Temp:  (!) 101.1 F (38.4 C)  98.3 F (36.8 C)  TempSrc:  Oral  Oral  SpO2:  90% 95% 94%  Weight:      Height:        PHYSICAL EXAM:  Sensation intact distally Intact pulses distally Dorsiflexion/Plantar flexion intact Incision: moderate drainage Compartment soft  LABS  Results for orders placed or performed during the hospital encounter of 05/14/18 (from the past 24 hour(s))  CBC     Status: Abnormal   Collection Time: 05/16/18  5:17 AM  Result Value Ref Range   WBC 4.6 3.6 - 11.0 K/uL   RBC 2.92 (L) 3.80 - 5.20 MIL/uL   Hemoglobin 10.8 (L) 12.0 - 16.0 g/dL   HCT 16.132.2 (L) 09.635.0 - 04.547.0 %   MCV 110.2 (H) 80.0 - 100.0 fL   MCH 37.0 (H) 26.0 - 34.0 pg   MCHC 33.6 32.0 - 36.0 g/dL   RDW 40.914.5 81.111.5 - 91.414.5 %   Platelets 184 150 - 440 K/uL    Dg Knee Right Port  Result Date: 05/14/2018 CLINICAL DATA:  Right knee arthroplasty EXAM: PORTABLE RIGHT KNEE - 1-2 VIEW COMPARISON:  None. FINDINGS: Expected postop change status post right total knee arthroplasty with patellar resurfacing. Intra-articular emphysema and overlying anterior skin staples are noted. No immediate postoperative complications. Intact hardware without fracture. IMPRESSION: No immediate postoperative complications status post right total knee arthroplasty. Expected postop change of the soft tissues. Electronically Signed   By: Tollie Ethavid  Kwon M.D.   On: 05/14/2018 15:28    Assessment/Plan: 2 Days Post-Op   Active Problems:   Status post right knee replacement   Up with therapy Discharge home with home health possibly later today Pain control BM today Dressing changed Xanax at night as needed   Lyndle HerrlichJames R Ousmane Seeman , MD 05/16/2018, 9:10 AM

## 2018-05-16 NOTE — Discharge Summary (Addendum)
Physician Discharge Summary  Patient ID: Ashley [name] MRN: [id number] DOB/AGE: Aug 20, 1951 67 y.o.  Admit date: 05/14/2018 Discharge date: 05/17/2018  Admission Diagnoses:  osteoarthritis of right knee <principal problem not specified>  Discharge Diagnoses:  osteoarthritis of right knee Active Problems:   Status post right knee replacement   Past Medical History:  Diagnosis Date  . ADHD (attention deficit hyperactivity disorder) 05/02/2015  . Anemia   . Anxiety   . Arthritis   . Attention deficit disorder   . Autoimmune liver disease   . Barrett's esophagus   . COPD (chronic obstructive pulmonary disease) (HCC)   . Depression   . Gastritis   . GERD (gastroesophageal reflux disease)   . Hypertension   . Insomnia   . Internal hemorrhoids   . Liver failure (HCC)   . Migraine   . Ovarian failure   . Peptic ulcer disease   . Small bowel obstruction (HCC)     Surgeries: Procedure(s): TOTAL KNEE ARTHROPLASTY on 05/14/2018   Consultants (if any):   Discharged Condition: Improved  Hospital Course: Ashley [name] is an 67 y.o. female who was admitted 05/14/2018 with a diagnosis of  osteoarthritis of right knee <principal problem not specified> and went to the operating room on 05/14/2018 and underwent the above named procedures.  Patient had a fever on 05/16/2018 and work-up involved labs, chest x-ray and UA. These tests were negative. She is ready for discharge today.  She was given perioperative antibiotics:  Anti-infectives (From admission, onward)   Start     Dose/Rate Route Frequency Ordered Stop   05/14/18 1800  ceFAZolin (ANCEF) IVPB 1 g/50 mL premix     1 g 100 mL/hr over 30 Minutes Intravenous Every 6 hours 05/14/18 1617 05/15/18 0559   05/14/18 1420  50,000 units bacitracin in 0.9% normal saline 250 mL irrigation  Status:  Discontinued       As needed 05/14/18 1420 05/14/18 1446   05/14/18 1038  ceFAZolin (ANCEF) 2-4 GM/100ML-% IVPB    Note to Pharmacy:  Lorrene Reid   : cabinet override      05/14/18 1038 05/14/18 1249   05/14/18 0600  ceFAZolin (ANCEF) IVPB 2g/100 mL premix     2 g 200 mL/hr over 30 Minutes Intravenous On call to O.R. 05/14/18 0111 05/14/18 1249   05/14/18 0600  ceFAZolin (ANCEF) IVPB 2g/100 mL premix  Status:  Discontinued     2 g 200 mL/hr over 30 Minutes Intravenous On call to O.R. 05/14/18 0111 05/14/18 0112    .  She was given sequential compression devices, early ambulation, and ECASA for DVT prophylaxis.  She benefited maximally from the hospital stay and there were no complications.    Recent vital signs:  Vitals:   05/16/18 0028 05/16/18 0725  BP:  119/74  Pulse:  78  Resp:  18  Temp:  98.3 F (36.8 C)  SpO2: 95% 94%    Recent laboratory studies:  Lab Results  Component Value Date   HGB 10.8 (L) 05/16/2018   HGB 10.8 (L) 05/15/2018   HGB 13.0 05/07/2018   Lab Results  Component Value Date   WBC 4.6 05/16/2018   PLT 184 05/16/2018   Lab Results  Component Value Date   INR 0.90 05/07/2018   Lab Results  Component Value Date   NA 137 05/15/2018   K 4.5 05/15/2018   CL 106 05/15/2018   CO2 24 05/15/2018   BUN 28 (H) 05/15/2018   CREATININE 1.02 (  H) 05/15/2018   GLUCOSE 125 (H) 05/15/2018    Discharge Medications:   Allergies as of 05/16/2018      Reactions   Topamax [topiramate] Other (See Comments)   "shut my body down."   Tylenol [acetaminophen] Other (See Comments)   Elevated liver enzymes       Medication List    TAKE these medications   ALPRAZolam 1 MG tablet Commonly known as:  XANAX Take 1 tablet (1 mg total) by mouth at bedtime as needed for anxiety.   amLODipine 10 MG tablet Commonly known as:  NORVASC Take 1 tablet (10 mg total) by mouth daily.   aspirin 81 MG chewable tablet Chew 1 tablet (81 mg total) by mouth 2 (two) times daily.   atenolol 100 MG tablet Commonly known as:  TENORMIN TAKE 1 TABLET BY MOUTH TWICE DAILY   docusate sodium 100 MG  capsule Commonly known as:  COLACE Take 1 capsule (100 mg total) by mouth 2 (two) times daily.   lisdexamfetamine 30 MG capsule Commonly known as:  VYVANSE Take 1 capsule (30 mg total) by mouth daily. Start taking on:  05/17/2018   loratadine 10 MG tablet Commonly known as:  CLARITIN Take 10 mg by mouth daily.   losartan 50 MG tablet Commonly known as:  COZAAR TAKE ONE TABLET BY MOUTH EVERY DAY   omeprazole 20 MG capsule Commonly known as:  PRILOSEC Take 1 capsule (20 mg total) by mouth daily.   oxyCODONE 5 MG immediate release tablet Commonly known as:  Oxy IR/ROXICODONE Take 1 tablet (5 mg total) by mouth every 4 (four) hours as needed for moderate pain.   sulfamethoxazole-trimethoprim 800-160 MG tablet Commonly known as:  BACTRIM DS,SEPTRA DS Take 1 tablet by mouth 2 (two) times daily.   SUMAtriptan 100 MG tablet Commonly known as:  IMITREX Take 1 tablet (100 mg total) by mouth once for 1 dose. May repeat in 2 hours if headache persists or recurs. What changed:    when to take this  reasons to take this  additional instructions   SUMAtriptan 20 MG/ACT nasal spray Commonly known as:  IMITREX Place 1 spray (20 mg total) into the nose once for 1 dose. May repeat in 2 hours if headache persists or recurs. What changed:    when to take this  reasons to take this  additional instructions   traZODone 50 MG tablet Commonly known as:  DESYREL Take 0.5-1 tablets (25-50 mg total) by mouth at bedtime as needed for sleep.   VITAMIN B 12 PO Take 1,000 mcg by mouth daily.   Vitamin D (Ergocalciferol) 50000 units Caps capsule Commonly known as:  DRISDOL Take 50,000 Units by mouth every 7 (seven) days.   Vitamin D3 5000 units Caps Take 1 capsule by mouth daily.       Diagnostic Studies: Dg Knee Right Port  Result Date: 05/14/2018 CLINICAL DATA:  Right knee arthroplasty EXAM: PORTABLE RIGHT KNEE - 1-2 VIEW COMPARISON:  None. FINDINGS: Expected postop change  status post right total knee arthroplasty with patellar resurfacing. Intra-articular emphysema and overlying anterior skin staples are noted. No immediate postoperative complications. Intact hardware without fracture. IMPRESSION: No immediate postoperative complications status post right total knee arthroplasty. Expected postop change of the soft tissues. Electronically Signed   By: Tollie Ethavid  Kwon M.D.   On: 05/14/2018 15:28    Disposition:        Signed: Lyndle HerrlichJames R Trenace Coughlin ,MD 05/16/2018, 1:55 PM

## 2018-05-16 NOTE — Care Management Note (Signed)
Case Management Note  Patient Details  Name: Ashley RoanDebra M Bencivenga MRN: [id number] Date of Birth: 10-26-1951  Subjective/Objective:     Admitted to [facility] for a Total Knee replacement. Daughter is in the home. Sister is Pattie [phone]).  Sees Dr. Sherie DonLada as primary care physician. Uses [facility] for prescriptions. No home Health in the past. No skilled nursing.  Bedside commode in the home.                Action/Plan: Physical therapy evaluation completed. Recommending home with home health and physical therapy. Chose [facility].   Expected Discharge Date:  05/16/18               Expected Discharge Plan:     In-House Referral:   yes  Discharge planning Services   yes  Post Acute Care Choice:   yes Choice offered to:   patient  DME Arranged:   yes DME Agency:   [facility]  HH Arranged:    HH Agency:   [facility]  Status of Service:     If discussed at [employer] Length of [employer] Meetings, dates discussed:    Additional Comments:  Gwenette GreetBrenda S Ree Alcalde, RN MSN CCM Care Management [phone] 05/16/2018, 11:40 AM

## 2018-05-16 NOTE — Progress Notes (Signed)
Physical Therapy Treatment Patient Details Name: Ashley RoanDebra M [name] MRN: [id number] DOB: 08/11/1951 Today's Date: 05/16/2018    History of Present Illness Patient is 67 yo female s/p R TKA 05/14/18, WBAT with PMH of ADHD, COPD, HTN    PT Comments    Patient up in bed at start of session with care manager and nursing staff at bedside. Patient agreeable to therapy, R knee pain 6/10. Patient able to complete therapeutic exercises with minimal help due to increased pain this session. Patient still needs verbal/visual cues to maximize independence and safety for sit <> stand tranfers. Ambulated ~3075ft with RW and CGA. Patient up in chair at end of session with all needs in reach, nursing at bedside to administer pain medications.     Follow Up Recommendations  Home health PT     Equipment Recommendations  Rolling walker with 5" wheels    Recommendations for Other Services       Precautions / Restrictions Precautions Precautions: Knee Restrictions Weight Bearing Restrictions: Yes RLE Weight Bearing: Weight bearing as tolerated    Mobility  Bed Mobility Overal bed mobility: Needs Assistance Bed Mobility: Supine to Sit     Supine to sit: Min assist     General bed mobility comments: Patient needed min Ax1 for RLE due to increased pain with mobility  Transfers Overall transfer level: Needs assistance Equipment used: Rolling walker (2 wheeled) Transfers: Sit to/from Stand Sit to Stand: Min assist         General transfer comment: Patient needed minAx1 from lower bed surface  Ambulation/Gait Ambulation/Gait assistance: Min guard Gait Distance (Feet): 75 Feet Assistive device: Rolling walker (2 wheeled) Gait Pattern/deviations: Step-through pattern     General Gait Details: Patient still demonstrates slight trembling with ambulation, states her pain is much higher than last session.    Stairs             Wheelchair Mobility    Modified Rankin (Stroke Patients  Only)       Balance                                            Cognition                                              Exercises Total Joint Exercises Ankle Circles/Pumps: AROM;Strengthening;Both;20 reps Quad Sets: AROM;Strengthening;Both;20 reps Short Arc Quad: AROM;Right;20 reps Hip ABduction/ADduction: AROM;Right;20 reps Straight Leg Raises: AROM;AAROM;Right;20 reps Long Arc Quad: AROM;10 reps;AAROM;Strengthening;Right Goniometric ROM: R knee extension: 6deg from neutral, R knee flexion: 88deg    General Comments        Pertinent Vitals/Pain Pain Assessment: 0-10 Pain Score: 6  Pain Descriptors / Indicators: Sore Pain Intervention(s): Repositioned;Monitored during session;Patient requesting pain meds-RN notified    Home Living                      Prior Function            PT Goals (current goals can now be found in the care plan section) Acute Rehab PT Goals Patient Stated Goal: Patient wants to get home and wants to return to PLOF PT Goal Formulation: With patient Time For Goal Achievement: 05/29/18 Potential to Achieve Goals: Good Progress towards PT  goals: Progressing toward goals    Frequency    BID      PT Plan Current plan remains appropriate    Co-evaluation              AM-PAC PT "6 Clicks" Daily Activity  Outcome Measure  Difficulty turning over in bed (including adjusting bedclothes, sheets and blankets)?: None Difficulty moving from lying on back to sitting on the side of the bed? : A Little Difficulty sitting down on and standing up from a chair with arms (e.g., wheelchair, bedside commode, etc,.)?: A Little Help needed moving to and from a bed to chair (including a wheelchair)?: A Little Help needed walking in hospital room?: A Little Help needed climbing 3-5 steps with a railing? : A Little 6 Click Score: 19    End of Session Equipment Utilized During Treatment: Gait  belt Activity Tolerance: Patient limited by pain Patient left: in chair;with chair alarm set;with SCD's reapplied;with call bell/phone within reach;with nursing/sitter in room Nurse Communication: Mobility status PT Visit Diagnosis: Unsteadiness on feet (R26.81);Other abnormalities of gait and mobility (R26.89);History of falling (Z91.81);Muscle weakness (generalized) (M62.81)     Time: 1133-1209 PT Time Calculation (min) (ACUTE ONLY): 36 min  Charges:  $Therapeutic Exercise: 8-22 mins $Therapeutic Activity: 8-22 mins                    G Codes:      Olga Coaster PT, DPT 1:15 PM,05/16/18 [phone]

## 2018-05-17 LAB — CBC
HCT: 27 % — ABNORMAL LOW (ref 35.0–47.0)
Hemoglobin: 9.2 g/dL — ABNORMAL LOW (ref 12.0–16.0)
MCH: 37.8 pg — ABNORMAL HIGH (ref 26.0–34.0)
MCHC: 34 g/dL (ref 32.0–36.0)
MCV: 111.1 fL — ABNORMAL HIGH (ref 80.0–100.0)
PLATELETS: 165 10*3/uL (ref 150–440)
RBC: 2.43 MIL/uL — AB (ref 3.80–5.20)
RDW: 14.1 % (ref 11.5–14.5)
WBC: 5.7 10*3/uL (ref 3.6–11.0)

## 2018-05-17 NOTE — Progress Notes (Signed)
Patient d/c home via family. Instructions given to patient, verbalized understanding. Prescriptions were given to patient yesterday and this nurse confirmed with patient that prescriptions were received. IV removed. Transported home via family.

## 2018-05-17 NOTE — Progress Notes (Signed)
Physical Therapy Treatment Patient Details Name: Ashley RoanDebra M [name] MRN: [id number] DOB: 05-Jul-1951 Today's Date: 05/17/2018    History of Present Illness Patient is 67 yo female s/p R TKA 05/14/18, WBAT with PMH of ADHD, COPD, HTN    PT Comments    Participated in exercises as described below.  Pt was able to ambulate around nursing unit and up./down 4 steps with left rail and min guard.  Overall did well today with less pain.  Gait pattern remains irregular with varied step length and height/pattern.  She does need verbal cues to stand fully but had no LOB or buckling.  Reports she feels ready for discharge.   Follow Up Recommendations  Home health PT     Equipment Recommendations  Rolling walker with 5" wheels    Recommendations for Other Services       Precautions / Restrictions Precautions Precautions: Knee Precaution Booklet Issued: Yes (comment) Restrictions Weight Bearing Restrictions: Yes RLE Weight Bearing: Weight bearing as tolerated    Mobility  Bed Mobility Overal bed mobility: Needs Assistance Bed Mobility: Supine to Sit     Supine to sit: Min assist     General bed mobility comments: used rail  Transfers Overall transfer level: Needs assistance Equipment used: Rolling walker (2 wheeled) Transfers: Sit to/from Stand Sit to Stand: Min assist            Ambulation/Gait Ambulation/Gait assistance: Min guard Gait Distance (Feet): 200 Feet Assistive device: Rolling walker (2 wheeled) Gait Pattern/deviations: Step-to pattern;Step-through pattern;Trunk flexed     General Gait Details: irregular pattern, verbal cues to stand up straighter.   Stairs   Stairs assistance: Min guard Stair Management: One rail Left Number of Stairs: 4     Wheelchair Mobility    Modified Rankin (Stroke Patients Only)       Balance Overall balance assessment: Needs assistance Sitting-balance support: Feet supported Sitting balance-Leahy Scale: Good      Standing balance support: Bilateral upper extremity supported Standing balance-Leahy Scale: Fair Standing balance comment: relies on walker for support                            Cognition Arousal/Alertness: Awake/alert Behavior During Therapy: WFL for tasks assessed/performed Overall Cognitive Status: Within Functional Limits for tasks assessed                                        Exercises Total Joint Exercises Ankle Circles/Pumps: AROM;Strengthening;Both;20 reps Quad Sets: AROM;Strengthening;Both;10 reps Short Arc Quad: AROM;Right;20 reps Heel Slides: AROM;Strengthening;Right;10 reps Hip ABduction/ADduction: AROM;Right;10 reps Straight Leg Raises: AROM;Right;10 reps Long Arc Quad: AROM;10 reps;Right Knee Flexion: 5 reps;Right;AAROM Goniometric ROM: 2-100    General Comments        Pertinent Vitals/Pain Pain Assessment: 0-10 Pain Score: 4  Pain Location: with movement Pain Descriptors / Indicators: Sore Pain Intervention(s): Limited activity within patient's tolerance;Monitored during session;Premedicated before session;Ice applied    Home Living                      Prior Function            PT Goals (current goals can now be found in the care plan section) Progress towards PT goals: Progressing toward goals    Frequency    BID      PT Plan Current plan remains appropriate  Co-evaluation              AM-PAC PT "6 Clicks" Daily Activity  Outcome Measure  Difficulty turning over in bed (including adjusting bedclothes, sheets and blankets)?: None Difficulty moving from lying on back to sitting on the side of the bed? : A Little Difficulty sitting down on and standing up from a chair with arms (e.g., wheelchair, bedside commode, etc,.)?: A Little Help needed moving to and from a bed to chair (including a wheelchair)?: A Little Help needed walking in hospital room?: A Little Help needed climbing 3-5 steps  with a railing? : A Little 6 Click Score: 19    End of Session Equipment Utilized During Treatment: Gait belt Activity Tolerance: Patient tolerated treatment well Patient left: in chair;with chair alarm set;with call bell/phone within reach Nurse Communication: Mobility status       Time: 0926-0958 PT Time Calculation (min) (ACUTE ONLY): 32 min  Charges:  $Gait Training: 8-22 mins $Therapeutic Exercise: 8-22 mins                    G Codes:       Danielle Dess, PTA 05/17/18, 10:06 AM

## 2018-05-17 NOTE — Care Management Note (Signed)
Case Management Note  Patient Details  Name: Ashley [name] MRN: [id number] Date of Birth: 1951-07-14  Subjective/Objective:   Patient to be discharged per MD order. Orders in place for home health services. [facility] and is still in agreement with this plan. Referral placed with Rosey Batheresa from [facility] who accepts the patient. There is already a walker and bedside commode in the home so there are no DME needs. Daughter to provide transportation.                  Action/Plan:   Expected Discharge Date:  05/16/18               Expected Discharge Plan:     In-House Referral:     Discharge planning Services  CM Consult  Post Acute Care Choice:  Home Health Choice offered to:  Patient  DME Arranged:    DME Agency:     HH Arranged:  PT HH Agency:  [facility]  Status of Service:  Completed, signed off  If discussed at [employer] Meetings, dates discussed:    Additional Comments:  Virgel ManifoldJosh A Brinley Rosete, RN 05/17/2018, 8:37 AM

## 2018-05-19 MED ORDER — PROPOFOL 500 MG/50ML IV EMUL
INTRAVENOUS | Status: AC
  Filled 2018-05-19: qty 50

## 2018-05-19 MED ORDER — PROPOFOL 10 MG/ML IV BOLUS
INTRAVENOUS | Status: AC
  Filled 2018-05-19: qty 20

## 2018-05-20 ENCOUNTER — Telehealth: Payer: Self-pay | Admitting: Family Medicine

## 2018-05-20 ENCOUNTER — Telehealth: Payer: Self-pay

## 2018-05-20 NOTE — Telephone Encounter (Signed)
She needs to go to the ER NOW She may be having reaction to pain medicine, infection, other serious problem

## 2018-05-20 NOTE — Telephone Encounter (Signed)
Received and email from the Office of Patient experience stating that Ashley [name], person that is helping with the care of patient Ashley [name] at this time, called stating that the patient is very confused, incoherent, cant take care of herself and cannot get around since having total knee replacement surgery.  Would like to see if she, Ashley KidneyDebra, can get home health to come out to help her during the day.  Please advise.

## 2018-05-20 NOTE — Telephone Encounter (Signed)
I spoke with patient personally on the phone after she told the CMA that she didn't think she needed to go to the ER Patient sounded lucid; speech was not slurred; she did not sound incoherent; she vows that she has not had any alcohol to drink since before her surgery; she is aware of the danger of drinking alcohol and taking pain medicine, and says that would be stupid; she verbalizes understanding that mixing alcohol and pain pills could be fatal and cause accident overdose She will talk to her surgeon today about getting home health, as she was led to believe that they were going to set that up before she left the hospital ------------------------------------- [facility]) and verbalized that patient might have a UTI; at that point, I advised staff to make her an appt today to be seen after her surgery appointment

## 2018-05-20 NOTE — Telephone Encounter (Signed)
Dr. lada spoke with Patient and will have her f/u up surgeon

## 2018-05-20 NOTE — Telephone Encounter (Signed)
EMMI Follow-up: Noted on the report that patient wasn't sure if she received discharge paperwork.  Left a message for Ms. Fornes to call me at her convenience.  Will try calling again.

## 2018-05-21 ENCOUNTER — Telehealth: Payer: Self-pay | Admitting: Family Medicine

## 2018-05-21 NOTE — Telephone Encounter (Signed)
Copied from CRM [id number]. Topic: Quick Communication - See Telephone Encounter >> May 21, 2018  8:55 AM Oneal GroutSebastian, Jennifer S wrote: CRM for notification. See Telephone encounter for: 05/21/18. Had total knee replacement last week, caregiver concerned. Home Health is suppose to come out for PT today. They believe she needs a wellness eval with home health. Please advise

## 2018-05-21 NOTE — Telephone Encounter (Signed)
Family members keep calling in concerned about pt being confused possibly due to pain medication, unable to get around house and unable to get out to go to PT.  Pt's family states she is homebound and needs help.  I told Inetta Fermoina that pt really needed to be evaluated here, for Dr. lada to be able to help her. They also needed to contact surgeon since he did surgery and this is why she is having so much problems.  Inetta Fermoina states she has contacted Careers advisersurgeon with no response.  I told her to try again, and he could order what was necessary for pt as far as home health and PT and review her meds.   Spoke with Dr. Sherie DonLada and placed call to Surgeon Dr. Odis LusterBowers Left message stating family members are very concerned for this pt due to being confused with meds and also getting out to go to PT and unable to do anything for herself at home.  We recommended them to possibly get PT/Home health in or placing her in rehab facility.

## 2018-05-23 ENCOUNTER — Telehealth: Payer: Self-pay | Admitting: Licensed Clinical Social Worker

## 2018-05-23 NOTE — Telephone Encounter (Signed)
EMMI flagged patient for answering yes to feeling sad/hopeless/anxious/empty. Clinical Child psychotherapistocial Worker (CSW) contacted patient via telephone and was able to reach her. Per patient she is doing a lot better and worked with home health PT today and yesterday. Per patient she is going to see Dr. Odis LusterBowers on 05/27/18. Patient reported that she is not feeling sad or anxious. Patient reported that she did not mean to answer yes to that question. Patient has no needs or concerns.   Baker Hughes IncorporatedBailey Barnard Sharps, LCSW [phone]

## 2018-06-08 ENCOUNTER — Other Ambulatory Visit: Payer: Self-pay

## 2018-06-08 ENCOUNTER — Emergency Department: Payer: 59

## 2018-06-08 ENCOUNTER — Observation Stay
Admission: EM | Admit: 2018-06-08 | Discharge: 2018-06-10 | Disposition: A | Discharge status: Home Health | Payer: 59 | Attending: Internal Medicine | Admitting: Internal Medicine

## 2018-06-08 ENCOUNTER — Observation Stay: Payer: 59

## 2018-06-08 DIAGNOSIS — J449 Chronic obstructive pulmonary disease, unspecified: Secondary | ICD-10-CM | POA: Insufficient documentation

## 2018-06-08 DIAGNOSIS — G4700 Insomnia, unspecified: Secondary | ICD-10-CM | POA: Insufficient documentation

## 2018-06-08 DIAGNOSIS — Z87891 Personal history of nicotine dependence: Secondary | ICD-10-CM | POA: Insufficient documentation

## 2018-06-08 DIAGNOSIS — F329 Major depressive disorder, single episode, unspecified: Secondary | ICD-10-CM | POA: Insufficient documentation

## 2018-06-08 DIAGNOSIS — E2839 Other primary ovarian failure: Secondary | ICD-10-CM | POA: Diagnosis not present

## 2018-06-08 DIAGNOSIS — Z79899 Other long term (current) drug therapy: Secondary | ICD-10-CM | POA: Diagnosis not present

## 2018-06-08 DIAGNOSIS — R42 Dizziness and giddiness: Secondary | ICD-10-CM

## 2018-06-08 DIAGNOSIS — I700 Atherosclerosis of aorta: Secondary | ICD-10-CM | POA: Insufficient documentation

## 2018-06-08 DIAGNOSIS — F419 Anxiety disorder, unspecified: Secondary | ICD-10-CM | POA: Diagnosis not present

## 2018-06-08 DIAGNOSIS — K219 Gastro-esophageal reflux disease without esophagitis: Secondary | ICD-10-CM | POA: Diagnosis not present

## 2018-06-08 DIAGNOSIS — Z7982 Long term (current) use of aspirin: Secondary | ICD-10-CM | POA: Diagnosis not present

## 2018-06-08 DIAGNOSIS — F909 Attention-deficit hyperactivity disorder, unspecified type: Secondary | ICD-10-CM | POA: Diagnosis not present

## 2018-06-08 DIAGNOSIS — E860 Dehydration: Secondary | ICD-10-CM | POA: Insufficient documentation

## 2018-06-08 DIAGNOSIS — D649 Anemia, unspecified: Secondary | ICD-10-CM | POA: Diagnosis not present

## 2018-06-08 DIAGNOSIS — R197 Diarrhea, unspecified: Secondary | ICD-10-CM

## 2018-06-08 DIAGNOSIS — I10 Essential (primary) hypertension: Secondary | ICD-10-CM | POA: Insufficient documentation

## 2018-06-08 DIAGNOSIS — R2681 Unsteadiness on feet: Secondary | ICD-10-CM | POA: Insufficient documentation

## 2018-06-08 DIAGNOSIS — Z886 Allergy status to analgesic agent status: Secondary | ICD-10-CM | POA: Insufficient documentation

## 2018-06-08 DIAGNOSIS — B9620 Unspecified Escherichia coli [E. coli] as the cause of diseases classified elsewhere: Secondary | ICD-10-CM | POA: Insufficient documentation

## 2018-06-08 DIAGNOSIS — Z8249 Family history of ischemic heart disease and other diseases of the circulatory system: Secondary | ICD-10-CM | POA: Diagnosis not present

## 2018-06-08 DIAGNOSIS — Z800 Family history of malignant neoplasm of digestive organs: Secondary | ICD-10-CM | POA: Insufficient documentation

## 2018-06-08 DIAGNOSIS — N390 Urinary tract infection, site not specified: Secondary | ICD-10-CM | POA: Diagnosis not present

## 2018-06-08 DIAGNOSIS — R112 Nausea with vomiting, unspecified: Secondary | ICD-10-CM

## 2018-06-08 LAB — URINALYSIS, COMPLETE (UACMP) WITH MICROSCOPIC
Bilirubin Urine: NEGATIVE
GLUCOSE, UA: NEGATIVE mg/dL
Hgb urine dipstick: NEGATIVE
KETONES UR: 20 mg/dL — AB
Nitrite: NEGATIVE
PROTEIN: NEGATIVE mg/dL
Specific Gravity, Urine: 1.016 (ref 1.005–1.030)
pH: 5 (ref 5.0–8.0)

## 2018-06-08 LAB — GASTROINTESTINAL PANEL BY PCR, STOOL (REPLACES STOOL CULTURE)

## 2018-06-08 LAB — COMPREHENSIVE METABOLIC PANEL
ALK PHOS: 50 U/L (ref 38–126)
ALT: 17 U/L (ref 0–44)
AST: 32 U/L (ref 15–41)
Albumin: 3 g/dL — ABNORMAL LOW (ref 3.5–5.0)
Anion gap: 13 (ref 5–15)
BUN: 14 mg/dL (ref 8–23)
CO2: 18 mmol/L — ABNORMAL LOW (ref 22–32)
CREATININE: 0.67 mg/dL (ref 0.44–1.00)
Calcium: 8 mg/dL — ABNORMAL LOW (ref 8.9–10.3)
Chloride: 111 mmol/L (ref 98–111)
GFR calc Af Amer: >60 mL/min (ref >60)
GFR calc non Af Amer: >60 mL/min (ref >60)
Glucose, Bld: 106 mg/dL — ABNORMAL HIGH (ref 70–99)
Potassium: 4.5 mmol/L (ref 3.5–5.1)
Sodium: 142 mmol/L (ref 135–145)
Total Bilirubin: 1.1 mg/dL (ref 0.3–1.2)
Total Protein: 6 g/dL — ABNORMAL LOW (ref 6.5–8.1)

## 2018-06-08 LAB — CBC
HCT: 35.5 % (ref 35.0–47.0)
HEMOGLOBIN: 11.6 g/dL — AB (ref 12.0–16.0)
MCH: 33.9 pg (ref 26.0–34.0)
MCHC: 32.7 g/dL (ref 32.0–36.0)
MCV: 103.8 fL — ABNORMAL HIGH (ref 80.0–100.0)
Platelets: 429 10*3/uL (ref 150–440)
RBC: 3.42 MIL/uL — ABNORMAL LOW (ref 3.80–5.20)
RDW: 16.4 % — ABNORMAL HIGH (ref 11.5–14.5)
WBC: 8.4 10*3/uL (ref 3.6–11.0)

## 2018-06-08 LAB — C DIFFICILE QUICK SCREEN W PCR REFLEX
C Diff antigen: NEGATIVE
C Diff interpretation: NOT DETECTED
C Diff toxin: NEGATIVE

## 2018-06-08 LAB — LIPASE, BLOOD: LIPASE: 41 U/L (ref 11–51)

## 2018-06-08 MED ORDER — ALPRAZOLAM 1 MG PO TABS
1.0000 mg | ORAL_TABLET | Freq: Every evening | ORAL | Status: DC | PRN
Start: 2018-06-08 — End: 2018-06-10
  Administered 2018-06-08 – 2018-06-09 (×2): 1 mg via ORAL
  Filled 2018-06-08 (×2): qty 1

## 2018-06-08 MED ORDER — ATENOLOL 25 MG PO TABS
100.0000 mg | ORAL_TABLET | Freq: Two times a day (BID) | ORAL | Status: DC
  Administered 2018-06-08 – 2018-06-10 (×4): 100 mg via ORAL
  Filled 2018-06-08 (×4): qty 4

## 2018-06-08 MED ORDER — VITAMIN D3 25 MCG (1000 UNIT) PO TABS
5000.0000 [IU] | ORAL_TABLET | Freq: Every day | ORAL | Status: DC
  Administered 2018-06-09 – 2018-06-10 (×2): 5000 [IU] via ORAL
  Filled 2018-06-08 (×4): qty 5

## 2018-06-08 MED ORDER — MECLIZINE HCL 25 MG PO TABS
12.5000 mg | ORAL_TABLET | Freq: Three times a day (TID) | ORAL | Status: DC
  Administered 2018-06-08 – 2018-06-10 (×5): 12.5 mg via ORAL
  Filled 2018-06-08 (×8): qty 0.5

## 2018-06-08 MED ORDER — LISDEXAMFETAMINE DIMESYLATE 30 MG PO CAPS
30.0000 mg | ORAL_CAPSULE | Freq: Every day | ORAL | Status: DC
  Administered 2018-06-09 – 2018-06-10 (×2): 30 mg via ORAL
  Filled 2018-06-08 (×2): qty 1

## 2018-06-08 MED ORDER — LOSARTAN POTASSIUM 50 MG PO TABS
50.0000 mg | ORAL_TABLET | Freq: Every day | ORAL | Status: DC
  Administered 2018-06-09 – 2018-06-10 (×2): 50 mg via ORAL
  Filled 2018-06-08 (×2): qty 1

## 2018-06-08 MED ORDER — IOHEXOL 300 MG/ML  SOLN
100.0000 mL | Freq: Once | INTRAMUSCULAR | Status: AC | PRN
  Administered 2018-06-08: 100 mL via INTRAVENOUS

## 2018-06-08 MED ORDER — LORAZEPAM 2 MG/ML IJ SOLN
0.5000 mg | Freq: Once | INTRAMUSCULAR | Status: AC
  Administered 2018-06-08: 0.5 mg via INTRAVENOUS
  Filled 2018-06-08: qty 1

## 2018-06-08 MED ORDER — HEPARIN SODIUM (PORCINE) 5000 UNIT/ML IJ SOLN
5000.0000 [IU] | Freq: Three times a day (TID) | INTRAMUSCULAR | Status: DC
  Administered 2018-06-08 – 2018-06-10 (×5): 5000 [IU] via SUBCUTANEOUS
  Filled 2018-06-08 (×5): qty 1

## 2018-06-08 MED ORDER — CEPHALEXIN 500 MG PO CAPS: 500.0000 mg | ORAL_CAPSULE | Freq: Two times a day (BID) | ORAL | 0 refills | Status: DC

## 2018-06-08 MED ORDER — SODIUM CHLORIDE 0.9 % IV BOLUS
1000.0000 mL | Freq: Once | INTRAVENOUS | Status: AC
  Administered 2018-06-08: 1000 mL via INTRAVENOUS

## 2018-06-08 MED ORDER — PANTOPRAZOLE SODIUM 40 MG PO TBEC
40.0000 mg | DELAYED_RELEASE_TABLET | Freq: Every day | ORAL | Status: DC
  Administered 2018-06-08 – 2018-06-10 (×3): 40 mg via ORAL
  Filled 2018-06-08 (×3): qty 1

## 2018-06-08 MED ORDER — MECLIZINE HCL 25 MG PO TABS
12.5000 mg | ORAL_TABLET | Freq: Once | ORAL | Status: AC
  Administered 2018-06-08: 12.5 mg via ORAL
  Filled 2018-06-08: qty 1

## 2018-06-08 MED ORDER — DOCUSATE SODIUM 100 MG PO CAPS
100.0000 mg | ORAL_CAPSULE | Freq: Two times a day (BID) | ORAL | Status: DC | PRN
  Administered 2018-06-08: 100 mg via ORAL
  Filled 2018-06-08: qty 1

## 2018-06-08 MED ORDER — IOPAMIDOL (ISOVUE-300) INJECTION 61%
30.0000 mL | Freq: Once | INTRAVENOUS | Status: AC
  Administered 2018-06-08: 30 mL via ORAL

## 2018-06-08 MED ORDER — DOCUSATE SODIUM 100 MG PO CAPS
100.0000 mg | ORAL_CAPSULE | Freq: Two times a day (BID) | ORAL | Status: DC
Start: 2018-06-08 — End: 2018-06-08

## 2018-06-08 MED ORDER — SODIUM CHLORIDE 0.9 % IV SOLN
1.0000 g | INTRAVENOUS | Status: DC
  Administered 2018-06-09: 1 g via INTRAVENOUS
  Filled 2018-06-08: qty 10
  Filled 2018-06-08: qty 1

## 2018-06-08 MED ORDER — SODIUM CHLORIDE 0.9 % IV SOLN
1.0000 g | Freq: Once | INTRAVENOUS | Status: AC
  Administered 2018-06-08: 1 g via INTRAVENOUS
  Filled 2018-06-08: qty 10

## 2018-06-08 MED ORDER — ASPIRIN 81 MG PO CHEW
81.0000 mg | CHEWABLE_TABLET | Freq: Every day | ORAL | Status: DC
  Administered 2018-06-09 – 2018-06-10 (×2): 81 mg via ORAL
  Filled 2018-06-08 (×2): qty 1

## 2018-06-08 MED ORDER — VITAMIN B-12 1000 MCG PO TABS
3000.0000 ug | ORAL_TABLET | Freq: Every day | ORAL | Status: DC
  Administered 2018-06-09 – 2018-06-10 (×2): 3000 ug via ORAL
  Filled 2018-06-08 (×2): qty 3

## 2018-06-08 MED ORDER — ONDANSETRON 4 MG PO TBDP: 4.0000 mg | ORAL_TABLET | Freq: Four times a day (QID) | ORAL | 0 refills | Status: DC | PRN

## 2018-06-08 NOTE — H&P (Signed)
[facility] [place] [facility]   PATIENT NAME: Ashley [name]    MR#:  [id number]  DATE OF BIRTH:  Jun 07, 1951  DATE OF ADMISSION:  06/08/2018  PRIMARY CARE PHYSICIAN: Kerman Passey, MD   REQUESTING/REFERRING PHYSICIAN: Schaevitz  CHIEF COMPLAINT:   Chief Complaint  Patient presents with  . Emesis  . Diarrhea    HISTORY OF PRESENT ILLNESS: [place] Nishi  is a 67 y.o. female with a known history of attention deficit hyperactivity disorder, anemia, anxiety, arthritis, COPD, depression, gastroesophageal reflux disease, hypertension, migraine, peptic ulcer disease-had right knee replacement surgery 3 weeks ago but recovering from that fine and able to move around now.  Was also treated for UTI at that time. Today morning when she woke up, felt significantly dizzy on any attempt to try to move her head from side-to-side or getting up.  Concerned with this she came to emergency room.  Noted to have UTI.  Given IV antibiotic dosing for IV fluids.  But still she could not get up and walk much because of significant dizziness, so ER physician suggested to admit to hospitalist team.  CT scan of the head was negative in ER.  PAST MEDICAL HISTORY:   Past Medical History:  Diagnosis Date  . ADHD (attention deficit hyperactivity disorder) 05/02/2015  . Anemia   . Anxiety   . Arthritis   . Attention deficit disorder   . Autoimmune liver disease   . Barrett's esophagus   . COPD (chronic obstructive pulmonary disease) (HCC)   . Depression   . Gastritis   . GERD (gastroesophageal reflux disease)   . Hypertension   . Insomnia   . Internal hemorrhoids   . Liver failure (HCC)   . Migraine   . Ovarian failure   . Peptic ulcer disease   . Small bowel obstruction (HCC)     PAST SURGICAL HISTORY:  Past Surgical History:  Procedure Laterality Date  . ABDOMINAL HYSTERECTOMY    . DILATION AND CURETTAGE OF UTERUS    . LIVER BIOPSY    . OOPHORECTOMY    . TOTAL KNEE  ARTHROPLASTY Right 05/14/2018   Procedure: TOTAL KNEE ARTHROPLASTY;  Surgeon: Lyndle Herrlich, MD;  Location: [facility] ORS;  Service: Orthopedics;  Laterality: Right;    SOCIAL HISTORY:  Social History   Tobacco Use  . Smoking status: Former Smoker    Last attempt to quit: 11/05/1988    Years since quitting: 29.6  . Smokeless tobacco: Never Used  Substance Use Topics  . Alcohol use: Yes    Alcohol/week: 4.2 oz    Types: 7 Glasses of wine per week    FAMILY HISTORY:  Family History  Problem Relation Age of Onset  . Heart failure Father   . Atrial fibrillation Mother   . Cerebral palsy Brother   . Heart attack Maternal Grandfather   . CAD Paternal Grandmother   . Colon cancer Neg Hx     DRUG ALLERGIES:  Allergies  Allergen Reactions  . Topamax [Topiramate] Other (See Comments)    "shut my body down."  . Tylenol [Acetaminophen] Other (See Comments)    Elevated liver enzymes     REVIEW OF SYSTEMS:   CONSTITUTIONAL: No fever, fatigue or weakness.  EYES: No blurred or double vision.  EARS, NOSE, AND THROAT: No tinnitus or ear pain.  RESPIRATORY: No cough, shortness of breath, wheezing or hemoptysis.  CARDIOVASCULAR: No chest pain, orthopnea, edema.  GASTROINTESTINAL: No nausea, vomiting, diarrhea or abdominal pain.  GENITOURINARY: No dysuria, hematuria.  ENDOCRINE: No polyuria, nocturia,  HEMATOLOGY: No anemia, easy bruising or bleeding SKIN: No rash or lesion. MUSCULOSKELETAL: No joint pain or arthritis.   NEUROLOGIC: No tingling, numbness, weakness.  PSYCHIATRY: No anxiety or depression.   MEDICATIONS AT HOME:  Prior to Admission medications   Medication Sig Start Date End Date Taking? Authorizing Provider  ALPRAZolam Prudy Feeler) 1 MG tablet Take 1 tablet (1 mg total) by mouth at bedtime as needed for anxiety. 05/16/18  Yes Lyndle Herrlich, MD  aspirin 81 MG chewable tablet Chew 1 tablet (81 mg total) by mouth 2 (two) times daily. Patient taking differently: Chew 81 mg by  mouth daily.  05/16/18  Yes Lyndle Herrlich, MD  atenolol (TENORMIN) 100 MG tablet TAKE 1 TABLET BY MOUTH TWICE DAILY 09/30/17  Yes Lada, Janit Bern, MD  Cholecalciferol (VITAMIN D3) 5000 units CAPS Take 1 capsule by mouth daily.   Yes [provider]  Cyanocobalamin (VITAMIN B 12 PO) Take 3,000 mcg by mouth daily.    Yes [provider]  docusate sodium (COLACE) 100 MG capsule Take 1 capsule (100 mg total) by mouth 2 (two) times daily. 05/16/18  Yes Lyndle Herrlich, MD  lisdexamfetamine (VYVANSE) 30 MG capsule Take 1 capsule (30 mg total) by mouth daily. 05/17/18  Yes Lyndle Herrlich, MD  loratadine (CLARITIN) 10 MG tablet Take 10 mg by mouth daily as needed for allergies.    Yes [provider]  losartan (COZAAR) 50 MG tablet TAKE ONE TABLET BY MOUTH EVERY DAY 02/11/18  Yes Lada, Janit Bern, MD  omeprazole (PRILOSEC) 20 MG capsule Take 1 capsule (20 mg total) by mouth daily. 05/12/18  Yes Lada, Janit Bern, MD  amLODipine (NORVASC) 10 MG tablet Take 1 tablet (10 mg total) by mouth daily. Patient not taking: Reported on 06/08/2018 05/09/18   Cheryle Horsfall, NP  cephALEXin (KEFLEX) 500 MG capsule Take 1 capsule (500 mg total) by mouth 2 (two) times daily for 7 days. 06/08/18 06/15/18  Sharyn Creamer, MD  ondansetron (ZOFRAN ODT) 4 MG disintegrating tablet Take 1 tablet (4 mg total) by mouth every 6 (six) hours as needed for nausea or vomiting. 06/08/18   Sharyn Creamer, MD  oxyCODONE (OXY IR/ROXICODONE) 5 MG immediate release tablet Take 1 tablet (5 mg total) by mouth every 4 (four) hours as needed for moderate pain. Patient not taking: Reported on 06/08/2018 05/16/18   Lyndle Herrlich, MD  SUMAtriptan (IMITREX) 100 MG tablet Take 1 tablet (100 mg total) by mouth once for 1 dose. May repeat in 2 hours if headache persists or recurs. Patient not taking: Reported on 06/08/2018 11/14/17 05/09/18  Kerman Passey, MD  SUMAtriptan (IMITREX) 20 MG/ACT nasal spray Place 1 spray (20 mg total) into the nose  once for 1 dose. May repeat in 2 hours if headache persists or recurs. Patient taking differently: Place 20 mg into the nose once as needed. May repeat in 2 hours if headache persists or recurs. 11/14/17 05/09/18  Kerman Passey, MD  traZODone (DESYREL) 50 MG tablet Take 0.5-1 tablets (25-50 mg total) by mouth at bedtime as needed for sleep. Patient not taking: Reported on 06/08/2018 05/12/18   Kerman Passey, MD      PHYSICAL EXAMINATION:   VITAL SIGNS: Blood pressure (!) 139/103, pulse 79, temperature 97.8 F (36.6 C), temperature source Oral, resp. rate 18, SpO2 96 %.  GENERAL:  67 y.o.-year-old patient lying in the bed with no acute  distress.  EYES: Pupils equal, round, reactive to light and accommodation. No scleral icterus. Extraocular muscles intact.  HEENT: Head atraumatic, normocephalic. Oropharynx and nasopharynx clear.  NECK:  Supple, no jugular venous distention. No thyroid enlargement, no tenderness.  LUNGS: Normal breath sounds bilaterally, no wheezing, rales,rhonchi or crepitation. No use of accessory muscles of respiration.  CARDIOVASCULAR: S1, S2 normal. No murmurs, rubs, or gallops.  ABDOMEN: Soft, nontender, nondistended. Bowel sounds present. No organomegaly or mass.  EXTREMITIES: No pedal edema, cyanosis, or clubbing.  NEUROLOGIC: Cranial nerves II through XII are intact. Muscle strength 5/5 in all extremities. Sensation intact. Gait not checked.  Has some shaking with anxiety. PSYCHIATRIC: The patient is alert and oriented x 3.  SKIN: No obvious rash, lesion, or ulcer.   LABORATORY PANEL:   CBC Recent Labs  Lab 06/08/18 1404  WBC 8.4  HGB 11.6*  HCT 35.5  PLT 429  MCV 103.8*  MCH 33.9  MCHC 32.7  RDW 16.4*   ------------------------------------------------------------------------------------------------------------------  Chemistries  Recent Labs  Lab 06/08/18 1435  NA 142  K 4.5  CL 111  CO2 18*  GLUCOSE 106*  BUN 14  CREATININE 0.67  CALCIUM 8.0*   AST 32  ALT 17  ALKPHOS 50  BILITOT 1.1   ------------------------------------------------------------------------------------------------------------------ CrCl cannot be calculated (Unknown ideal weight.). ------------------------------------------------------------------------------------------------------------------ No results for input(s): TSH, T4TOTAL, T3FREE, THYROIDAB in the last 72 hours.  Invalid input(s): FREET3   Coagulation profile No results for input(s): INR, PROTIME in the last 168 hours. ------------------------------------------------------------------------------------------------------------------- No results for input(s): DDIMER in the last 72 hours. -------------------------------------------------------------------------------------------------------------------  Cardiac Enzymes No results for input(s): CKMB, TROPONINI, MYOGLOBIN in the last 168 hours.  Invalid input(s): CK ------------------------------------------------------------------------------------------------------------------ Invalid input(s): POCBNP  ---------------------------------------------------------------------------------------------------------------  Urinalysis    Component Value Date/Time   COLORURINE AMBER (A) 06/08/2018 1435   APPEARANCEUR CLOUDY (A) 06/08/2018 1435   LABSPEC 1.016 06/08/2018 1435   PHURINE 5.0 06/08/2018 1435   GLUCOSEU NEGATIVE 06/08/2018 1435   HGBUR NEGATIVE 06/08/2018 1435   BILIRUBINUR NEGATIVE 06/08/2018 1435   KETONESUR 20 (A) 06/08/2018 1435   PROTEINUR NEGATIVE 06/08/2018 1435   NITRITE NEGATIVE 06/08/2018 1435   LEUKOCYTESUR SMALL (A) 06/08/2018 1435     RADIOLOGY: Ct Head Wo Contrast  Result Date: 06/08/2018 CLINICAL DATA:  Nausea, vomiting and diarrhea beginning this morning, dizziness. History of hypertension, migraine. EXAM: CT HEAD WITHOUT CONTRAST TECHNIQUE: Contiguous axial images were obtained from the base of the skull through the  vertex without intravenous contrast. COMPARISON:  MRI of the head August 06, 2005 FINDINGS: BRAIN: No intraparenchymal hemorrhage, mass effect nor midline shift. Mild parenchymal brain volume loss. No hydrocephalus. Patchy supratentorial white matter hypodensities within normal range for patient's age, though non-specific are most compatible with chronic small vessel ischemic disease. No acute large vascular territory infarcts. No abnormal extra-axial fluid collections. Basal cisterns are patent. VASCULAR: Mild-to-moderate calcific atherosclerosis of the carotid siphons. SKULL: No skull fracture. No significant scalp soft tissue swelling. 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 SINUSES/ORBITS: Trace paranasal sinus mucosal thickening. Mastoid air cells are well aerated.The included ocular globes and orbital contents are non-suspicious. OTHER: None. IMPRESSION: 1. No acute intracranial process. 2. Mild parenchymal brain volume loss for age. 3. Mild chronic small vessel ischemic changes. Electronically Signed   By: Awilda Metroourtnay  Bloomer M.D.   On: 06/08/2018 17:53   Ct Abdomen Pelvis W Contrast  Result Date: 06/08/2018 CLINICAL DATA:  Pt poor historian. Pt states N/V/D started this AM. Pt denies any pain or  abd/pelvic discomfort. Pt states dizziness also started today. Pt states UTI x 1 month. EXAM: CT ABDOMEN AND PELVIS WITH CONTRAST TECHNIQUE: Multidetector CT imaging of the abdomen and pelvis was performed using the standard protocol following bolus administration of intravenous contrast. CONTRAST:  OMNIPAQUE IOHEXOL 300 MG/ML  SOLN COMPARISON:  CT of the abdomen and pelvis on 07/26/2006 FINDINGS: Lower chest: There is minimal bibasilar atelectasis. Heart size is normal. No pericardial effusion or significant coronary artery calcifications. Hepatobiliary: No focal liver abnormality is seen. No radiopaque gallstones, biliary dilatation, or pericholecystic inflammatory changes.  Pancreas: Unremarkable. No pancreatic ductal dilatation or surrounding inflammatory changes. Spleen: Normal in size without focal abnormality. Adrenals/Urinary Tract: Punctate intrarenal calculus is identified in the LOWER pole LEFT kidney, measuring 3 millimeters. No hydronephrosis. Adrenal glands are normal. Urinary bladder is unremarkable. Stomach/Bowel: The stomach and small bowel loops are normal in appearance. Colon is normal in appearance. The appendix is well seen and has a normal appearance. Vascular/Lymphatic: There is atherosclerotic calcification of the abdominal aorta. No aneurysm. Although involved by atherosclerosis, there is vascular opacification of the celiac axis, superior mesenteric artery, and inferior mesenteric artery. Normal appearance of the portal venous system and inferior vena cava. Reproductive: Hysterectomy.  No adnexal mass. Other: No abdominal wall hernia or abnormality. No abdominopelvic ascites. Musculoskeletal: Mid lumbar degenerative changes. IMPRESSION: 1. No acute abnormality of the abdomen and pelvis. 2.  Aortic atherosclerosis.  (ICD10-I70.0) 3. Nonobstructing LEFT intrarenal stone measures 3 millimeters. 4. Normal appendix. Electronically Signed   By: Norva Pavlov M.D.   On: 06/08/2018 17:51    EKG: Orders placed or performed during the hospital encounter of 06/08/18  . ED EKG  . ED EKG  . EKG 12-Lead  . EKG 12-Lead    IMPRESSION AND PLAN:  *Dizziness This is significantly disproportionate to her UTI. Likely BPPV or a lacunar stroke. Will start on meclizine oral and get an MRI on brain. If does not recover, will get physical therapy for vestibular therapy.  *UTI IV ceftriaxone and send urine culture.  *Hypertension Continue atenolol and losartan.  *Anxiety Continue Xanax.  All the records are reviewed and case discussed with ED provider. Management plans discussed with the patient, family and they are in agreement.  CODE STATUS: Full  code. Code Status History    Date Active Date Inactive Code Status Order ID Comments User Context   05/14/2018 1617 05/17/2018 1719 Full Code [id number]  Lyndle Herrlich, MD Inpatient       TOTAL TIME TAKING CARE OF THIS PATIENT: 45 minutes.    Altamese Dilling M.D on 06/08/2018   Between 7am to 6pm - Pager - [phone]  After 6pm go to [email or web address] - password [employer]  [facility]  Office  [phone]  CC: Primary care physician; Kerman Passey, MD   Note: This dictation was prepared with Dragon dictation along with smaller phrase technology. Any transcriptional errors that result from this process are unintentional.

## 2018-06-08 NOTE — ED Notes (Signed)
Redraw of light green sent to lab at this time.

## 2018-06-08 NOTE — ED Triage Notes (Signed)
Pt arrives to ED via [facility] from home for N&V&D that began this AM around 7. EMS reports CBG 119, VSS. Alert, oriented upon arrival. Denies pain.

## 2018-06-08 NOTE — ED Notes (Signed)
Pt stated she had defecated on self. While cleaning pt no stool noted to pt. Stool noted to underwear but pt clean of stool. No diarrhea noted. Pt then stated she needed to urinate. Placed on bedpan. Tolerated well. Cleaned after urinating. Chuck and brief placed on pt. Pt able to move self around on stretcher.

## 2018-06-08 NOTE — ED Notes (Signed)
Pt returned from scans via stretcher.  

## 2018-06-08 NOTE — ED Notes (Signed)
Assisted pt with getting on bedpan. Pt was able to bend her knees and lift her hips without any problems or complaints. Pt is in NAD at this time.

## 2018-06-08 NOTE — ED Notes (Signed)
Admitting MD at bedside.

## 2018-06-08 NOTE — ED Provider Notes (Signed)
[facility] Emergency Department Provider Note   ____________________________________________   First MD Initiated Contact with Patient 06/08/18 1424     (approximate)  I have reviewed the triage vital signs and the nursing notes.   HISTORY  Chief Complaint Emesis and Diarrhea    HPI Ashley [name] is a 67 y.o. female history of COPD, previous bowel obstruction.  Right knee replacement urinary tract infection  A couple months ago the patient had her knee replaced on the right.  She reports that she then had a urinary tract infection afterward.  She reports she started feeling a little bit funny with some abnormality feeling with urination yesterday.  Then today she began experiencing nausea and vomited about 4 times this morning when she woke up, and had 2 loose stools.  No black or bloody stool.  She did not throw up any vomit that was black or bloody either.  She reports that since then she was not able to get up out of bed this afternoon because she just felt so lightheaded.  She reports it feels like she is just very tired and weak, and when she moves her head back and forth she reports she starts to feel very lightheaded as well.  She reports is not very painful, but rather feels some nausea discomfort with urination, and also 2 episodes of diarrhea.  Reports similar feeling somewhat when she had a UTI last.  Denies headache.  No numbness or tingling in the arms or legs.  No weakness in arm or leg.   Past Medical History:  Diagnosis Date  . ADHD (attention deficit hyperactivity disorder) 05/02/2015  . Anemia   . Anxiety   . Arthritis   . Attention deficit disorder   . Autoimmune liver disease   . Barrett's esophagus   . COPD (chronic obstructive pulmonary disease) (HCC)   . Depression   . Gastritis   . GERD (gastroesophageal reflux disease)   . Hypertension   . Insomnia   . Internal hemorrhoids   . Liver failure (HCC)   . Migraine   . Ovarian  failure   . Peptic ulcer disease   . Small bowel obstruction [facility])     Patient Active Problem List   Diagnosis Date Noted  . Status post right knee replacement 05/14/2018  . Alcohol use 05/12/2018  . Vitamin B12 deficiency 05/12/2018  . Macrocytosis without anemia 09/10/2016  . Hand cramps 09/10/2016  . Medication monitoring encounter 09/10/2016  . Controlled substance agreement signed 04/10/2016  . Allergic rhinitis 09/19/2015  . Arthritis of knee, degenerative 09/19/2015  . ADHD (attention deficit hyperactivity disorder) 05/02/2015  . Special screening for malignant neoplasms, colon 02/02/2014  . CONSTIPATION 04/22/2008  . INTERNAL HEMORRHOIDS 04/21/2008  . COPD (chronic obstructive pulmonary disease) (HCC) 04/21/2008  . BARRETTS ESOPHAGUS 04/21/2008  . DEPRESSION, HX OF 04/21/2008  . PEPTIC ULCER DISEASE, HX OF 04/21/2008  . SMALL BOWEL OBSTRUCTION, HX OF 04/21/2008  . Benign essential HTN 01/26/2008  . Cannot sleep 01/26/2008  . Migraine with aura 01/26/2008    Past Surgical History:  Procedure Laterality Date  . ABDOMINAL HYSTERECTOMY    . DILATION AND CURETTAGE OF UTERUS    . LIVER BIOPSY    . OOPHORECTOMY    . TOTAL KNEE ARTHROPLASTY Right 05/14/2018   Procedure: TOTAL KNEE ARTHROPLASTY;  Surgeon: Lyndle Herrlich, MD;  Location: [facility] ORS;  Service: Orthopedics;  Laterality: Right;    Prior to Admission medications   Medication Sig  Start Date End Date Taking? Authorizing Provider  ALPRAZolam Prudy Feeler) 1 MG tablet Take 1 tablet (1 mg total) by mouth at bedtime as needed for anxiety. 05/16/18   Lyndle Herrlich, MD  amLODipine (NORVASC) 10 MG tablet Take 1 tablet (10 mg total) by mouth daily. 05/09/18   Poulose, Percell Belt, NP  aspirin 81 MG chewable tablet Chew 1 tablet (81 mg total) by mouth 2 (two) times daily. 05/16/18   Lyndle Herrlich, MD  atenolol (TENORMIN) 100 MG tablet TAKE 1 TABLET BY MOUTH TWICE DAILY 09/30/17   Lada, Janit Bern, MD  cephALEXin (KEFLEX) 500 MG  capsule Take 1 capsule (500 mg total) by mouth 2 (two) times daily for 7 days. 06/08/18 06/15/18  Sharyn Creamer, MD  Cholecalciferol (VITAMIN D3) 5000 units CAPS Take 1 capsule by mouth daily.    [provider]  Cyanocobalamin (VITAMIN B 12 PO) Take 1,000 mcg by mouth daily.    [provider]  docusate sodium (COLACE) 100 MG capsule Take 1 capsule (100 mg total) by mouth 2 (two) times daily. 05/16/18   Lyndle Herrlich, MD  lisdexamfetamine (VYVANSE) 30 MG capsule Take 1 capsule (30 mg total) by mouth daily. 05/17/18   Lyndle Herrlich, MD  loratadine (CLARITIN) 10 MG tablet Take 10 mg by mouth daily.    [provider]  losartan (COZAAR) 50 MG tablet TAKE ONE TABLET BY MOUTH EVERY DAY 02/11/18   Lada, Janit Bern, MD  omeprazole (PRILOSEC) 20 MG capsule Take 1 capsule (20 mg total) by mouth daily. 05/12/18   Lada, Janit Bern, MD  ondansetron (ZOFRAN ODT) 4 MG disintegrating tablet Take 1 tablet (4 mg total) by mouth every 6 (six) hours as needed for nausea or vomiting. 06/08/18   Sharyn Creamer, MD  oxyCODONE (OXY IR/ROXICODONE) 5 MG immediate release tablet Take 1 tablet (5 mg total) by mouth every 4 (four) hours as needed for moderate pain. 05/16/18   Lyndle Herrlich, MD  sulfamethoxazole-trimethoprim (BACTRIM DS,SEPTRA DS) 800-160 MG tablet Take 1 tablet by mouth 2 (two) times daily. 05/09/18   Poulose, Percell Belt, NP  SUMAtriptan (IMITREX) 100 MG tablet Take 1 tablet (100 mg total) by mouth once for 1 dose. May repeat in 2 hours if headache persists or recurs. Patient taking differently: Take 100 mg by mouth once as needed. May repeat in 2 hours if headache persists or recurs. 11/14/17 05/09/18  Kerman Passey, MD  SUMAtriptan (IMITREX) 20 MG/ACT nasal spray Place 1 spray (20 mg total) into the nose once for 1 dose. May repeat in 2 hours if headache persists or recurs. Patient taking differently: Place 20 mg into the nose once as needed. May repeat in 2 hours if headache persists or recurs.  11/14/17 05/09/18  Kerman Passey, MD  traZODone (DESYREL) 50 MG tablet Take 0.5-1 tablets (25-50 mg total) by mouth at bedtime as needed for sleep. 05/12/18   Kerman Passey, MD  Vitamin D, Ergocalciferol, (DRISDOL) 50000 units CAPS capsule Take 50,000 Units by mouth every 7 (seven) days.    [provider]    Allergies Topamax [topiramate] and Tylenol [acetaminophen]  Family History  Problem Relation Age of Onset  . Heart failure Father   . Atrial fibrillation Mother   . Cerebral palsy Brother   . Heart attack Maternal Grandfather   . CAD Paternal Grandmother   . Colon cancer Neg Hx     Social History Social History   Tobacco Use  . Smoking  status: Former Smoker    Last attempt to quit: 11/05/1988    Years since quitting: 29.6  . Smokeless tobacco: Never Used  Substance Use Topics  . Alcohol use: Yes    Alcohol/week: 4.2 oz    Types: 7 Glasses of wine per week  . Drug use: No    Review of Systems Constitutional: No fever/chills but does feel fatigued and very "lightheaded" when she tries to move her head or if she tries to get up. Eyes: No visual changes. ENT: No sore throat. Cardiovascular: Denies chest pain. Respiratory: Denies shortness of breath. Gastrointestinal: No abdominal pain.  No constipation. Genitourinary: Negative for dysuria. Musculoskeletal: Negative for back pain. Skin: Negative for rash. Neurological: Negative for headaches, focal weakness or numbness.    ____________________________________________   PHYSICAL EXAM:  VITAL SIGNS: ED Triage Vitals  Enc Vitals Group     BP 06/08/18 1403 (!) 149/72     Pulse Rate 06/08/18 1403 62     Resp 06/08/18 1403 18     Temp 06/08/18 1403 97.8 F (36.6 C)     Temp Source 06/08/18 1403 Oral     SpO2 06/08/18 1403 98 %     Weight --      Height --      Head Circumference --      Peak Flow --      Pain Score 06/08/18 1404 0     Pain Loc --      Pain Edu? --      Excl. in GC? --      Constitutional: Alert and oriented. Well appearing and in no acute distress.  Only.  Appears slightly anxious. Eyes: Conjunctivae are normal. Head: Atraumatic. Nose: No congestion/rhinnorhea. Mouth/Throat: Mucous membranes are slightly dry. Neck: No stridor.   Cardiovascular: Normal rate, regular rhythm. Grossly normal heart sounds.  Good peripheral circulation. Respiratory: Normal respiratory effort.  No retractions. Lungs CTAB. Gastrointestinal: Soft and nontender. No distention. Musculoskeletal: No lower extremity tenderness nor edema.  Moves extremities well without any focality or deficits. Neurologic:  Normal speech and language. No gross focal neurologic deficits are appreciated.  Skin:  Skin is warm, dry and intact. No rash noted. Psychiatric: Mood and affect are slightly anxious. Speech and behavior are normal.  ____________________________________________   LABS (all labs ordered are listed, but only abnormal results are displayed)  Labs Reviewed  CBC - Abnormal; Notable for the following components:      Result Value   RBC 3.42 (*)    Hemoglobin 11.6 (*)    MCV 103.8 (*)    RDW 16.4 (*)    All other components within normal limits  URINALYSIS, COMPLETE (UACMP) WITH MICROSCOPIC - Abnormal; Notable for the following components:   Color, Urine AMBER (*)    APPearance CLOUDY (*)    Ketones, ur 20 (*)    Leukocytes, UA SMALL (*)    Bacteria, UA MANY (*)    All other components within normal limits  COMPREHENSIVE METABOLIC PANEL - Abnormal; Notable for the following components:   CO2 18 (*)    Glucose, Bld 106 (*)    Calcium 8.0 (*)    Total Protein 6.0 (*)    Albumin 3.0 (*)    All other components within normal limits  C DIFFICILE QUICK SCREEN W PCR REFLEX  GASTROINTESTINAL PANEL BY PCR, STOOL (REPLACES STOOL CULTURE)  LIPASE, BLOOD    ____________________________________________    RADIOLOGY   ____________________________________________   PROCEDURES  Procedure(s) performed: None  Procedures  Critical Care performed: No  ____________________________________________   INITIAL IMPRESSION / ASSESSMENT AND PLAN / ED COURSE  Pertinent labs & imaging results that were available during my care of the patient were reviewed by me and considered in my medical decision making (see chart for details).  Patient presents for evaluation 1 of nausea vomiting and loose stools.  She does report a urinary tract infection that occurred after surgery that had a similar feeling.  She denies abdominal pain her abdominal exam is quite reassuring.  She did have one formed bowel movement here in the emergency room.  No fevers or chills.  Normal vital signs.  She does report a history of a previous bowel obstruction.  Her lab work is quite reassuring except she does demonstrate a UTI.  Plan to hydrate her well, she also reports a lightheadedness that is somewhat unclear to me if it is vertigo-like in nature she reports worsened by head movements but also by trying to sit up but at the same time associated with nausea vomiting and loose stools and feeling of your dysuria.  The constellation of symptoms to me seem to suggest is most likely urinary tract infection, possibly associated with some dehydration or potentially colitis/enteritis, pyelonephritis given the nausea vomiting.  Because of the history of bowel obstruction and her presentation today we will obtain a CT scan to evaluate and rule out obstruction or other acute intra-abdominal pathology, as well because of the vertiginous-like nature of her "lightheadedness" we will also obtain a CT in the low likelihood there could be a potential central neurologic process though my clinical exam and history seem to highly doubt it.  ----------------------------------------- 4:44 PM on  06/08/2018 -----------------------------------------  Patient medicated with meclizine and Ativan for lightheadedness and vertiginous feeling.  Hydrated with 1 L of fluid, with some improvement in symptoms at this point.  Awaiting CT scan.  Ongoing care assigned to Dr. Pershing ProudSchaevitz, will reevaluate the patient after CT scans.  Anticipate if able to be discharged that she would be discharged with prescriptions for antiemetic and Keflex for treatment of UTI pending follow-up on CT scans and reevaluation.      ____________________________________________   FINAL CLINICAL IMPRESSION(S) / ED DIAGNOSES  Final diagnoses:  Dehydration, mild  Urinary tract infection, acute  Lightheadedness  Nausea vomiting and diarrhea      NEW MEDICATIONS STARTED DURING THIS VISIT:  New Prescriptions   CEPHALEXIN (KEFLEX) 500 MG CAPSULE    Take 1 capsule (500 mg total) by mouth 2 (two) times daily for 7 days.   ONDANSETRON (ZOFRAN ODT) 4 MG DISINTEGRATING TABLET    Take 1 tablet (4 mg total) by mouth every 6 (six) hours as needed for nausea or vomiting.     Note:  This document was prepared using Dragon voice recognition software and may include unintentional dictation errors.     Sharyn CreamerQuale, Mark, MD 06/08/18 [id number]

## 2018-06-08 NOTE — ED Provider Notes (Signed)
Signout from Dr. Fanny BienQuale in this 67 year old female with recent knee replacement and urinary tract infection.  Patient says that that she has been feeling dizzy.  Given fluids as well as meclizine and Ativan.  Plan is to reassess after CT scans and disposition appropriately.  Physical Exam  BP (!) 149/72   Pulse 62   Temp 97.8 F (36.6 C) (Oral)   Resp 13   SpO2 98%  ----------------------------------------- 6:25 PM on 06/08/2018 -----------------------------------------   Physical Exam Patient says that she is not feeling as vertiginous and is not having any nausea or diarrhea.  However, says that she still feels weak all over.  She was unable to walk at the bedside because she says that she feels weak and that she is going to fall over.  This is after the fluids, meclizine and Ativan. ED Course/Procedures     Procedures  MDM  CAT scan of the head as well as the abdomen without any acute process.  Patient received multiple medications and still cannot ambulate.  Will require further antibiotic treatment and possible further neurologic work-up.  Signed out to Dr. Elisabeth PigeonVachhani.  Patient aware of need for admission to the hospital and willing to comply.       Myrna BlazerSchaevitz, Leshawn Straka Matthew, MD 06/08/18 [phone]

## 2018-06-08 NOTE — ED Notes (Signed)
Dawn RN, aware of bed assigned 

## 2018-06-09 ENCOUNTER — Other Ambulatory Visit: Payer: Self-pay

## 2018-06-09 LAB — BASIC METABOLIC PANEL
ANION GAP: 8 (ref 5–15)
BUN: 19 mg/dL (ref 8–23)
CALCIUM: 9.1 mg/dL (ref 8.9–10.3)
CO2: 27 mmol/L (ref 22–32)
Chloride: 105 mmol/L (ref 98–111)
Creatinine, Ser: 0.83 mg/dL (ref 0.44–1.00)
GFR calc non Af Amer: >60 mL/min (ref >60)
Glucose, Bld: 122 mg/dL — ABNORMAL HIGH (ref 70–99)
Potassium: 3.8 mmol/L (ref 3.5–5.1)
Sodium: 140 mmol/L (ref 135–145)

## 2018-06-09 LAB — CBC
HCT: 33.4 % — ABNORMAL LOW (ref 35.0–47.0)
Hemoglobin: 11.4 g/dL — ABNORMAL LOW (ref 12.0–16.0)
MCH: 34.9 pg — ABNORMAL HIGH (ref 26.0–34.0)
MCHC: 34.1 g/dL (ref 32.0–36.0)
MCV: 102.3 fL — AB (ref 80.0–100.0)
PLATELETS: 380 10*3/uL (ref 150–440)
RBC: 3.27 MIL/uL — ABNORMAL LOW (ref 3.80–5.20)
RDW: 16.3 % — AB (ref 11.5–14.5)
WBC: 4.3 10*3/uL (ref 3.6–11.0)

## 2018-06-09 MED ORDER — SUMATRIPTAN SUCCINATE 50 MG PO TABS
100.0000 mg | ORAL_TABLET | ORAL | Status: DC | PRN
  Administered 2018-06-09: 100 mg via ORAL
  Filled 2018-06-09 (×2): qty 2

## 2018-06-09 NOTE — Progress Notes (Signed)
[employer] - [place] [facility]   PATIENT NAME: Ashley [name]    MR#:  [id number]  DATE OF BIRTH:  November 24, 1950  SUBJECTIVE:  Doing much better. Dizziness has improved. Still feeling a little dizzy and needing assistance to walk to the bathroom.  REVIEW OF SYSTEMS:  Review of Systems  Constitutional: Negative for chills and fever.  HENT: Negative for sinus pain and sore throat.   Eyes: Negative for blurred vision and double vision.  Respiratory: Negative for cough and sputum production.   Cardiovascular: Negative for chest pain and palpitations.  Gastrointestinal: Negative for nausea and vomiting.  Genitourinary: Negative for dysuria and urgency.  Musculoskeletal: Negative for back pain and neck pain.  Neurological: Positive for dizziness. Negative for headaches.  Psychiatric/Behavioral: Negative for depression. The patient is not nervous/anxious.       DRUG ALLERGIES:   Allergies  Allergen Reactions  . Topamax [Topiramate] Other (See Comments)    "shut my body down."  . Tylenol [Acetaminophen] Other (See Comments)    Elevated liver enzymes    VITALS:  Blood pressure 138/83, pulse 68, temperature 99.1 F (37.3 C), temperature source Oral, resp. rate 17, height 5\' 4"  (1.626 m), weight 61.4 kg (135 lb 6.4 oz), SpO2 96 %. PHYSICAL EXAMINATION:  Physical Exam  GENERAL:  67 y.o.-year-old patient sitting in the bed with no acute distress, pleasant.  EYES: Pupils equal, round, reactive to light and accommodation. No scleral icterus. Extraocular muscles intact.  HEENT: Head atraumatic, normocephalic. MMM. NECK:  Supple, no jugular venous distention. No thyroid enlargement, no tenderness.  LUNGS: Normal breath sounds bilaterally, no wheezing, rales,rhonchi or crepitation. No use of accessory muscles of respiration.  CARDIOVASCULAR: S1, S2 normal. No murmurs, rubs, or gallops.  ABDOMEN: Soft, nontender, nondistended. Bowel sounds present. No organomegaly or  mass.  EXTREMITIES: No pedal edema, cyanosis, or clubbing.  NEUROLOGIC: Cranial nerves II through XII are intact. Muscle strength 5/5 in all extremities. Sensation intact. Normal finger-to-nose testing. PSYCHIATRIC: The patient is alert and oriented x 3.  SKIN: No obvious rash, lesion, or ulcer.   LABORATORY PANEL:  Female CBC Recent Labs  Lab 06/09/18 0445  WBC 4.3  HGB 11.4*  HCT 33.4*  PLT 380   ------------------------------------------------------------------------------------------------------------------ Chemistries  Recent Labs  Lab 06/08/18 1435 06/09/18 0445  NA 142 140  K 4.5 3.8  CL 111 105  CO2 18* 27  GLUCOSE 106* 122*  BUN 14 19  CREATININE 0.67 0.83  CALCIUM 8.0* 9.1  AST 32  --   ALT 17  --   ALKPHOS 50  --   BILITOT 1.1  --    RADIOLOGY:  Ct Head Wo Contrast  Result Date: 06/08/2018 CLINICAL DATA:  Nausea, vomiting and diarrhea beginning this morning, dizziness. History of hypertension, migraine. EXAM: CT HEAD WITHOUT CONTRAST TECHNIQUE: Contiguous axial images were obtained from the base of the skull through the vertex without intravenous contrast. COMPARISON:  MRI of the head August 06, 2005 FINDINGS: BRAIN: No intraparenchymal hemorrhage, mass effect nor midline shift. Mild parenchymal brain volume loss. No hydrocephalus. Patchy supratentorial white matter hypodensities within normal range for patient's age, though non-specific are most compatible with chronic small vessel ischemic disease. No acute large vascular territory infarcts. No abnormal extra-axial fluid collections. Basal cisterns are patent. VASCULAR: Mild-to-moderate calcific atherosclerosis of the carotid siphons. SKULL: No skull fracture. No significant scalp soft tissue swelling. 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 SINUSES/ORBITS: Trace paranasal sinus mucosal thickening. Mastoid air cells  are well aerated.The included ocular globes and orbital contents are  non-suspicious. OTHER: None. IMPRESSION: 1. No acute intracranial process. 2. Mild parenchymal brain volume loss for age. 3. Mild chronic small vessel ischemic changes. Electronically Signed   By: Awilda Metro M.D.   On: 06/08/2018 17:53   Mr Brain Wo Contrast  Result Date: 06/08/2018 CLINICAL DATA:  67 y/o F; Nausea, vomiting and diarrhea beginning this morning, dizziness. History of hypertension, migraine. EXAM: MRI HEAD WITHOUT CONTRAST TECHNIQUE: Multiplanar, multiecho pulse sequences of the brain and surrounding structures were obtained without intravenous contrast. COMPARISON:  06/08/2018 CT head FINDINGS: Brain: No acute infarction, hemorrhage, hydrocephalus, extra-axial collection or mass lesion. Several nonspecific foci of T2 FLAIR hyperintense signal abnormality in subcortical and periventricular white matter are compatible with mild chronic microvascular ischemic changes for age. Mild diffuse brain parenchymal volume loss. Vascular: Normal flow voids. Skull and upper cervical spine: Normal marrow signal. Sinuses/Orbits: Negative. Other: Several dermal nodules with T1 hyperintense and T2 hypointense signal within the scalp. The largest is in the left occipital region measuring 18 mm. Findings likely represent proteinaceous dermal appendage cysts. IMPRESSION: 1. No acute intracranial abnormality identified. 2. Mild chronic microvascular ischemic changes and volume loss of the brain. Electronically Signed   By: Mitzi Hansen M.D.   On: 06/08/2018 22:36   Ct Abdomen Pelvis W Contrast  Result Date: 06/08/2018 CLINICAL DATA:  Pt poor historian. Pt states N/V/D started this AM. Pt denies any pain or abd/pelvic discomfort. Pt states dizziness also started today. Pt states UTI x 1 month. EXAM: CT ABDOMEN AND PELVIS WITH CONTRAST TECHNIQUE: Multidetector CT imaging of the abdomen and pelvis was performed using the standard protocol following bolus administration of intravenous contrast.  CONTRAST:  OMNIPAQUE IOHEXOL 300 MG/ML  SOLN COMPARISON:  CT of the abdomen and pelvis on 07/26/2006 FINDINGS: Lower chest: There is minimal bibasilar atelectasis. Heart size is normal. No pericardial effusion or significant coronary artery calcifications. Hepatobiliary: No focal liver abnormality is seen. No radiopaque gallstones, biliary dilatation, or pericholecystic inflammatory changes. Pancreas: Unremarkable. No pancreatic ductal dilatation or surrounding inflammatory changes. Spleen: Normal in size without focal abnormality. Adrenals/Urinary Tract: Punctate intrarenal calculus is identified in the LOWER pole LEFT kidney, measuring 3 millimeters. No hydronephrosis. Adrenal glands are normal. Urinary bladder is unremarkable. Stomach/Bowel: The stomach and small bowel loops are normal in appearance. Colon is normal in appearance. The appendix is well seen and has a normal appearance. Vascular/Lymphatic: There is atherosclerotic calcification of the abdominal aorta. No aneurysm. Although involved by atherosclerosis, there is vascular opacification of the celiac axis, superior mesenteric artery, and inferior mesenteric artery. Normal appearance of the portal venous system and inferior vena cava. Reproductive: Hysterectomy.  No adnexal mass. Other: No abdominal wall hernia or abnormality. No abdominopelvic ascites. Musculoskeletal: Mid lumbar degenerative changes. IMPRESSION: 1. No acute abnormality of the abdomen and pelvis. 2.  Aortic atherosclerosis.  (ICD10-I70.0) 3. Nonobstructing LEFT intrarenal stone measures 3 millimeters. 4. Normal appendix. Electronically Signed   By: Norva Pavlov M.D.   On: 06/08/2018 17:51   ASSESSMENT AND PLAN:   *Vertigo- likely BPPV. Has gotten much better with meclizine. MRI negative for stroke. - continue po meclizine - still needing assistance with ambulation- will get PT consult  *UTI- no signs of sepsis - continue IV ceftriaxone, will plan to change to po  tomorrow - urine culture pending  *Hypertension- BPs have been well-controlled - continue atenolol and losartan.  *Anxiety- stable - continue Xanax.  All the records  are reviewed and case discussed with Care Management/Social Worker. Management plans discussed with the patient, family and they are in agreement.  CODE STATUS: Full Code  TOTAL TIME TAKING CARE OF THIS PATIENT: 30 minutes.   More than 50% of the time was spent in counseling/coordination of care: YES  POSSIBLE D/C tomorrow, DEPENDING ON CLINICAL CONDITION and after PT eval.   Jinny BlossomKaty D [name] M.D on 06/09/2018 at 3:36 PM  Between 7am to 6pm - Pager - [phone]  After 6pm go to [email or web address] - Social research officer, governmentpassword EPAS [facility]  [facility] [place] [facility]  Office  [phone]  CC: Primary care physician; Kerman PasseyLada, Ashley P, MD  Note: This dictation was prepared with Dragon dictation along with smaller phrase technology. Any transcriptional errors that result from this process are unintentional.

## 2018-06-09 NOTE — Progress Notes (Signed)
Pt c/o migraine headache and stated that she has been "taking Imitrex at home every time needed". No PRN pain medicine per MAR. Dr. Bosie HelperPrasanna paged and placed an order for PRN Imitrex PO. Will administer as ordered.

## 2018-06-10 LAB — BASIC METABOLIC PANEL
Anion gap: 6 (ref 5–15)
BUN: 27 mg/dL — AB (ref 8–23)
CO2: 27 mmol/L (ref 22–32)
CREATININE: 1.06 mg/dL — AB (ref 0.44–1.00)
Calcium: 9.4 mg/dL (ref 8.9–10.3)
Chloride: 110 mmol/L (ref 98–111)
GFR calc Af Amer: >60 mL/min (ref >60)
GFR, EST NON AFRICAN AMERICAN: 54 mL/min — AB (ref >60)
GLUCOSE: 92 mg/dL (ref 70–99)
POTASSIUM: 4 mmol/L (ref 3.5–5.1)
Sodium: 143 mmol/L (ref 135–145)

## 2018-06-10 LAB — CBC
HCT: 34.7 % — ABNORMAL LOW (ref 35.0–47.0)
Hemoglobin: 11.4 g/dL — ABNORMAL LOW (ref 12.0–16.0)
MCH: 33.9 pg (ref 26.0–34.0)
MCHC: 33 g/dL (ref 32.0–36.0)
MCV: 102.8 fL — AB (ref 80.0–100.0)
PLATELETS: 349 10*3/uL (ref 150–440)
RBC: 3.37 MIL/uL — ABNORMAL LOW (ref 3.80–5.20)
RDW: 15.7 % — AB (ref 11.5–14.5)
WBC: 4.8 10*3/uL (ref 3.6–11.0)

## 2018-06-10 LAB — HIV ANTIBODY (ROUTINE TESTING W REFLEX): HIV Screen 4th Generation wRfx: NONREACTIVE

## 2018-06-10 MED ORDER — MECLIZINE HCL 12.5 MG PO TABS: 12.5000 mg | ORAL_TABLET | Freq: Three times a day (TID) | ORAL | 0 refills | Status: DC | PRN

## 2018-06-10 MED ORDER — ONDANSETRON 4 MG PO TBDP: 4.0000 mg | ORAL_TABLET | Freq: Four times a day (QID) | ORAL | 0 refills | Status: DC | PRN

## 2018-06-10 MED ORDER — ALPRAZOLAM 1 MG PO TABS: 1.0000 mg | ORAL_TABLET | Freq: Every evening | ORAL | 0 refills | Status: DC | PRN

## 2018-06-10 MED ORDER — CEPHALEXIN 500 MG PO CAPS: 500.0000 mg | ORAL_CAPSULE | Freq: Four times a day (QID) | ORAL | 0 refills | Status: AC

## 2018-06-10 MED ORDER — HYDRALAZINE HCL 25 MG PO TABS
25.0000 mg | ORAL_TABLET | Freq: Once | ORAL | Status: AC
  Administered 2018-06-10: 25 mg via ORAL
  Filled 2018-06-10: qty 1

## 2018-06-10 MED ORDER — IBUPROFEN 400 MG PO TABS
400.0000 mg | ORAL_TABLET | Freq: Four times a day (QID) | ORAL | Status: DC | PRN
  Administered 2018-06-10: 400 mg via ORAL
  Filled 2018-06-10: qty 1

## 2018-06-10 MED ORDER — ASPIRIN 81 MG PO CHEW: 81.0000 mg | CHEWABLE_TABLET | Freq: Every day | ORAL | 0 refills | Status: DC

## 2018-06-10 NOTE — Progress Notes (Signed)
Patient's BP 170/106. MD Mayo Paged and made aware. veral orders received for hydralazine PO. See MAR.

## 2018-06-10 NOTE — Progress Notes (Signed)
Assumed care of patient. Pt awake in bed. Denies any needs at this time. Will continue to monitor.

## 2018-06-10 NOTE — Care Management Note (Signed)
Case Management Note  Patient Details  Name: Ashley [name] MRN: [id number] Date of Birth: Mar 26, 1951  Subjective/Objective:  Discharging today                  Action/Plan: Patient active with [facility]. Notified [facility] of discharge.   Expected Discharge Date:  06/10/18               Expected Discharge Plan:  Home w Home Health Services  In-House Referral:     Discharge planning Services  CM Consult  Post Acute Care Choice:  Home Health, Resumption of Svcs/PTA Provider Choice offered to:  Patient  DME Arranged:    DME Agency:     HH Arranged:  RN, PT, Nurse's Aide HH Agency:  [facility]  Status of Service:  Completed, signed off  If discussed at Long Length of Stay Meetings, dates discussed:    Additional Comments:  [name], RN 06/10/2018, 10:23 AM

## 2018-06-10 NOTE — Progress Notes (Signed)
Discharge instructions and med details reviewed with patient. All questions answered. Printed prescription and AVS given to patient. IV removed.

## 2018-06-10 NOTE — Discharge Summary (Addendum)
[facility]   PATIENT NAME: Ashley [name]    MR#:  [id number]  DATE OF BIRTH:  1950/12/14  DATE OF ADMISSION:  06/08/2018   ADMITTING PHYSICIAN: Altamese Dilling, MD  DATE OF DISCHARGE: 06/10/18  PRIMARY CARE PHYSICIAN: Kerman Passey, MD   ADMISSION DIAGNOSIS:  Lightheadedness [R42] Dehydration, mild [E86.0] Urinary tract infection, acute [N39.0] Nausea vomiting and diarrhea [R11.2, R19.7] DISCHARGE DIAGNOSIS:  Principal Problem:   Dizziness Active Problems:   UTI (urinary tract infection)  SECONDARY DIAGNOSIS:   Past Medical History:  Diagnosis Date  . ADHD (attention deficit hyperactivity disorder) 05/02/2015  . Anemia   . Anxiety   . Arthritis   . Attention deficit disorder   . Autoimmune liver disease   . Barrett's esophagus   . COPD (chronic obstructive pulmonary disease) (HCC)   . Depression   . Gastritis   . GERD (gastroesophageal reflux disease)   . Hypertension   . Insomnia   . Internal hemorrhoids   . Liver failure (HCC)   . Migraine   . Ovarian failure   . Peptic ulcer disease   . Small bowel obstruction [facility])    HOSPITAL COURSE:   Ashley [name] is a 67 year old female with a PMH of ADHD, anxiety, COPD, depression, HTN, liver failure, and recent right knee replacement who presented to the ED with dizziness. In the ED, she was noted to have a UTI. She continued to be significantly dizzy, so she was admitted for further management.  Dizziness- description consistent with vertigo, likely BPPV - CT head and MRI brain were negative for acute abnormalities - symptoms improved with meclizine - on the day of discharge, able to ambulate without any dizziness  UTI - initially treated with ceftriaxone - urine culture grew >100,000 colonies of E. Coli - culture susceptibilities were pending at the time of discharge - patient discharged on Keflex for a total 7 day course  Hypertension - BPs well controlled during  admission - continued home meds  Anxiety - continued home xanax  DISCHARGE CONDITIONS:  Stable, improved CONSULTS OBTAINED:  none DRUG ALLERGIES:   Allergies  Allergen Reactions  . Topamax [Topiramate] Other (See Comments)    "shut my body down."  . Tylenol [Acetaminophen] Other (See Comments)    Elevated liver enzymes    DISCHARGE MEDICATIONS:   Allergies as of 06/10/2018      Reactions   Topamax [topiramate] Other (See Comments)   "shut my body down."   Tylenol [acetaminophen] Other (See Comments)   Elevated liver enzymes       Medication List    STOP taking these medications   amLODipine 10 MG tablet Commonly known as:  NORVASC   oxyCODONE 5 MG immediate release tablet Commonly known as:  Oxy IR/ROXICODONE   SUMAtriptan 100 MG tablet Commonly known as:  IMITREX   traZODone 50 MG tablet Commonly known as:  DESYREL     TAKE these medications   ALPRAZolam 1 MG tablet Commonly known as:  XANAX Take 1 tablet (1 mg total) by mouth at bedtime as needed for anxiety.   aspirin 81 MG chewable tablet Chew 1 tablet (81 mg total) by mouth daily.   atenolol 100 MG tablet Commonly known as:  TENORMIN TAKE 1 TABLET BY MOUTH TWICE DAILY   cephALEXin 500 MG capsule Commonly known as:  KEFLEX Take 1 capsule (500 mg total) by mouth 4 (four) times daily for 5 days.   docusate sodium 100  MG capsule Commonly known as:  COLACE Take 1 capsule (100 mg total) by mouth 2 (two) times daily.   lisdexamfetamine 30 MG capsule Commonly known as:  VYVANSE Take 1 capsule (30 mg total) by mouth daily.   loratadine 10 MG tablet Commonly known as:  CLARITIN Take 10 mg by mouth daily as needed for allergies.   losartan 50 MG tablet Commonly known as:  COZAAR TAKE ONE TABLET BY MOUTH EVERY DAY   meclizine 12.5 MG tablet Commonly known as:  ANTIVERT Take 1 tablet (12.5 mg total) by mouth 3 (three) times daily as needed for dizziness.   omeprazole 20 MG capsule Commonly known  as:  PRILOSEC Take 1 capsule (20 mg total) by mouth daily.   ondansetron 4 MG disintegrating tablet Commonly known as:  ZOFRAN ODT Take 1 tablet (4 mg total) by mouth every 6 (six) hours as needed for nausea or vomiting.   SUMAtriptan 20 MG/ACT nasal spray Commonly known as:  IMITREX Place 1 spray (20 mg total) into the nose once for 1 dose. May repeat in 2 hours if headache persists or recurs. What changed:    when to take this  reasons to take this  additional instructions   VITAMIN B 12 PO Take 3,000 mcg by mouth daily.   Vitamin D3 5000 units Caps Take 1 capsule by mouth daily.       DISCHARGE INSTRUCTIONS:  1.  Follow-up with PCP in 1 to 2 weeks 2.  Patient prescribed meclizine to use 3 times a day as needed at home for vertigo 3.  Patient found to have a UTI.  Urine cultures were pending at the time of discharge.  Patient was prescribed Keflex 500 mg 4 times a day for a total course of 7 days.  Please follow-up with urine culture as an outpatient. 4.  Patient had a small elevation in creatinine from 0.83 to 1.06 on the day of discharge.  Please recheck creatinine as an outpatient. DIET:  Regular diet DISCHARGE CONDITION:  Stable ACTIVITY:  Activity as tolerated OXYGEN:  Home Oxygen: No.  Oxygen Delivery: room air DISCHARGE LOCATION:  home   If you experience worsening of your admission symptoms, develop shortness of breath, life threatening emergency, suicidal or homicidal thoughts you must seek medical attention immediately by calling 911 or calling your MD immediately  if symptoms less severe.  You Must read complete instructions/literature along with all the possible adverse reactions/side effects for all the Medicines you take and that have been prescribed to you. Take any new Medicines after you have completely understood and accpet all the possible adverse reactions/side effects.   Please note  You were cared for by a hospitalist during your hospital  stay. If you have any questions about your discharge medications or the care you received while you were in the hospital after you are discharged, you can call the unit and asked to speak with the hospitalist on call if the hospitalist that took care of you is not available. Once you are discharged, your primary care physician will handle any further medical issues. Please note that NO REFILLS for any discharge medications will be authorized once you are discharged, as it is imperative that you return to your primary care physician (or establish a relationship with a primary care physician if you do not have one) for your aftercare needs so that they can reassess your need for medications and monitor your lab values.    On the day of Discharge:  VITAL SIGNS:  Blood pressure (!) 168/95, pulse 66, temperature 98.3 F (36.8 C), temperature source Oral, resp. rate 18, height 5\' 4"  (1.626 m), weight 61.4 kg (135 lb 6.4 oz), SpO2 97 %. PHYSICAL EXAMINATION:  GENERAL:  67 y.o.-year-old patient lying in the bed with no acute distress.  EYES: Pupils equal, round, reactive to light and accommodation. No scleral icterus. Extraocular muscles intact.  HEENT: Head atraumatic, normocephalic. Oropharynx and nasopharynx clear.  NECK:  Supple, no jugular venous distention. No thyroid enlargement, no tenderness.  LUNGS: Normal breath sounds bilaterally, no wheezing, rales,rhonchi or crepitation. No use of accessory muscles of respiration.  CARDIOVASCULAR: S1, S2 normal. No murmurs, rubs, or gallops.  ABDOMEN: Soft, non-tender, non-distended. Bowel sounds present. No organomegaly or mass.  EXTREMITIES: No pedal edema, cyanosis, or clubbing.  NEUROLOGIC: Cranial nerves II through XII are intact. Muscle strength 5/5 in all extremities. Sensation intact. Gait not checked.  PSYCHIATRIC: The patient is alert and oriented x 3.  SKIN: No obvious rash, lesion, or ulcer.  DATA REVIEW:   CBC Recent Labs  Lab  06/10/18 0451  WBC 4.8  HGB 11.4*  HCT 34.7*  PLT 349    Chemistries  Recent Labs  Lab 06/08/18 1435  06/10/18 0451  NA 142   < > 143  K 4.5   < > 4.0  CL 111   < > 110  CO2 18*   < > 27  GLUCOSE 106*   < > 92  BUN 14   < > 27*  CREATININE 0.67   < > 1.06*  CALCIUM 8.0*   < > 9.4  AST 32  --   --   ALT 17  --   --   ALKPHOS 50  --   --   BILITOT 1.1  --   --    < > = values in this interval not displayed.     Microbiology Results  Results for orders placed or performed during the hospital encounter of 06/08/18  Urine Culture     Status: Abnormal (Preliminary result)   Collection Time: 06/08/18  2:35 PM  Result Value Ref Range Status   Specimen Description   Final    URINE, RANDOM Performed at [facility], [address] [place], [place] [address]    Special Requests   Final    NONE Performed at [facility], [address], [place], [place] [address]    Culture >=100,000 COLONIES/mL ESCHERICHIA COLI (A)  Final   Report Status PENDING  Incomplete  C difficile quick scan w PCR reflex     Status: None   Collection Time: 06/08/18  9:32 PM  Result Value Ref Range Status   C Diff antigen NEGATIVE NEGATIVE Final   C Diff toxin NEGATIVE NEGATIVE Final   C Diff interpretation No C. difficile detected.  Final    Comment: Performed at [facility], [address] [place], [place] [address]  Gastrointestinal Panel by PCR , Stool     Status: None   Collection Time: 06/08/18  9:32 PM  Result Value Ref Range Status   Campylobacter species NOT DETECTED NOT DETECTED Final   Plesimonas shigelloides NOT DETECTED NOT DETECTED Final   Salmonella species NOT DETECTED NOT DETECTED Final   Yersinia enterocolitica NOT DETECTED NOT DETECTED Final   Vibrio species NOT DETECTED NOT DETECTED Final   Vibrio cholerae NOT DETECTED NOT DETECTED Final   Enteroaggregative E coli (EAEC) NOT DETECTED NOT DETECTED Final  Enteropathogenic E coli (EPEC) NOT  DETECTED NOT DETECTED Final   Enterotoxigenic E coli (ETEC) NOT DETECTED NOT DETECTED Final   Shiga like toxin producing E coli (STEC) NOT DETECTED NOT DETECTED Final   Shigella/Enteroinvasive E coli (EIEC) NOT DETECTED NOT DETECTED Final   Cryptosporidium NOT DETECTED NOT DETECTED Final   Cyclospora cayetanensis NOT DETECTED NOT DETECTED Final   Entamoeba histolytica NOT DETECTED NOT DETECTED Final   Giardia lamblia NOT DETECTED NOT DETECTED Final   Adenovirus F40/41 NOT DETECTED NOT DETECTED Final   Astrovirus NOT DETECTED NOT DETECTED Final   Norovirus GI/GII NOT DETECTED NOT DETECTED Final   Rotavirus A NOT DETECTED NOT DETECTED Final   Sapovirus (I, II, IV, and V) NOT DETECTED NOT DETECTED Final    Comment: Performed at [facility], [address] [place], [place] [address]    RADIOLOGY:  No results found.   Management plans discussed with the patient, family and they are in agreement.  CODE STATUS: Full Code   TOTAL TIME TAKING CARE OF THIS PATIENT: 35 minutes.    Jinny BlossomKaty D Mayo M.D on 06/10/2018 at 4:05 PM  Between 7am to 6pm - Pager [phone]  After 6pm go to [email or web address] - Social research officer, governmentpassword EPAS [facility]  [facility]  Office  [phone]  CC: Primary care physician; Kerman PasseyLada, Melinda P, MD   Note: This dictation was prepared with Dragon dictation along with smaller phrase technology. Any transcriptional errors that result from this process are unintentional.

## 2018-06-10 NOTE — Evaluation (Signed)
Physical Therapy Evaluation Patient Details Name: Ashley RoanDebra M Mccollom MRN: [id number] DOB: 26-Feb-1951 Today's Date: 06/10/2018   History of Present Illness  Patient is 67 yo female s/p R TKA 05/14/18, WBAT with PMH of ADHD, COPD, HTN, admitted for dizziness, UTI  Clinical Impression  Patient A&Ox4 at start of session without complaints of pain or dizziness. Patient states that she lives with her 65 yo daughter in a 1 story home. States she has aides, family, and friends able to check on her, and is receiving HH PT s/p R TKA 05/14/18. Ambulates with SPC intermittently and is independent with ADLs. The patient demonstrated bed mobility and transfers mod I, ambulated ~26700ft with SPC and supervision, with slightly antalgic gait on RLE. Patient does exhibit balance deficits with higher level/dynamic balance tests, see below for details/DGI score. The patient would benefit from further skilled PT to address limitations (see below for details) including balance training to decrease risk of falls, maximize safety and independence. Current recommendation is HHPT to continue to address limitations due to R TKA and balance deficits.   Follow Up Recommendations Supervision - Intermittent;Home health PT    Equipment Recommendations  None recommended by PT(Patient has RW and SPC at home)    Recommendations for Other Services       Precautions / Restrictions Precautions Precautions: Fall Restrictions Weight Bearing Restrictions: No      Mobility  Bed Mobility Overal bed mobility: Modified Independent                Transfers Overall transfer level: Modified independent                  Ambulation/Gait   Gait Distance (Feet): 200 Feet Assistive device: Straight cane Gait Pattern/deviations: Antalgic     General Gait Details: still antalgic gait on RLE.   Stairs            Wheelchair Mobility    Modified Rankin (Stroke Patients Only)       Balance Overall balance  assessment: Needs assistance   Sitting balance-Leahy Scale: Good       Standing balance-Leahy Scale: Good   Single Leg Stance - Right Leg: 3 Single Leg Stance - Left Leg: 4 Tandem Stance - Right Leg: 15 Tandem Stance - Left Leg: 15 Rhomberg - Eyes Opened: 15 Rhomberg - Eyes Closed: 15 High level balance activites: Sudden stops;Head turns   Standardized Balance Assessment Standardized Balance Assessment : Dynamic Gait Index   Dynamic Gait Index Level Surface: Normal Change in Gait Speed: Mild Impairment Gait with Horizontal Head Turns: Moderate Impairment Gait with Vertical Head Turns: Moderate Impairment Gait and Pivot Turn: Normal Step Over Obstacle: Mild Impairment Step Around Obstacles: Mild Impairment Steps: Mild Impairment Total Score: 16       Pertinent Vitals/Pain Pain Assessment: No/denies pain    Home Living Family/patient expects to be discharged to:: Private residence Living Arrangements: Children Available Help at Discharge: Family;Friend(s);Personal care attendant Type of Home: House Home Access: Stairs to enter Entrance Stairs-Rails: Left Entrance Stairs-Number of Steps: 4 STE Home Layout: One level Home Equipment: Bedside commode;Grab bars - tub/shower;Cane - single point;Walker - 2 wheels      Prior Function Level of Independence: Independent with assistive device(s)         Comments: ambulates with or without SPC. independent for ADLs     Hand Dominance   Dominant Hand: Right    Extremity/Trunk Assessment   Upper Extremity Assessment Upper Extremity Assessment: Overall WFL for tasks  assessed    Lower Extremity Assessment Lower Extremity Assessment: Overall WFL for tasks assessed;RLE deficits/detail RLE Deficits / Details: R TKA 05/14/18       Communication   Communication: No difficulties  Cognition Arousal/Alertness: Awake/alert Behavior During Therapy: WFL for tasks assessed/performed Overall Cognitive Status: Within  Functional Limits for tasks assessed                                        General Comments      Exercises     Assessment/Plan    PT Assessment Patient needs continued PT services  PT Problem List Decreased strength;Decreased knowledge of use of DME;Decreased activity tolerance;Decreased balance;Decreased mobility       PT Treatment Interventions DME instruction;Balance training;Gait training;Neuromuscular re-education;Stair training;Functional mobility training;Patient/family education;Therapeutic activities;Therapeutic exercise    PT Goals (Current goals can be found in the Care Plan section)  Acute Rehab PT Goals Patient Stated Goal: Patient is eager to return home PT Goal Formulation: With patient Time For Goal Achievement: 06/24/18 Potential to Achieve Goals: Good Additional Goals Additional Goal #1: Patient will demonstrate single leg stance b/l for at least 10 seconds to decrease risk of falls.    Frequency Min 2X/week   Barriers to discharge        Co-evaluation               AM-PAC PT "6 Clicks" Daily Activity  Outcome Measure Difficulty turning over in bed (including adjusting bedclothes, sheets and blankets)?: None Difficulty moving from lying on back to sitting on the side of the bed? : None Difficulty sitting down on and standing up from a chair with arms (e.g., wheelchair, bedside commode, etc,.)?: None Help needed moving to and from a bed to chair (including a wheelchair)?: None Help needed walking in hospital room?: A Little Help needed climbing 3-5 steps with a railing? : A Little 6 Click Score: 22    End of Session Equipment Utilized During Treatment: Gait belt Activity Tolerance: Patient tolerated treatment well Patient left: in chair;with call bell/phone within reach Nurse Communication: Mobility status PT Visit Diagnosis: Unsteadiness on feet (R26.81);Other abnormalities of gait and mobility (R26.89);Muscle weakness  (generalized) (M62.81)    Time: 1050-1104 PT Time Calculation (min) (ACUTE ONLY): 14 min   Charges:   PT Evaluation $PT Eval Low Complexity: 1 Low         Olga Coaster PT, DPT 11:22 AM,06/10/18 [phone]

## 2018-06-10 NOTE — Discharge Instructions (Signed)
It was a pleasure taking care of you during your hospitalization!  You came into the hospital because you were dizzy.  We think you had an episode of vertigo.  You had an MRI of your brain that did not show a tumor or stroke.  We treated you with meclizine, which helped your symptoms.  You were also found to have a urinary tract infection.  We treated you with IV antibiotics.  I have discharged to home on Keflex, which is an antibiotic that you need to take 4 times a day for 5 more days.  -Dr. Nancy MarusMayo

## 2018-06-11 ENCOUNTER — Telehealth: Payer: Self-pay

## 2018-06-11 LAB — URINE CULTURE

## 2018-06-11 NOTE — Telephone Encounter (Signed)
Transition Care Management Follow-up Telephone Call  Date of discharge and from where: 06[facility]10/[facility]  How have you been since you were released from the hospital? States she is feeling much better. Denies any dysuria, hematuria or further issues with dizziness. States, "honey I feel wonderful".  Any questions or concerns? No   Items Reviewed:  Did the pt receive and understand the discharge instructions provided? Yes   Medications obtained and verified? Yes   Any new allergies since your discharge? No   Dietary orders reviewed? Yes  Do you have support at home? Yes   Other (ie: DME, Home Health, etc) N/A  Functional Questionnaire: (I = Independent and D = Dependent) ADL's: I  Bathing/Dressing- I   Meal Prep- I  Eating- I  Maintaining continence- I  Transferring/Ambulation- I  Managing Meds- I   Follow up appointments reviewed:    PCP Hospital f/u appt confirmed? Yes  Scheduled to see Dr. Sherie DonLada on 06/12/18 @ 10:40am.  Specialist Hospital f/u appt confirmed? N/A.  Are transportation arrangements needed? No   If their condition worsens, is the pt aware to call  their PCP or go to the ED? Yes  Was the patient provided with contact information for the PCP's office or ED? Yes  Was the pt encouraged to call back with questions or concerns? Yes

## 2018-06-12 ENCOUNTER — Ambulatory Visit
Admission: RE | Admit: 2018-06-12 | Discharge: 2018-06-12 | Disposition: A | Discharge status: Home / Self Care | Payer: 59 | Source: Ambulatory Visit | Attending: Physician Assistant | Admitting: Physician Assistant

## 2018-06-12 ENCOUNTER — Telehealth: Payer: Self-pay | Admitting: Family Medicine

## 2018-06-12 ENCOUNTER — Encounter: Payer: Self-pay | Admitting: Family Medicine

## 2018-06-12 ENCOUNTER — Other Ambulatory Visit: Payer: Self-pay | Admitting: Physician Assistant

## 2018-06-12 ENCOUNTER — Ambulatory Visit: Payer: 59 | Admitting: Family Medicine

## 2018-06-12 ENCOUNTER — Ambulatory Visit
Admission: RE | Admit: 2018-06-12 | Discharge: 2018-06-12 | Disposition: A | Discharge status: Home / Self Care | Payer: 59 | Source: Ambulatory Visit | Attending: Specialist | Admitting: Specialist

## 2018-06-12 VITALS — BP 106/56 | HR 66 | Temp 97.4°F | Resp 16 | Ht 65.0 in | Wt 143.0 lb

## 2018-06-12 DIAGNOSIS — F901 Attention-deficit hyperactivity disorder, predominantly hyperactive type: Secondary | ICD-10-CM

## 2018-06-12 DIAGNOSIS — R718 Other abnormality of red blood cells: Secondary | ICD-10-CM

## 2018-06-12 DIAGNOSIS — N3000 Acute cystitis without hematuria: Secondary | ICD-10-CM

## 2018-06-12 DIAGNOSIS — Z09 Encounter for follow-up examination after completed treatment for conditions other than malignant neoplasm: Secondary | ICD-10-CM

## 2018-06-12 DIAGNOSIS — Z789 Other specified health status: Secondary | ICD-10-CM

## 2018-06-12 DIAGNOSIS — Z7289 Other problems related to lifestyle: Secondary | ICD-10-CM

## 2018-06-12 DIAGNOSIS — F419 Anxiety disorder, unspecified: Secondary | ICD-10-CM

## 2018-06-12 DIAGNOSIS — D7589 Other specified diseases of blood and blood-forming organs: Secondary | ICD-10-CM

## 2018-06-12 DIAGNOSIS — F5101 Primary insomnia: Secondary | ICD-10-CM | POA: Diagnosis not present

## 2018-06-12 DIAGNOSIS — R600 Localized edema: Secondary | ICD-10-CM | POA: Insufficient documentation

## 2018-06-12 DIAGNOSIS — M7989 Other specified soft tissue disorders: Principal | ICD-10-CM

## 2018-06-12 DIAGNOSIS — M79661 Pain in right lower leg: Secondary | ICD-10-CM

## 2018-06-12 DIAGNOSIS — D539 Nutritional anemia, unspecified: Secondary | ICD-10-CM

## 2018-06-12 DIAGNOSIS — R937 Abnormal findings on diagnostic imaging of other parts of musculoskeletal system: Secondary | ICD-10-CM | POA: Diagnosis not present

## 2018-06-12 MED ORDER — AMLODIPINE BESYLATE 5 MG PO TABS: 5.0000 mg | ORAL_TABLET | Freq: Every day | ORAL | 3 refills | Status: DC

## 2018-06-12 MED ORDER — BUSPIRONE HCL 5 MG PO TABS: 5.0000 mg | ORAL_TABLET | Freq: Three times a day (TID) | ORAL | 0 refills | Status: DC

## 2018-06-12 NOTE — Patient Instructions (Addendum)
I've put in a referral for you to see a psychiatrist about your anxiety and ADHD and alcohol use Do NOT take any pain pills and/or Xanax and/or drink alcohol together; combining any of those can result in possible overdose and death I've put in a referral for you to see a hematologist about your large red blood cells and anemia Reduce your amlodipine to 5 mg daily Let me know if you develop sore throat  Insomnia Insomnia is a sleep disorder that makes it difficult to fall asleep or to stay asleep. Insomnia can cause tiredness (fatigue), low energy, difficulty concentrating, mood swings, and poor performance at work or school. There are three different ways to classify insomnia:  Difficulty falling asleep.  Difficulty staying asleep.  Waking up too early in the morning.  Any type of insomnia can be long-term (chronic) or short-term (acute). Both are common. Short-term insomnia usually lasts for three months or less. Chronic insomnia occurs at least three times a week for longer than three months. What are the causes? Insomnia may be caused by another condition, situation, or substance, such as:  Anxiety.  Certain medicines.  Gastroesophageal reflux disease (GERD) or other gastrointestinal conditions.  Asthma or other breathing conditions.  Restless legs syndrome, sleep apnea, or other sleep disorders.  Chronic pain.  Menopause. This may include hot flashes.  Stroke.  Abuse of alcohol, tobacco, or illegal drugs.  Depression.  Caffeine.  Neurological disorders, such as Alzheimer disease.  An overactive thyroid (hyperthyroidism).  The cause of insomnia may not be known. What increases the risk? Risk factors for insomnia include:  Gender. Women are more commonly affected than men.  Age. Insomnia is more common as you get older.  Stress. This may involve your professional or personal life.  Income. Insomnia is more common in people with lower income.  Lack of  exercise.  Irregular work schedule or night shifts.  Traveling between different time zones.  What are the signs or symptoms? If you have insomnia, trouble falling asleep or trouble staying asleep is the main symptom. This may lead to other symptoms, such as:  Feeling fatigued.  Feeling nervous about going to sleep.  Not feeling rested in the morning.  Having trouble concentrating.  Feeling irritable, anxious, or depressed.  How is this treated? Treatment for insomnia depends on the cause. If your insomnia is caused by an underlying condition, treatment will focus on addressing the condition. Treatment may also include:  Medicines to help you sleep.  Counseling or therapy.  Lifestyle adjustments.  Follow these instructions at home:  Take medicines only as directed by your health care provider.  Keep regular sleeping and waking hours. Avoid naps.  Keep a sleep diary to help you and your health care provider figure out what could be causing your insomnia. Include: ? When you sleep. ? When you wake up during the night. ? How well you sleep. ? How rested you feel the next day. ? Any side effects of medicines you are taking. ? What you eat and drink.  Make your bedroom a comfortable place where it is easy to fall asleep: ? Put up shades or special blackout curtains to block light from outside. ? Use a white noise machine to block noise. ? Keep the temperature cool.  Exercise regularly as directed by your health care provider. Avoid exercising right before bedtime.  Use relaxation techniques to manage stress. Ask your health care provider to suggest some techniques that may work well for you.  These may include: ? Breathing exercises. ? Routines to release muscle tension. ? Visualizing peaceful scenes.  Cut back on alcohol, caffeinated beverages, and cigarettes, especially close to bedtime. These can disrupt your sleep.  Do not overeat or eat spicy foods right before  bedtime. This can lead to digestive discomfort that can make it hard for you to sleep.  Limit screen use before bedtime. This includes: ? Watching TV. ? Using your smartphone, tablet, and computer.  Stick to a routine. This can help you fall asleep faster. Try to do a quiet activity, brush your teeth, and go to bed at the same time each night.  Get out of bed if you are still awake after 15 minutes of trying to sleep. Keep the lights down, but try reading or doing a quiet activity. When you feel sleepy, go back to bed.  Make sure that you drive carefully. Avoid driving if you feel very sleepy.  Keep all follow-up appointments as directed by your health care provider. This is important. Contact a health care provider if:  You are tired throughout the day or have trouble in your daily routine due to sleepiness.  You continue to have sleep problems or your sleep problems get worse. Get help right away if:  You have serious thoughts about hurting yourself or someone else. This information is not intended to replace advice given to you by your health care provider. Make sure you discuss any questions you have with your health care provider. Document Released: 10/19/2000 Document Revised: 03/23/2016 Document Reviewed: 07/23/2014 Elsevier Interactive Patient Education  [employer].

## 2018-06-12 NOTE — Progress Notes (Signed)
BP (!) 106/56 (BP Location: Left Arm, Patient Position: Sitting, Cuff Size: Normal)   Pulse 66   Temp (!) 97.4 F (36.3 C) (Oral)   Resp 16   Ht 5\' 5"  (1.651 m)   Wt 143 lb (64.9 kg)   BMI 23.80 kg/m    Subjective:    Patient ID: Ashley [name], female    DOB: 05/08/1951, 67 y.o.   MRN: [id number]  HPI: Ashley [name] is a 67 y.o. female  Chief Complaint  Patient presents with  . Osteoarthritis    HPI Patient is here for hospital follow-up. Was admitted on 8/4 for dizziness, UTI and dehydration and discharged on 06/10/2018. Patient came in due to dizziness anytime she turned her head- was treated with fluids and antibiotics and dizziness did not improve so she was admitted. Negative CT & MRI for acute abnormalities; improved with meclizine. Urine culture required change in antibiotic and was discharged with keflex 7 day course.  TOC phone call by Ammie LPN completed yesterday Patient denies sore throat; daughter has strep, just asked about it; lives with her; Friday daughter was diagnosed at [facility] negative for strep there; they gave her amoxicillin, the culture came back positive and they told her last night Patient is on seven days of keflex; started yesterday oral  States since leaving the hospital has not had any dizziness or nausea. States is still taking antibiotics, taking it as prescribed.   States was started on amlodipine 10mg  daily on 7/5- due to elevated blood pressure thought to be due anxiety of surgery. After surgery she was checking her blood pressure and systolic was 110's and so she stopped taking amlodipine and BP was well controlled. Went to the hospital for dizziness and bp was well controlled; states was anxious about leaving the hospital and noted it went high again so she started the amlodipine. Took dose today. States feels like her anxiety is really a trigger for this; takes xanax at night but would like something non-drowsy for the day time. 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  She showed me several BP readings; the 140 mmHg was after she quit taking the oxycodone  Would like to discuss vyvanse as well- States takes 60mg  once a day but was off of it for 3 months because she was unable to come in but it made it difficult to her to be productive. Dr. Odis Luster provided a short-term dose for this. Wants to take care of her daughter and her; out of work; she sounds determined to make it through this surgery and being out of work  She has macrocytic anemia; she was referred to hematologist last December but never went  She says they did a CT scan of her stomach and head and "that was perfect"; she feels much better about all that  Depression screen [facility] 2/9 05/09/2018 11/14/2017 08/06/2017 12/11/2016 09/10/2016  Decreased Interest 0 0 0 0 0  Down, Depressed, Hopeless 0 0 0 0 0  PHQ - 2 Score 0 0 0 0 0  Altered sleeping 1 - - - -  Tired, decreased energy 1 - - - -  Change in appetite 0 - - - -  Feeling bad or failure about yourself  0 - - - -  Trouble concentrating 0 - - - -  Moving slowly or fidgety/restless 0 - - - -  Suicidal thoughts 0 - - - -  PHQ-9 Score 2 - - - -  Difficult doing work/chores Somewhat difficult - - - -    Relevant past medical, surgical, family and social history reviewed Past Medical History:  Diagnosis Date  . ADHD (attention deficit hyperactivity disorder) 05/02/2015  . Anemia   . Anxiety   . Arthritis   . Attention deficit disorder   . Autoimmune liver disease   . Barrett's esophagus   . COPD (chronic obstructive pulmonary disease) (HCC)   . Depression   . Gastritis   . GERD (gastroesophageal reflux disease)   . Hypertension   . Insomnia   . Internal hemorrhoids   . Liver failure (HCC)   . Migraine   . Ovarian failure   . Peptic ulcer disease   . Small bowel obstruction [facility])    Past Surgical History:  Procedure Laterality Date  . ABDOMINAL  HYSTERECTOMY    . DILATION AND CURETTAGE OF UTERUS    . LIVER BIOPSY    . OOPHORECTOMY    . 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 Heart failure Father   . Atrial fibrillation Mother   . Cerebral palsy Brother   . Heart attack Maternal Grandfather   . CAD Paternal Grandmother   . Colon cancer Neg Hx    Social History   Tobacco Use  . Smoking status: Former Smoker    Last attempt to quit: 11/05/1988    Years since quitting: 29.6  . Smokeless tobacco: Never Used  Substance Use Topics  . Alcohol use: Yes    Alcohol/week: 7.0 standard drinks    Types: 7 Glasses of wine per week  . Drug use: No     Office Visit from 06/12/2018 in [facility]  AUDIT-C Score  0    Her last drink of wine was two weeks ago; just celebrated with a friend on a weekend Knows to not combine with pain medicine  Interim medical history since last visit reviewed. Allergies and medications reviewed  Review of Systems Per HPI unless specifically indicated above     Objective:    BP (!) 106/56 (BP Location: Left Arm, Patient Position: Sitting, Cuff Size: Normal)   Pulse 66   Temp (!) 97.4 F (36.3 C) (Oral)   Resp 16   Ht 5\' 5"  (1.651 m)   Wt 143 lb (64.9 kg)   BMI 23.80 kg/m   Wt Readings from Last 3 Encounters:  06/12/18 143 lb (64.9 kg)  06/09/18 135 lb 6.4 oz (61.4 kg)  05/14/18 143 lb (64.9 kg)    Physical Exam  Constitutional: She appears well-developed and well-nourished. No distress.  HENT:  Mouth/Throat: Mucous membranes are normal.  Eyes: EOM are normal. No scleral icterus.  Cardiovascular: Normal rate and regular rhythm.  Pulmonary/Chest: Effort normal and breath sounds normal.  Neurological: She displays tremor.  Psychiatric: Her mood appears anxious.       Assessment &  Plan:   Problem List Items Addressed This Visit      Genitourinary   UTI (urinary tract infection)    Improving clinically        Other   Macrocytosis without anemia    Referral back to hematology; patient claims she is not drinking now      Elevated MCV   Relevant Orders   Ambulatory referral to Hematology   Cannot sleep  I am not going to prescribe Xanax for this patient; encouraged healthy bedtime rituals, avoidance of electronics for 2 hours prior to onset of sleep; I can refer to psychiatrist for her overall anxiety, ADHD, but I will not prescribe Xanax      Relevant Orders   Ambulatory referral to Psychiatry   Anemia, macrocytic   Relevant Orders   Ambulatory referral to Hematology   Alcohol use    Patient reports that she is not drinking now      Relevant Orders   Ambulatory referral to Psychiatry   ADHD (attention deficit hyperactivity disorder)    Unfortunately, patient received a prescription for Vyvanse (a controlled substance) from another provider (her orthopaedist); I'm not sure why or how that transpired, but it violates our controlled substance agreement; I will not be prescribing her any controlled substances; she was upset by this; I will be happy to refer her to a psychiatrist for evaluation and consideration of medical management of ADHD      Relevant Orders   Ambulatory referral to Psychiatry    Other Visit Diagnoses    Hospital discharge follow-up    -  Primary   improving; home health, PT and OT evals for weakness following dehydration, UTI, knee replacement; meds reconciled   Anxiety       Relevant Medications   busPIRone (BUSPAR) 5 MG tablet   Other Relevant Orders   Ambulatory referral to Psychiatry       Follow up plan: Return in about 4 weeks (around 07/10/2018) for follow-up with Sharyon CableElizabeth Poulose, DNP.  An after-visit summary was printed and given to the patient at check-out.  Please see the patient instructions which may contain  other information and recommendations beyond what is mentioned above in the assessment and plan.  Meds ordered this encounter  Medications  . busPIRone (BUSPAR) 5 MG tablet    Sig: Take 1 tablet (5 mg total) by mouth 3 (three) times daily. For anxiety    Dispense:  90 tablet    Refill:  0    Order Specific Question:   Supervising Provider    Answer:   Alexande Sheerin, Janit BernMELINDA P [id number]]  . DISCONTD: amLODipine (NORVASC) 5 MG tablet    Sig: Take 1 tablet (5 mg total) by mouth daily.    Dispense:  90 tablet    Refill:  3    Changing dose    Orders Placed This Encounter  Procedures  . Ambulatory referral to Psychiatry  . Ambulatory referral to Hematology

## 2018-06-12 NOTE — Telephone Encounter (Signed)
Copied from CRM [phone]. Topic: General - Other >> Jun 12, 2018  8:10 AM Debroah LoopLander, Lumin L wrote: Reason for CRM: Robin[facility] OT with frequency 1xwk 1, 2x a wk for 1wk, 1x a wk for 2wks for Adls and IADL skills. Patient on office for appt today 06/12/2018.

## 2018-06-12 NOTE — Telephone Encounter (Signed)
Is this OT and PT for her knee status post surgery OR for her urinary tract infection? What is the principal diagnosis and who ordered this?

## 2018-06-13 NOTE — Telephone Encounter (Signed)
The orders are from the hospital for OT and PT for knees and UTI. Zella BallRobin said that she has done the initial evaluation and needs orders from PCP because the hospital can't do orders.

## 2018-06-13 NOTE — Telephone Encounter (Signed)
If associated with her knees, please ask her orthopaedist sign

## 2018-06-13 NOTE — Telephone Encounter (Signed)
She needs orders for the UTI as well. Weakness is due to the fact that she had UTI.

## 2018-06-13 NOTE — Telephone Encounter (Signed)
I called to speak with home health agent Most of the issues are related to anxiety and lack of ability to do things She is so anxious about everything; no confidence Confidence for IADLS and ADLs, approved and spoke with Zella Ballobin

## 2018-06-19 ENCOUNTER — Telehealth: Payer: Self-pay

## 2018-06-19 MED ORDER — LOSARTAN POTASSIUM 50 MG PO TABS: 100.0000 mg | ORAL_TABLET | Freq: Every day | ORAL | Status: DC

## 2018-06-19 NOTE — Telephone Encounter (Signed)
Copied from CRM [phone]. Topic: Quick Communication - See Telephone Encounter >> Jun 19, 2018  9:39 AM Waymon AmatoBurton, Donna F wrote: [facility] is calling to let the provider know that feet have swelling plus 1-believe it is a side effect of norvasc She did not have this until she started taking the norvasc again last week and swelling started Thursday -she is wanting to stop the medication and [facility] can not be the one to say to  Stop this  Blood pressure today is 131/71  Best number [phone]

## 2018-06-19 NOTE — Telephone Encounter (Signed)
Patient notified

## 2018-06-19 NOTE — Telephone Encounter (Signed)
I agree that amlodipine is a common cause of leg edema and is a likely culprit Have patient STOP the amlodipine Increase the losartan from 50 mg daily to 100 mg daily (take two of the 50 mg pills to equal 100 mg) Have them recheck BP in 7 days and draw a BMP at that time Thank you

## 2018-06-20 ENCOUNTER — Other Ambulatory Visit: Payer: Self-pay | Admitting: Family Medicine

## 2018-06-20 DIAGNOSIS — R718 Other abnormality of red blood cells: Secondary | ICD-10-CM | POA: Insufficient documentation

## 2018-06-20 DIAGNOSIS — D539 Nutritional anemia, unspecified: Secondary | ICD-10-CM | POA: Insufficient documentation

## 2018-06-20 MED ORDER — LOSARTAN POTASSIUM 100 MG PO TABS: 100.0000 mg | ORAL_TABLET | Freq: Every day | ORAL | 0 refills | Status: DC

## 2018-06-20 NOTE — Assessment & Plan Note (Signed)
Improving clinically

## 2018-06-20 NOTE — Assessment & Plan Note (Signed)
Patient reports that she is not drinking now

## 2018-06-20 NOTE — Assessment & Plan Note (Signed)
Referral back to hematology; patient claims she is not drinking now

## 2018-06-20 NOTE — Assessment & Plan Note (Signed)
I am not going to prescribe Xanax for this patient; encouraged healthy bedtime rituals, avoidance of electronics for 2 hours prior to onset of sleep; I can refer to psychiatrist for her overall anxiety, ADHD, but I will not prescribe Xanax

## 2018-06-20 NOTE — Assessment & Plan Note (Signed)
Unfortunately, patient received a prescription for Vyvanse (a controlled substance) from another provider (her orthopaedist); I'm not sure why or how that transpired, but it violates our controlled substance agreement; I will not be prescribing her any controlled substances; she was upset by this; I will be happy to refer her to a psychiatrist for evaluation and consideration of medical management of ADHD

## 2018-06-21 ENCOUNTER — Other Ambulatory Visit: Payer: Self-pay | Admitting: Family Medicine

## 2018-06-26 ENCOUNTER — Ambulatory Visit: Payer: 59

## 2018-06-26 ENCOUNTER — Telehealth: Payer: Self-pay

## 2018-06-26 VITALS — BP 152/94 | HR 71

## 2018-06-26 DIAGNOSIS — I10 Essential (primary) hypertension: Secondary | ICD-10-CM

## 2018-06-26 MED ORDER — HYDRALAZINE HCL 10 MG PO TABS: 10.0000 mg | ORAL_TABLET | Freq: Three times a day (TID) | ORAL | 2 refills | Status: DC

## 2018-06-26 NOTE — Patient Instructions (Signed)
We will add another medication called hydralazine10mg  three times a day and recheck blood pressure in 10 days

## 2018-06-26 NOTE — Telephone Encounter (Signed)
Send in hydralazine

## 2018-06-27 ENCOUNTER — Telehealth: Payer: Self-pay | Admitting: Family Medicine

## 2018-06-27 DIAGNOSIS — N3000 Acute cystitis without hematuria: Secondary | ICD-10-CM

## 2018-06-27 LAB — BASIC METABOLIC PANEL WITH GFR
BUN: 11 mg/dL (ref 7–25)
CALCIUM: 9.4 mg/dL (ref 8.6–10.4)
CHLORIDE: 105 mmol/L (ref 98–110)
CO2: 24 mmol/L (ref 20–32)
CREATININE: 0.82 mg/dL (ref 0.50–0.99)
GFR, Est African American: 87 mL/min/{1.73_m2} (ref >=60)
GFR, Est Non African American: 75 mL/min/{1.73_m2} (ref >=60)
GLUCOSE: 104 mg/dL — AB (ref 65–99)
Potassium: 3.9 mmol/L (ref 3.5–5.3)
Sodium: 142 mmol/L (ref 135–146)

## 2018-06-27 NOTE — Telephone Encounter (Signed)
Copied from CRM [phone]. Topic: General - Other >> Jun 27, 2018  9:26 AM Tamela OddiHarris, Whitney Hillegass J wrote: Reason for CRM: Sandria [facility] called to request orders for labs for patient in order to see if patient still has a UTI.  They can do the labs in order to save patient a trip to the office if it's approved by the doctor.  Please advise.  CB#[phone].

## 2018-06-27 NOTE — Telephone Encounter (Signed)
That's fine Urine for dip, micro plus reflex culture Dx: UTI

## 2018-07-02 ENCOUNTER — Telehealth: Payer: Self-pay

## 2018-07-02 DIAGNOSIS — N3000 Acute cystitis without hematuria: Secondary | ICD-10-CM

## 2018-07-02 NOTE — Telephone Encounter (Signed)
Copied from CRM [id number]. Topic: Referral - Request >> Jul 02, 2018  2:40 PM Waymon AmatoBurton, Donna F wrote: Pt is needing a referral to [facility] [place] [facility] [phone]    Best number [phone]

## 2018-07-08 ENCOUNTER — Other Ambulatory Visit: Payer: Self-pay | Admitting: Family Medicine

## 2018-07-10 ENCOUNTER — Ambulatory Visit: Payer: 59 | Admitting: Nurse Practitioner

## 2018-07-11 ENCOUNTER — Other Ambulatory Visit: Payer: Self-pay

## 2018-07-11 ENCOUNTER — Encounter: Payer: Self-pay | Admitting: Emergency Medicine

## 2018-07-11 ENCOUNTER — Ambulatory Visit (INDEPENDENT_AMBULATORY_CARE_PROVIDER_SITE_OTHER): Payer: 59

## 2018-07-11 ENCOUNTER — Ambulatory Visit
Admission: EM | Admit: 2018-07-11 | Discharge: 2018-07-11 | Disposition: A | Discharge status: Home / Self Care | Payer: 59

## 2018-07-11 ENCOUNTER — Other Ambulatory Visit: Payer: Self-pay | Admitting: Nurse Practitioner

## 2018-07-11 DIAGNOSIS — J189 Pneumonia, unspecified organism: Secondary | ICD-10-CM | POA: Diagnosis not present

## 2018-07-11 DIAGNOSIS — S2242XA Multiple fractures of ribs, left side, initial encounter for closed fracture: Secondary | ICD-10-CM

## 2018-07-11 DIAGNOSIS — R0789 Other chest pain: Secondary | ICD-10-CM

## 2018-07-11 DIAGNOSIS — W19XXXA Unspecified fall, initial encounter: Secondary | ICD-10-CM | POA: Diagnosis not present

## 2018-07-11 MED ORDER — OXYCODONE HCL 5 MG PO TABS: 5.0000 mg | ORAL_TABLET | Freq: Four times a day (QID) | ORAL | 0 refills | Status: AC | PRN

## 2018-07-11 MED ORDER — KETOROLAC TROMETHAMINE 60 MG/2ML IM SOLN
60.0000 mg | Freq: Once | INTRAMUSCULAR | Status: AC
  Administered 2018-07-11: 60 mg via INTRAMUSCULAR

## 2018-07-11 MED ORDER — HYDROCODONE-ACETAMINOPHEN 5-325 MG PO TABS: 1.0000 | ORAL_TABLET | Freq: Four times a day (QID) | ORAL | 0 refills | Status: AC | PRN

## 2018-07-11 MED ORDER — LEVOFLOXACIN 750 MG PO TABS: 750.0000 mg | ORAL_TABLET | Freq: Every day | ORAL | 0 refills | Status: AC

## 2018-07-11 NOTE — Discharge Instructions (Addendum)
X-rays indicate fractures of ribs 7 and 8. Explained that rib fractures will heal in several weeks on their own.Treating with pain relief medications. Ice the areas that hurt and make sure to do deep breathing exercises. May continue lidocaine patches. F/u in 3-4 weeks for repeat imaging or sooner if breathing becomes labored or short of breath.  X-rays also indicate possible early pneumonia so will cover you with 5 day course of Levaquin antibiotic.  F/u with urgent care or PCP in 7-10 days for re-examination or sooner if fever, cough, breathing issues or any signs of pneumonia.

## 2018-07-11 NOTE — ED Triage Notes (Signed)
Patient in today after sustaining a fall on 07/05/18. Patient c/o left flank/back pain.

## 2018-07-11 NOTE — ED Provider Notes (Signed)
[facility]    CSN: [id number] Arrival date & time: 07/11/18  1133     History   Chief Complaint Chief Complaint  Patient presents with  . Fall    DOI 07/05/18  . Flank Pain    HPI Ashley [name] is a 67 y.o. female. Patient presents for left lateral sharp rib pain. States he fell onto her left side 6 days ago and the pain has not improved. Admits to severe aching pain of 7/10. Pain worse with cough and deep breathing. Denies breathing difficulty or SOB. Denies back or hip pain. Denies other injuries. Is taking Ultram and using OTC lidocaine patches without much relief of pain.  HPI  Past Medical History:  Diagnosis Date  . ADHD (attention deficit hyperactivity disorder) 05/02/2015  . Anemia   . Anxiety   . Arthritis   . Attention deficit disorder   . Autoimmune liver disease   . Barrett's esophagus   . COPD (chronic obstructive pulmonary disease) (HCC)   . Depression   . Gastritis   . GERD (gastroesophageal reflux disease)   . Hypertension   . Insomnia   . Internal hemorrhoids   . Liver failure (HCC)   . Migraine   . Ovarian failure   . Peptic ulcer disease   . Small bowel obstruction [facility])     Patient Active Problem List   Diagnosis Date Noted  . Anemia, macrocytic 06/20/2018  . Elevated MCV 06/20/2018  . Dizziness 06/08/2018  . UTI (urinary tract infection) 06/08/2018  . Status post right knee replacement 05/14/2018  . Alcohol use 05/12/2018  . Vitamin B12 deficiency 05/12/2018  . Infection due to Staphylococcus aureus 05/12/2018  . Macrocytosis without anemia 09/10/2016  . Hand cramps 09/10/2016  . Medication monitoring encounter 09/10/2016  . Controlled substance agreement signed 04/10/2016  . Allergic rhinitis 09/19/2015  . Arthritis of knee, degenerative 09/19/2015  . ADHD (attention deficit hyperactivity disorder) 05/02/2015  . Special screening for malignant neoplasms, colon 02/02/2014  . CONSTIPATION 04/22/2008  . INTERNAL  HEMORRHOIDS 04/21/2008  . COPD (chronic obstructive pulmonary disease) (HCC) 04/21/2008  . BARRETTS ESOPHAGUS 04/21/2008  . DEPRESSION, HX OF 04/21/2008  . PEPTIC ULCER DISEASE, HX OF 04/21/2008  . SMALL BOWEL OBSTRUCTION, HX OF 04/21/2008  . Benign essential HTN 01/26/2008  . Cannot sleep 01/26/2008  . Migraine with aura 01/26/2008    Past Surgical History:  Procedure Laterality Date  . ABDOMINAL HYSTERECTOMY    . DILATION AND CURETTAGE OF UTERUS    . LIVER BIOPSY    . OOPHORECTOMY    . TOTAL KNEE ARTHROPLASTY Right 05/14/2018   Procedure: TOTAL KNEE ARTHROPLASTY;  Surgeon: Lyndle Herrlich, MD;  Location: [facility] ORS;  Service: Orthopedics;  Laterality: Right;    OB History   None      Home Medications    Prior to Admission medications   Medication Sig Start Date End Date Taking? Authorizing Provider  ALPRAZolam Prudy Feeler) 1 MG tablet Take 1 tablet (1 mg total) by mouth at bedtime as needed for anxiety. 06/10/18  Yes Mayo, Allyn Kenner, MD  aspirin 81 MG chewable tablet Chew 1 tablet (81 mg total) by mouth daily. 06/10/18  Yes Mayo, Allyn Kenner, MD  atenolol (TENORMIN) 100 MG tablet TAKE 1 TABLET BY MOUTH TWICE DAILY 09/30/17  Yes Lada, Janit Bern, MD  Cholecalciferol (VITAMIN D3) 5000 units CAPS Take 1 capsule by mouth daily.   Yes [provider]  Cyanocobalamin (VITAMIN B 12 PO) Take 3,000 mcg  by mouth daily.    Yes [provider]  docusate sodium (COLACE) 100 MG capsule Take 1 capsule (100 mg total) by mouth 2 (two) times daily. 05/16/18  Yes Lyndle Herrlich, MD  hydrALAZINE (APRESOLINE) 10 MG tablet Take 1 tablet (10 mg total) by mouth 3 (three) times daily. 06/26/18  Yes Lada, Janit Bern, MD  losartan (COZAAR) 100 MG tablet TAKE ONE TABLET BY MOUTH EVERY DAY 07/09/18  Yes Poulose, Percell Belt, NP  meclizine (ANTIVERT) 12.5 MG tablet Take 1 tablet (12.5 mg total) by mouth 3 (three) times daily as needed for dizziness. 06/10/18  Yes Mayo, Allyn Kenner, MD  omeprazole (PRILOSEC)  20 MG capsule Take 1 capsule (20 mg total) by mouth daily. 05/12/18  Yes Lada, Janit Bern, MD  SUMAtriptan (IMITREX) 20 MG/ACT nasal spray Place 1 spray (20 mg total) into the nose once for 1 dose. May repeat in 2 hours if headache persists or recurs. 11/14/17 07/11/18 Yes Lada, Janit Bern, MD  traMADol (ULTRAM) 50 MG tablet Take 50 mg by mouth every 6 (six) hours as needed.   Yes [provider]  busPIRone (BUSPAR) 5 MG tablet Take 1 tablet (5 mg total) by mouth 3 (three) times daily. For anxiety 06/12/18   Kerman Passey, MD  HYDROcodone-acetaminophen (NORCO/VICODIN) 5-325 MG tablet Take 1 tablet by mouth every 6 (six) hours as needed for up to 5 days. 07/11/18 07/16/18  Shirlee Latch, PA-C  levofloxacin (LEVAQUIN) 750 MG tablet Take 1 tablet (750 mg total) by mouth daily for 5 days. 07/11/18 07/16/18  Shirlee Latch, PA-C  lisdexamfetamine (VYVANSE) 30 MG capsule Take 1 capsule (30 mg total) by mouth daily. 05/17/18   Lyndle Herrlich, MD  loratadine (CLARITIN) 10 MG tablet Take 10 mg by mouth daily as needed for allergies.     [provider]  ondansetron (ZOFRAN ODT) 4 MG disintegrating tablet Take 1 tablet (4 mg total) by mouth every 6 (six) hours as needed for nausea or vomiting. 06/10/18   Mayo, Allyn Kenner, MD  oxyCODONE (ROXICODONE) 5 MG immediate release tablet Take 1 tablet (5 mg total) by mouth every 6 (six) hours as needed for up to 5 days for severe pain. 07/11/18 07/16/18  Eusebio Friendly B, PA-C  SUMAtriptan (IMITREX) 100 MG tablet Take 1 tablet by mouth every 8 (eight) hours as needed.    [provider]    Family History Family History  Problem Relation Age of Onset  . Heart failure Father   . Atrial fibrillation Mother   . Cerebral palsy Brother   . Heart attack Maternal Grandfather   . CAD Paternal Grandmother   . Colon cancer Neg Hx     Social History Social History   Tobacco Use  . Smoking status: Former Smoker    Last attempt to quit: 11/05/1988    Years since  quitting: 29.7  . Smokeless tobacco: Never Used  Substance Use Topics  . Alcohol use: Yes    Alcohol/week: 7.0 standard drinks    Types: 7 Glasses of wine per week  . Drug use: No     Allergies   Topamax [topiramate] and Tylenol [acetaminophen]   Review of Systems Review of Systems  Constitutional: Negative for fatigue and fever.  HENT: Negative for congestion.   Eyes: Negative for visual disturbance.  Respiratory: Negative for cough, chest tightness, shortness of breath and wheezing.   Cardiovascular: Negative for chest pain and palpitations.  Gastrointestinal: Negative for abdominal pain, nausea  and vomiting.  Genitourinary: Negative for dysuria, flank pain, frequency and hematuria.  Musculoskeletal: Positive for arthralgias (left ribs). Negative for back pain and joint swelling.  Skin: Negative for color change and rash.  Neurological: Negative for dizziness, syncope, weakness, numbness and headaches.  Hematological: Does not bruise/bleed easily.  Psychiatric/Behavioral: Negative for confusion. The patient is not nervous/anxious.      Physical Exam Triage Vital Signs ED Triage Vitals  Enc Vitals Group     BP 07/11/18 1148 106/61     Pulse Rate 07/11/18 1148 (!) 57     Resp 07/11/18 1148 16     Temp 07/11/18 1148 (!) 97.5 F (36.4 C)     Temp Source 07/11/18 1148 Oral     SpO2 07/11/18 1148 100 %     Weight 07/11/18 1150 148 lb (67.1 kg)     Height 07/11/18 1150 5\' 5"  (1.651 m)     Head Circumference --      Peak Flow --      Pain Score 07/11/18 1148 8     Pain Loc --      Pain Edu? --      Excl. in GC? --    No data found.  Updated Vital Signs BP 106/61 (BP Location: Left Arm)   Pulse (!) 57   Temp (!) 97.5 F (36.4 C) (Oral)   Resp 16   Ht 5\' 5"  (1.651 m)   Wt 148 lb (67.1 kg)   SpO2 100%   BMI 24.63 kg/m   Visual Acuity Right Eye Distance:   Left Eye Distance:   Bilateral Distance:    Right Eye Near:   Left Eye Near:    Bilateral Near:      Physical Exam  Constitutional: She is oriented to person, place, and time. She appears well-developed and well-nourished. No distress.  HENT:  Head: Normocephalic and atraumatic.  Mouth/Throat: Oropharynx is clear and moist.  Eyes: Pupils are equal, round, and reactive to light. Conjunctivae and EOM are normal. Right eye exhibits no discharge. Left eye exhibits no discharge. No scleral icterus.  Neck: Normal range of motion. Neck supple.  Cardiovascular: Normal rate, regular rhythm, normal heart sounds and intact distal pulses.  No murmur heard. Pulmonary/Chest: Effort normal and breath sounds normal. No respiratory distress. She has no wheezes. She has no rales. She exhibits tenderness (TTP of lateral left ribs ).  Abdominal: Soft. There is no tenderness. There is no rebound and no guarding.  Musculoskeletal: Normal range of motion. She exhibits no deformity.  No TTP of c-spine, t-spine, or l-spine. No tenderness of paravertebral muscles. No TTP of left hip or upper extremity. Full ROM of left upper and lower extremities and back.  Neurological: She is alert and oriented to person, place, and time. She displays normal reflexes. No cranial nerve deficit.  Skin: Skin is warm and dry. She is not diaphoretic. No erythema.  Psychiatric: She has a normal mood and affect. Her behavior is normal.  Nursing note and vitals reviewed.    UC Treatments / Results  Labs (all labs ordered are listed, but only abnormal results are displayed) Labs Reviewed - No data to display  EKG None  Radiology Dg Ribs Unilateral W/chest Left  Result Date: 07/11/2018 CLINICAL DATA:  Larey Seat onto the left side 1 week ago. Pleuritic chest pain and post test of discomfort. History of knee replacement in July 2019. EXAM: LEFT RIBS AND CHEST - 3+ VIEW COMPARISON:  PA and lateral chest x-ray  of May 16, 2018 FINDINGS: The lungs are mildly hyperinflated. There is mild bibasilar linear increased density. There is no pleural  effusion pneumothorax the heart and pulmonary vascularity are normal. There is gentle curvature convex toward the right centered at approximately T11. Left rib detail images reveal fractures of the lateral aspects of the seventh and eighth ribs. IMPRESSION: Mildly displaced fractures of the lateral aspects of the left seventh and eighth ribs. No pneumothorax or significant pleural effusion. Bibasilar atelectasis or early pneumonia. Underlying COPD-smoking related changes. Followup PA and lateral chest X-ray is recommended in 3-4 weeks following trial of antibiotic therapy to ensure resolution and exclude underlying malignancy. Thoracic aortic atherosclerosis. Electronically Signed   By: David  [place] M.D.   On: 07/11/2018 13:35    Procedures Procedures (including critical care time)  Medications Ordered in UC Medications  ketorolac (TORADOL) injection 60 mg (60 mg Intramuscular Given 07/11/18 1315)    Initial Impression / Assessment and Plan / UC Course  I have reviewed the triage vital signs and the nursing notes.  Pertinent labs & imaging results that were available during my care of the patient were reviewed by me and considered in my medical decision making (see chart for details).   X-rays indicate fractures of ribs 7 and 8. Treating patient with pain relief medications. Ice the areas that hurt and make sure to do deep breathing exercises. May continue lidocaine patches. F/u in 3-4 weeks for repeat imaging or sooner if breathing becomes labored or short of breath.  X-rays also indicate possible early pneumonia so will cover patient with 5 day course of Levaquin. F/u with urgent care or PCP in 7-10 days for re-examination or sooner if fever, cough, breathing issues or any signs of pneumonia.   Note: Oxycodone sent due to tylenol "allergy," then patient requested Norco. Canceled OXY with pharmacy and sent Norco.  Final Clinical Impressions(s) / UC Diagnoses   Final diagnoses:  Closed  fracture of multiple ribs of left side, initial encounter  Other chest pain  Community acquired pneumonia, unspecified laterality     Discharge Instructions      X-rays indicate fractures of ribs 7 and 8. Treating patient with pain relief medications. Ice the areas that hurt and make sure to do deep breathing exercises. May continue lidocaine patches. F/u in 3-4 weeks for repeat imaging or sooner if breathing becomes labored or short of breath.  X-rays also indicate possible early pneumonia so will cover patient with 5 day course of Levaquin. F/u with urgent care or PCP in 7-10 days for re-examination or sooner if fever, cough, breathing issues or any signs of pneumonia.    ED Prescriptions    Medication Sig Dispense Auth. Provider   levofloxacin (LEVAQUIN) 750 MG tablet Take 1 tablet (750 mg total) by mouth daily for 5 days. 5 tablet Eusebio Friendly B, PA-C   oxyCODONE (ROXICODONE) 5 MG immediate release tablet Take 1 tablet (5 mg total) by mouth every 6 (six) hours as needed for up to 5 days for severe pain. 30 tablet Shirlee Latch, PA-C   HYDROcodone-acetaminophen (NORCO/VICODIN) 5-325 MG tablet Take 1 tablet by mouth every 6 (six) hours as needed for up to 5 days. 20 tablet Shirlee Latch, PA-C     Controlled Substance Prescriptions [place] Controlled Substance Registry consulted? Yes, I have consulted the [place] Controlled Substances Registry for this patient, and feel the risk/benefit ratio today is favorable for proceeding with this prescription for a controlled substance.   Michiel Cowboy,  Algis Greenhouse, PA-C 07/12/18 1759

## 2018-07-13 NOTE — ED Provider Notes (Signed)
EKG June 08, 2018 reviewed by me at 1440 Heart rate 70 QRS 90 QTc 460 Normal sinus rhythm without ischemia   Sharyn Creamer, MD 07/13/18 [phone]

## 2018-08-06 ENCOUNTER — Ambulatory Visit: Payer: 59 | Admitting: Urology

## 2018-08-06 ENCOUNTER — Telehealth: Payer: Self-pay | Admitting: Nurse Practitioner

## 2018-08-06 ENCOUNTER — Encounter: Payer: Self-pay | Admitting: Urology

## 2018-08-06 NOTE — Telephone Encounter (Signed)
Patient was seen in the ER in early September and was supposed to f/u for that Please have her schedule ER follow-up appointment ASAP

## 2018-08-07 ENCOUNTER — Ambulatory Visit: Payer: 59 | Admitting: Family Medicine

## 2018-08-07 NOTE — Telephone Encounter (Signed)
Pt thanks you for calling in the medication because she is out however, she will not be scheduling the ER follow up appt. Pt have new patient appt with [facility] on Monday

## 2018-08-11 ENCOUNTER — Ambulatory Visit: Payer: 59 | Admitting: Family Medicine

## 2018-09-02 ENCOUNTER — Other Ambulatory Visit: Payer: Self-pay | Admitting: Family Medicine

## 2018-09-12 ENCOUNTER — Ambulatory Visit (INDEPENDENT_AMBULATORY_CARE_PROVIDER_SITE_OTHER): Payer: 59 | Admitting: Family Medicine

## 2018-09-12 ENCOUNTER — Encounter: Payer: Self-pay | Admitting: Family Medicine

## 2018-09-12 ENCOUNTER — Encounter

## 2018-09-12 VITALS — BP 188/100 | HR 58 | Temp 97.9°F | Ht 64.5 in | Wt 144.4 lb

## 2018-09-12 DIAGNOSIS — Z87440 Personal history of urinary (tract) infections: Secondary | ICD-10-CM | POA: Diagnosis not present

## 2018-09-12 DIAGNOSIS — G43101 Migraine with aura, not intractable, with status migrainosus: Secondary | ICD-10-CM

## 2018-09-12 DIAGNOSIS — I10 Essential (primary) hypertension: Secondary | ICD-10-CM | POA: Diagnosis not present

## 2018-09-12 DIAGNOSIS — Z13220 Encounter for screening for lipoid disorders: Secondary | ICD-10-CM

## 2018-09-12 DIAGNOSIS — F901 Attention-deficit hyperactivity disorder, predominantly hyperactive type: Secondary | ICD-10-CM

## 2018-09-12 DIAGNOSIS — E538 Deficiency of other specified B group vitamins: Secondary | ICD-10-CM

## 2018-09-12 DIAGNOSIS — F5101 Primary insomnia: Secondary | ICD-10-CM

## 2018-09-12 LAB — UA/M W/RFLX CULTURE, ROUTINE
BILIRUBIN UA: NEGATIVE
GLUCOSE, UA: NEGATIVE
Leukocytes, UA: NEGATIVE
Nitrite, UA: NEGATIVE
PH UA: 5 (ref 5.0–7.5)
PROTEIN UA: NEGATIVE
RBC UA: NEGATIVE
SPEC GRAV UA: 1.02 (ref 1.005–1.030)
UUROB: 0.2 mg/dL (ref 0.2–1.0)

## 2018-09-12 LAB — MICROSCOPIC EXAMINATION
BACTERIA UA: NONE SEEN
RBC, UA: NONE SEEN /hpf (ref 0–2)

## 2018-09-12 LAB — MICROALBUMIN, URINE WAIVED
CREATININE, URINE WAIVED: 300 mg/dL (ref 10–300)
MICROALB, UR WAIVED: 80 mg/L — AB (ref 0–19)

## 2018-09-12 MED ORDER — HYDRALAZINE HCL 25 MG PO TABS: 25.0000 mg | ORAL_TABLET | Freq: Three times a day (TID) | ORAL | 3 refills | Status: DC

## 2018-09-12 MED ORDER — ATENOLOL 100 MG PO TABS: 100.0000 mg | ORAL_TABLET | Freq: Two times a day (BID) | ORAL | 1 refills | Status: DC

## 2018-09-12 MED ORDER — SUMATRIPTAN 20 MG/ACT NA SOLN: 20.0000 mg | Freq: Once | NASAL | 3 refills | Status: DC

## 2018-09-12 MED ORDER — BUSPIRONE HCL 5 MG PO TABS: 5.0000 mg | ORAL_TABLET | Freq: Three times a day (TID) | ORAL | 0 refills | Status: DC

## 2018-09-12 MED ORDER — LOSARTAN POTASSIUM 100 MG PO TABS: 100.0000 mg | ORAL_TABLET | Freq: Every day | ORAL | 1 refills | Status: DC

## 2018-09-12 MED ORDER — SUMATRIPTAN SUCCINATE 100 MG PO TABS: 100.0000 mg | ORAL_TABLET | Freq: Three times a day (TID) | ORAL | 12 refills | Status: DC | PRN

## 2018-09-12 NOTE — Assessment & Plan Note (Signed)
Checking labs today. Await results. Call with any concerns.  

## 2018-09-12 NOTE — Assessment & Plan Note (Signed)
BP high today. Discussed that we do not prescribe controlled substances on the first visit and that we cannot prescribe this for her with the blood pressure being so high. Recheck 2 weeks, if BP better, can restart vyvanse.

## 2018-09-12 NOTE — Assessment & Plan Note (Signed)
Not under good control. Will increase her hydalazine to 25mg  TID and recheck 2 weeks. Call with any concerns. Labs checked today.

## 2018-09-12 NOTE — Assessment & Plan Note (Signed)
Wakes feeling refreshed. No need for medication at this time. If this changes, can consider something.

## 2018-09-12 NOTE — Progress Notes (Signed)
BP (!) 188/100 (BP Location: Left Arm, Patient Position: Sitting, Cuff Size: Normal) Comment: Manually  Pulse (!) 58   Temp 97.9 F (36.6 C) (Oral)   Ht 5' 4.5" (1.638 m)   Wt 144 lb 6.4 oz (65.5 kg)   SpO2 100%   BMI 24.40 kg/m    Subjective:    Patient ID: Ashley [name], female    DOB: June 13, 1951, 67 y.o.   MRN: [id number]  HPI: Ashley [name] is a 67 y.o. female who presents today to establish care  Chief Complaint  Patient presents with  . Establish Care  . Insomnia  . ADHD  . Medication Refill    Losartan   HYPERTENSION- has been taking her medicine except the middle dose of the hydralzine Hypertension status: uncontrolled  Satisfied with current treatment? no Duration of hypertension: chronic BP monitoring frequency:  not checking BP medication side effects:  no Medication compliance: excellent compliance Previous BP meds: hydralazine, losartan, atenolol Aspirin: no Recurrent headaches: no Visual changes: no Palpitations: no Dyspnea: no Chest pain: no Lower extremity edema: no Dizzy/lightheaded: no   Migraines has been good- has not had any problems. Has been doing well with imitrix. Gets a headache every 1-2 months.   ADHD FOLLOW UP ADHD status: stable- has been off of it  Satisfied with current therapy: no Medication compliance:  fair compliance Controlled substance contract: no Previous psychiatry evaluation: no Previous medications: yes vyanse   Taking meds on weekends/vacations: yes Work/school performance:  excellent Difficulty sustaining attention/completing tasks: no Distracted by extraneous stimuli: no Does not listen when spoken to: no  Fidgets with hands or feet: no Unable to stay in seat: no Blurts out/interrupts others: no ADHD Medication Side Effects: no    Decreased appetite: no    Headache: no    Sleeping disturbance pattern: no    Irritability: no    Rebound effects (worse than baseline) off medication: no    Anxiousness:  no    Dizziness: no    Tics: no  INSOMNIA Duration: chronic Satisfied with sleep quality: yes Difficulty falling asleep: no Difficulty staying asleep: yes Waking a few hours after sleep onset: yes Early morning awakenings: yes Daytime hypersomnolence: no Wakes feeling refreshed: yes Good sleep hygiene: yes Apnea: no Snoring: no Depressed/anxious mood: no Recent stress: no Restless legs/nocturnal leg cramps: no Chronic pain/arthritis: no History of sleep study: no Treatments attempted: xanax and ambien    Active Ambulatory Problems    Diagnosis Date Noted  . INTERNAL HEMORRHOIDS 04/21/2008  . COPD (chronic obstructive pulmonary disease) (HCC) 04/21/2008  . BARRETTS ESOPHAGUS 04/21/2008  . ADHD (attention deficit hyperactivity disorder) 05/02/2015  . Allergic rhinitis 09/19/2015  . Benign essential HTN 01/26/2008  . Cannot sleep 01/26/2008  . Migraine with aura 01/26/2008  . Arthritis of knee, degenerative 09/19/2015  . Controlled substance agreement signed 04/10/2016  . Alcohol use 05/12/2018  . Vitamin B12 deficiency 05/12/2018  . Anemia, macrocytic 06/20/2018   Resolved Ambulatory Problems    Diagnosis Date Noted  . Migraine 04/21/2008  . Essential hypertension 04/21/2008  . GERD 04/21/2008  . CONSTIPATION 04/22/2008  . HEPATIC FAILURE 04/21/2008  . DEPRESSION, HX OF 04/21/2008  . ANEMIA, HX OF 04/21/2008  . PEPTIC ULCER DISEASE, HX OF 04/21/2008  . SMALL BOWEL OBSTRUCTION, HX OF 04/21/2008  . Special screening for malignant neoplasms, colon 02/02/2014  . LFTs abnormal 02/02/2014  . GERD (gastroesophageal reflux disease) 02/02/2014  . ADD (attention deficit hyperactivity disorder, inattentive type) 03/16/2010  .  Chronic kidney disease (CKD), stage III (moderate) (HCC) 09/19/2015  . Chronic airway obstruction (HCC) 09/19/2015  . Macrocytosis without anemia 09/10/2016  . Hand cramps 09/10/2016  . Medication monitoring encounter 09/10/2016  . Status post  right knee replacement 05/14/2018  . Dizziness 06/08/2018  . UTI (urinary tract infection) 06/08/2018  . Infection due to Staphylococcus aureus 05/12/2018  . Elevated MCV 06/20/2018   Past Medical History:  Diagnosis Date  . Anemia   . Anxiety   . Arthritis   . Attention deficit disorder   . Autoimmune liver disease   . Depression   . Gastritis   . Hypertension   . Insomnia   . Internal hemorrhoids   . Liver failure (HCC)   . Ovarian failure   . Peptic ulcer disease   . Small bowel obstruction [facility])    Past Surgical History:  Procedure Laterality Date  . ABDOMINAL HYSTERECTOMY    . DILATION AND CURETTAGE OF UTERUS    . LIVER BIOPSY    . OOPHORECTOMY    . TOTAL KNEE ARTHROPLASTY Right 05/14/2018   Procedure: TOTAL KNEE ARTHROPLASTY;  Surgeon: Lyndle Herrlich, MD;  Location: [facility] ORS;  Service: Orthopedics;  Laterality: Right;   Outpatient Encounter Medications as of 09/12/2018  Medication Sig  . atenolol (TENORMIN) 100 MG tablet Take 1 tablet (100 mg total) by mouth 2 (two) times daily.  . busPIRone (BUSPAR) 5 MG tablet Take 1 tablet (5 mg total) by mouth 3 (three) times daily. For anxiety  . Cholecalciferol (VITAMIN D3) 5000 units CAPS Take 1 capsule by mouth daily.  . Cyanocobalamin (VITAMIN B 12 PO) Take 3,000 mcg by mouth daily.   . hydrALAZINE (APRESOLINE) 25 MG tablet Take 1 tablet (25 mg total) by mouth 3 (three) times daily.  Marland Kitchen loratadine (CLARITIN) 10 MG tablet Take 10 mg by mouth daily as needed for allergies.   Marland Kitchen losartan (COZAAR) 100 MG tablet Take 1 tablet (100 mg total) by mouth daily.  Marland Kitchen omeprazole (PRILOSEC) 20 MG capsule Take 1 capsule (20 mg total) by mouth daily.  . SUMAtriptan (IMITREX) 100 MG tablet Take 1 tablet (100 mg total) by mouth every 8 (eight) hours as needed.  . [DISCONTINUED] atenolol (TENORMIN) 100 MG tablet TAKE 1 TABLET BY MOUTH TWICE DAILY  . [DISCONTINUED] busPIRone (BUSPAR) 5 MG tablet Take 1 tablet (5 mg total) by mouth 3 (three) times  daily. For anxiety  . [DISCONTINUED] hydrALAZINE (APRESOLINE) 10 MG tablet TAKE 1 TABLET THREE TIMES DAILY  . [DISCONTINUED] losartan (COZAAR) 100 MG tablet TAKE ONE TABLET EVERY DAY  . [DISCONTINUED] SUMAtriptan (IMITREX) 100 MG tablet Take 1 tablet by mouth every 8 (eight) hours as needed.  Marland Kitchen lisdexamfetamine (VYVANSE) 30 MG capsule Take 1 capsule (30 mg total) by mouth daily. (Patient not taking: Reported on 09/12/2018)  . SUMAtriptan (IMITREX) 20 MG/ACT nasal spray Place 1 spray (20 mg total) into the nose once for 1 dose. May repeat in 2 hours if headache persists or recurs.  . traMADol (ULTRAM) 50 MG tablet Take 50 mg by mouth every 6 (six) hours as needed.  . [DISCONTINUED] ALPRAZolam (XANAX) 1 MG tablet Take 1 tablet (1 mg total) by mouth at bedtime as needed for anxiety. (Patient not taking: Reported on 09/12/2018)  . [DISCONTINUED] aspirin 81 MG chewable tablet Chew 1 tablet (81 mg total) by mouth daily. (Patient not taking: Reported on 09/12/2018)  . [DISCONTINUED] docusate sodium (COLACE) 100 MG capsule Take 1 capsule (100 mg total) by mouth 2 (  two) times daily. (Patient not taking: Reported on 09/12/2018)  . [DISCONTINUED] meclizine (ANTIVERT) 12.5 MG tablet Take 1 tablet (12.5 mg total) by mouth 3 (three) times daily as needed for dizziness. (Patient not taking: Reported on 09/12/2018)  . [DISCONTINUED] ondansetron (ZOFRAN ODT) 4 MG disintegrating tablet Take 1 tablet (4 mg total) by mouth every 6 (six) hours as needed for nausea or vomiting. (Patient not taking: Reported on 09/12/2018)  . [DISCONTINUED] SUMAtriptan (IMITREX) 20 MG/ACT nasal spray Place 1 spray (20 mg total) into the nose once for 1 dose. May repeat in 2 hours if headache persists or recurs.  . [DISCONTINUED] vancomycin (VANCOCIN) IVPB 1000 mg/200 mL premix    No facility-administered encounter medications on file as of 09/12/2018.    Allergies  Allergen Reactions  . Amlodipine Swelling  . Topamax [Topiramate] Other (See  Comments)    "shut my body down."  . Tylenol [Acetaminophen] Other (See Comments)    Elevated liver enzymes    Social History   Socioeconomic History  . Marital status: Divorced    Spouse name: Not on file  . Number of children: Not on file  . Years of education: Not on file  . Highest education level: Not on file  Occupational History  . Not on file  Social Needs  . Financial resource strain: Not on file  . Food insecurity:    Worry: Not on file    Inability: Not on file  . Transportation needs:    Medical: Not on file    Non-medical: Not on file  Tobacco Use  . Smoking status: Former Smoker    Last attempt to quit: 11/05/1988    Years since quitting: 29.8  . Smokeless tobacco: Never Used  Substance and Sexual Activity  . Alcohol use: Yes    Alcohol/week: 7.0 standard drinks    Types: 7 Glasses of wine per week  . Drug use: No  . Sexual activity: Never  Lifestyle  . Physical activity:    Days per week: Not on file    Minutes per session: Not on file  . Stress: Not on file  Relationships  . Social connections:    Talks on phone: Not on file    Gets together: Not on file    Attends religious service: Not on file    Active member of club or organization: Not on file    Attends meetings of clubs or organizations: Not on file    Relationship status: Not on file  Other Topics Concern  . Not on file  Social History Narrative  . Not on file   Family History  Problem Relation Age of Onset  . Heart failure Father   . Atrial fibrillation Mother   . Cerebral palsy Brother   . Heart attack Maternal Grandfather   . CAD Paternal Grandmother   . Colon cancer Neg Hx     Review of Systems  Constitutional: Negative.   Respiratory: Negative.   Cardiovascular: Negative.   Skin: Negative.   Neurological: Negative.   Psychiatric/Behavioral: Positive for decreased concentration and sleep disturbance. Negative for agitation, behavioral problems, confusion, dysphoric mood,  hallucinations, self-injury and suicidal ideas. The patient is not nervous/anxious and is not hyperactive.     Per HPI unless specifically indicated above     Objective:    BP (!) 188/100 (BP Location: Left Arm, Patient Position: Sitting, Cuff Size: Normal) Comment: Manually  Pulse (!) 58   Temp 97.9 F (36.6 C) (Oral)   Ht  5' 4.5" (1.638 m)   Wt 144 lb 6.4 oz (65.5 kg)   SpO2 100%   BMI 24.40 kg/m   Wt Readings from Last 3 Encounters:  09/12/18 144 lb 6.4 oz (65.5 kg)  07/11/18 148 lb (67.1 kg)  06/12/18 143 lb (64.9 kg)    Physical Exam  Constitutional: She is oriented to person, place, and time. She appears well-developed and well-nourished. No distress.  HENT:  Head: Normocephalic and atraumatic.  Right Ear: Hearing normal.  Left Ear: Hearing normal.  Nose: Nose normal.  Eyes: Conjunctivae and lids are normal. Right eye exhibits no discharge. Left eye exhibits no discharge. No scleral icterus.  Cardiovascular: Normal rate, regular rhythm, normal heart sounds and intact distal pulses. Exam reveals no gallop and no friction rub.  No murmur heard. Pulmonary/Chest: Effort normal and breath sounds normal. No stridor. No respiratory distress. She has no wheezes. She has no rales. She exhibits no tenderness.  Musculoskeletal: Normal range of motion.  Neurological: She is alert and oriented to person, place, and time.  Skin: Skin is warm, dry and intact. Capillary refill takes less than 2 seconds. No rash noted. She is not diaphoretic. No erythema. No pallor.  Psychiatric: She has a normal mood and affect. Her speech is normal and behavior is normal. Judgment and thought content normal. Cognition and memory are normal.  Nursing note and vitals reviewed.   Results for orders placed or performed in visit on 06/26/18  BASIC METABOLIC PANEL WITH GFR  Result Value Ref Range   Glucose, Bld 104 (H) 65 - 99 mg/dL   BUN 11 7 - 25 mg/dL   Creat 1.61 0.96 - 0.45 mg/dL   GFR, Est Non  African American 75 > OR = 60 mL/min/1.16m2   GFR, Est African American 87 > OR = 60 mL/min/1.60m2   BUN/Creatinine Ratio NOT APPLICABLE 6 - 22 (calc)   Sodium 142 135 - 146 mmol/L   Potassium 3.9 3.5 - 5.3 mmol/L   Chloride 105 98 - 110 mmol/L   CO2 24 20 - 32 mmol/L   Calcium 9.4 8.6 - 10.4 mg/dL      Assessment & Plan:   Problem List Items Addressed This Visit      Cardiovascular and Mediastinum   Benign essential HTN - Primary    Not under good control. Will increase her hydalazine to 25mg  TID and recheck 2 weeks. Call with any concerns. Labs checked today.      Relevant Medications   losartan (COZAAR) 100 MG tablet   atenolol (TENORMIN) 100 MG tablet   hydrALAZINE (APRESOLINE) 25 MG tablet   Other Relevant Orders   CBC with Differential/Platelet   Comprehensive metabolic panel   Microalbumin, Urine Waived   TSH   Migraine with aura    Under good control. Continue current regimen. Continue to monitor. Call with any concerns.       Relevant Medications   losartan (COZAAR) 100 MG tablet   SUMAtriptan (IMITREX) 20 MG/ACT nasal spray   SUMAtriptan (IMITREX) 100 MG tablet   atenolol (TENORMIN) 100 MG tablet   hydrALAZINE (APRESOLINE) 25 MG tablet   Other Relevant Orders   CBC with Differential/Platelet   Comprehensive metabolic panel   TSH     Other   ADHD (attention deficit hyperactivity disorder)    BP high today. Discussed that we do not prescribe controlled substances on the first visit and that we cannot prescribe this for her with the blood pressure being so high. Recheck  2 weeks, if BP better, can restart vyvanse.       Cannot sleep    Wakes feeling refreshed. No need for medication at this time. If this changes, can consider something.       Vitamin B12 deficiency    Checking labs today. Await results. Call with any concerns.       Relevant Orders   CBC with Differential/Platelet   Comprehensive metabolic panel   Z61 and Folate Panel    Other Visit  Diagnoses    Hx: UTI (urinary tract infection)       Checking urine today. Await results.    Relevant Orders   CBC with Differential/Platelet   Comprehensive metabolic panel   UA/M w/rflx Culture, Routine   Screening for cholesterol level       Labs drawn today, await results.    Relevant Orders   Lipid Panel w/o Chol/HDL Ratio       Follow up plan: Return in about 2 weeks (around 09/26/2018) for Follow up BP.

## 2018-09-12 NOTE — Assessment & Plan Note (Signed)
Under good control. Continue current regimen. Continue to monitor. Call with any concerns. 

## 2018-09-13 ENCOUNTER — Ambulatory Visit
Admission: EM | Admit: 2018-09-13 | Discharge: 2018-09-13 | Disposition: A | Discharge status: Home / Self Care | Payer: 59 | Attending: Family Medicine | Admitting: Family Medicine

## 2018-09-13 ENCOUNTER — Other Ambulatory Visit: Payer: Self-pay

## 2018-09-13 DIAGNOSIS — I10 Essential (primary) hypertension: Secondary | ICD-10-CM

## 2018-09-13 LAB — COMPREHENSIVE METABOLIC PANEL
ALT: 17 IU/L (ref 0–32)
AST: 20 IU/L (ref 0–40)
Albumin/Globulin Ratio: 1.4 (ref 1.2–2.2)
Albumin: 4.2 g/dL (ref 3.6–4.8)
Alkaline Phosphatase: 74 IU/L (ref 39–117)
BUN/Creatinine Ratio: 22 (ref 12–28)
BUN: 19 mg/dL (ref 8–27)
CALCIUM: 9.6 mg/dL (ref 8.7–10.3)
CHLORIDE: 105 mmol/L (ref 96–106)
CO2: 20 mmol/L (ref 20–29)
Creatinine, Ser: 0.87 mg/dL (ref 0.57–1.00)
GFR, EST AFRICAN AMERICAN: 81 mL/min/{1.73_m2} (ref >59)
GFR, EST NON AFRICAN AMERICAN: 70 mL/min/{1.73_m2} (ref >59)
GLUCOSE: 84 mg/dL (ref 65–99)
Globulin, Total: 3 g/dL (ref 1.5–4.5)
Potassium: 6.3 mmol/L — ABNORMAL HIGH (ref 3.5–5.2)
Sodium: 141 mmol/L (ref 134–144)
TOTAL PROTEIN: 7.2 g/dL (ref 6.0–8.5)

## 2018-09-13 LAB — CBC WITH DIFFERENTIAL/PLATELET
BASOS ABS: 0.1 10*3/uL (ref 0.0–0.2)
Basos: 1 %
EOS (ABSOLUTE): 0.4 10*3/uL (ref 0.0–0.4)
Eos: 7 %
Hematocrit: 37.3 % (ref 34.0–46.6)
Hemoglobin: 12.4 g/dL (ref 11.1–15.9)
IMMATURE GRANS (ABS): 0 10*3/uL (ref 0.0–0.1)
IMMATURE GRANULOCYTES: 0 %
LYMPHS: 23 %
Lymphocytes Absolute: 1.3 10*3/uL (ref 0.7–3.1)
MCH: 35 pg — ABNORMAL HIGH (ref 26.6–33.0)
MCHC: 33.2 g/dL (ref 31.5–35.7)
MCV: 105 fL — ABNORMAL HIGH (ref 79–97)
Monocytes Absolute: 0.6 10*3/uL (ref 0.1–0.9)
Monocytes: 11 %
NEUTROS PCT: 58 %
Neutrophils Absolute: 3.2 10*3/uL (ref 1.4–7.0)
Platelets: 317 10*3/uL (ref 150–450)
RBC: 3.54 x10E6/uL — ABNORMAL LOW (ref 3.77–5.28)
RDW: 13.8 % (ref 12.3–15.4)
WBC: 5.4 10*3/uL (ref 3.4–10.8)

## 2018-09-13 LAB — LIPID PANEL W/O CHOL/HDL RATIO
Cholesterol, Total: 141 mg/dL (ref 100–199)
HDL: 66 mg/dL (ref >39)
LDL Calculated: 56 mg/dL (ref 0–99)
Triglycerides: 93 mg/dL (ref 0–149)
VLDL Cholesterol Cal: 19 mg/dL (ref 5–40)

## 2018-09-13 LAB — TSH: TSH: 2.09 u[IU]/mL (ref 0.450–4.500)

## 2018-09-13 LAB — B12 AND FOLATE PANEL: Folate: 8.1 ng/mL (ref >3.0)

## 2018-09-13 NOTE — ED Provider Notes (Signed)
[facility]    CSN: [id number] Arrival date & time: 09/13/18  1050     History   Chief Complaint Chief Complaint  Patient presents with  . Hypertension    HPI Ashley [name] is a 67 y.o. female.   HPI  67 year old female presents because of hypertension.  She states that she was seen by her primary care physician yesterday Dr. Laural Benes. Because her blood pressure was high she increased her hydralazine to 25 mg 3 times daily.  She  went to pick up her prescription at the pharmacy today and she used the pharmacy blood pressure cuff showing it to be 184/112.  Is very upset that the hydralazine was not helping lower her blood pressure.  Does not relate any symptoms of chest pain, shortness of breath, headache, blurry vision ,numbness tingling etc.  States that she feels fine just anxious.  Has a follow-up appointment with Dr. Laural Benes in 2 weeks for reevaluation of the blood pressure and the change in her medications          Past Medical History:  Diagnosis Date  . ADHD (attention deficit hyperactivity disorder) 05/02/2015  . Anemia   . Anxiety   . Arthritis   . Attention deficit disorder   . Autoimmune liver disease   . Barrett's esophagus   . COPD (chronic obstructive pulmonary disease) (HCC)   . Depression   . DEPRESSION, HX OF 04/21/2008   Qualifier: Diagnosis of  By: Nelson-Smith CMA (AAMA), Dottie    . Gastritis   . GERD (gastroesophageal reflux disease)   . Hypertension   . Insomnia   . Internal hemorrhoids   . Liver failure (HCC)   . Migraine   . Ovarian failure   . Peptic ulcer disease   . PEPTIC ULCER DISEASE, HX OF 04/21/2008   Qualifier: Diagnosis of  By: Nelson-Smith CMA (AAMA), Dottie    . Small bowel obstruction (HCC)   . SMALL BOWEL OBSTRUCTION, HX OF 04/21/2008   Qualifier: Diagnosis of  By: Nelson-Smith CMA (AAMA), Dottie    . Status post right knee replacement 05/14/2018    Patient Active Problem List   Diagnosis Date Noted  .  Anemia, macrocytic 06/20/2018  . Alcohol use 05/12/2018  . Vitamin B12 deficiency 05/12/2018  . Controlled substance agreement signed 04/10/2016  . Allergic rhinitis 09/19/2015  . Arthritis of knee, degenerative 09/19/2015  . ADHD (attention deficit hyperactivity disorder) 05/02/2015  . INTERNAL HEMORRHOIDS 04/21/2008  . COPD (chronic obstructive pulmonary disease) (HCC) 04/21/2008  . BARRETTS ESOPHAGUS 04/21/2008  . Benign essential HTN 01/26/2008  . Cannot sleep 01/26/2008  . Migraine with aura 01/26/2008    Past Surgical History:  Procedure Laterality Date  . ABDOMINAL HYSTERECTOMY    . DILATION AND CURETTAGE OF UTERUS    . LIVER BIOPSY    . OOPHORECTOMY    . TOTAL KNEE ARTHROPLASTY Right 05/14/2018   Procedure: TOTAL KNEE ARTHROPLASTY;  Surgeon: Lyndle Herrlich, MD;  Location: [facility] ORS;  Service: Orthopedics;  Laterality: Right;    OB History   None      Home Medications    Prior to Admission medications   Medication Sig Start Date End Date Taking? Authorizing Provider  atenolol (TENORMIN) 100 MG tablet Take 1 tablet (100 mg total) by mouth 2 (two) times daily. 09/12/18  Yes Johnson, Megan P, DO  busPIRone (BUSPAR) 5 MG tablet Take 1 tablet (5 mg total) by mouth 3 (three) times daily. For anxiety 09/12/18  Yes Laural Benes,  Megan P, DO  Cholecalciferol (VITAMIN D3) 5000 units CAPS Take 1 capsule by mouth daily.   Yes [provider]  Cyanocobalamin (VITAMIN B 12 PO) Take 3,000 mcg by mouth daily.    Yes [provider]  hydrALAZINE (APRESOLINE) 25 MG tablet Take 1 tablet (25 mg total) by mouth 3 (three) times daily. 09/12/18  Yes Johnson, Megan P, DO  loratadine (CLARITIN) 10 MG tablet Take 10 mg by mouth daily as needed for allergies.    Yes [provider]  losartan (COZAAR) 100 MG tablet Take 1 tablet (100 mg total) by mouth daily. 09/12/18  Yes Johnson, Megan P, DO  omeprazole (PRILOSEC) 20 MG capsule Take 1 capsule (20 mg total) by mouth daily.  05/12/18  Yes Lada, Janit Bern, MD  SUMAtriptan (IMITREX) 100 MG tablet Take 1 tablet (100 mg total) by mouth every 8 (eight) hours as needed. 09/12/18  Yes Johnson, Megan P, DO  traMADol (ULTRAM) 50 MG tablet Take 50 mg by mouth every 6 (six) hours as needed.   Yes [provider]  lisdexamfetamine (VYVANSE) 30 MG capsule Take 1 capsule (30 mg total) by mouth daily. Patient not taking: Reported on 09/12/2018 05/17/18   Lyndle Herrlich, MD  SUMAtriptan [facility]) 20 MG/ACT nasal spray Place 1 spray (20 mg total) into the nose once for 1 dose. May repeat in 2 hours if headache persists or recurs. 09/12/18 09/12/18  Dorcas Carrow, DO    Family History Family History  Problem Relation Age of Onset  . Heart failure Father   . Atrial fibrillation Mother   . Cerebral palsy Brother   . Heart attack Maternal Grandfather   . CAD Paternal Grandmother   . Colon cancer Neg Hx     Social History Social History   Tobacco Use  . Smoking status: Former Smoker    Last attempt to quit: 11/05/1988    Years since quitting: 29.8  . Smokeless tobacco: Never Used  Substance Use Topics  . Alcohol use: Yes    Alcohol/week: 7.0 standard drinks    Types: 7 Glasses of wine per week  . Drug use: No     Allergies   Amlodipine; Topamax [topiramate]; and Tylenol [acetaminophen]   Review of Systems Review of Systems  Constitutional: Positive for activity change. Negative for chills, diaphoresis, fatigue and fever.  Respiratory: Negative for shortness of breath.   Cardiovascular: Negative for chest pain.  Neurological: Negative for dizziness, tremors, syncope, speech difficulty, weakness, numbness and headaches.  All other systems reviewed and are negative.    Physical Exam Triage Vital Signs ED Triage Vitals [09/13/18 1105]  Enc Vitals Group     BP (!) 187/96     Pulse Rate 60     Resp 18     Temp 97.6 F (36.4 C)     Temp Source Oral     SpO2 97 %     Weight 144 lb 6.4 oz (65.5 kg)      Height 5' 4.5" (1.638 m)     Head Circumference      Peak Flow      Pain Score 0     Pain Loc      Pain Edu?      Excl. in GC?    No data found.  Updated Vital Signs BP (!) 158/94 (BP Location: Left Arm)   Pulse 60   Temp 97.6 F (36.4 C) (Oral)   Resp 18   Ht 5' 4.5" (1.638  m)   Wt 144 lb 6.4 oz (65.5 kg)   SpO2 97%   BMI 24.40 kg/m   Visual Acuity Right Eye Distance:   Left Eye Distance:   Bilateral Distance:    Right Eye Near:   Left Eye Near:    Bilateral Near:     Physical Exam  Constitutional: She is oriented to person, place, and time. She appears well-developed and well-nourished. No distress.  HENT:  Head: Normocephalic.  Eyes: Pupils are equal, round, and reactive to light.  Neck: Normal range of motion.  Cardiovascular: Normal rate, regular rhythm and normal heart sounds.  Pulmonary/Chest: Effort normal and breath sounds normal.  Musculoskeletal: Normal range of motion.  Neurological: She is alert and oriented to person, place, and time.  Skin: Skin is warm and dry. She is not diaphoretic.  Psychiatric: She has a normal mood and affect. Her behavior is normal. Judgment and thought content normal.  Nursing note and vitals reviewed.    UC Treatments / Results  Labs (all labs ordered are listed, but only abnormal results are displayed) Labs Reviewed - No data to display  EKG None  Radiology No results found.  Procedures Procedures (including critical care time)  Medications Ordered in UC Medications - No data to display  Initial Impression / Assessment and Plan / UC Course  I have reviewed the triage vital signs and the nursing notes.  Pertinent labs & imaging results that were available during my care of the patient were reviewed by me and considered in my medical decision making (see chart for details).     Repeat manual  blood pressure in our office after talking to the patient was 158/94.  Patient is not having any symptoms of high  blood pressure felt she would be able to be released home without incident.  Told her not to take her blood pressures frequently; only 3 times a week if at all recording date, time and blood pressure.  Told her that the hydralazine change medication does not act immediately and that she would expect gradual reduction of her blood pressure over time.  She will need to keep her appointment with Dr. Laural Benes in 2 weeks.  He has any problems in the interim with any symptoms secondary to her blood pressure I have advised her to go immediately to the emergency room. Final Clinical Impressions(s) / UC Diagnoses   Final diagnoses:  Essential hypertension   Discharge Instructions   None    ED Prescriptions    None     Controlled Substance Prescriptions  Controlled Substance Registry consulted? Not Applicable   Lutricia Feil, PA-C 09/13/18 1229

## 2018-09-13 NOTE — ED Triage Notes (Signed)
Patient states that she was seen by her Primary doctor yesterday and blood pressure was high, increased her Hydralazine to 25 mg. Patient states that she checked her blood pressure this morning at the pharmacy and was 184/112. Patient states that she doesn't believe the hydralazine is helping at all.

## 2018-09-24 ENCOUNTER — Ambulatory Visit: Payer: Self-pay | Admitting: *Deleted

## 2018-09-24 NOTE — Telephone Encounter (Signed)
"  Pt states her BP meds were adjusted recently, and she is taking them as directed, but her BP is still elevated (179/97 last night at 5:15pm). She has not been able to check it today, but she feels like it is elevated today and she is feeling very anxious. Pt states she will be available until 2:45pm and then she will need to return to work. After that, she will be available after 4:30pm, but she would really like to speak with a nurse about possibly increasing her meds".  Called pt regarding her b/p from yesterday. She has not checked her b/p today (she is afraid of what it may be). She gets anxious even talking about checking her b/p and labs. She denied having any symptoms yesterday and no cardiac symptoms today. She is requesting a call back regarding whether to increase her hydralazine. She still has 10 mg available.  She also would like to know the results of her labs from 09/12/18. She knows that they are in MyChart but gets anxious when sees something that is abnormal. So she would like a call back to go over her labs with her. [facility].  Reason for Disposition . Caller has URGENT medication question about med that PCP prescribed and triager unable to answer question  Protocols used: MEDICATION QUESTION CALL-A-AH

## 2018-09-25 MED ORDER — HYDRALAZINE HCL 50 MG PO TABS: 50.0000 mg | ORAL_TABLET | Freq: Three times a day (TID) | ORAL | 1 refills | Status: DC

## 2018-09-25 NOTE — Telephone Encounter (Signed)
Please have her increase her hydralazine to 50mg  3x a day. I've sent a new Rx to her pharmacy. Thanks!

## 2018-09-25 NOTE — Addendum Note (Signed)
Addended by: Dorcas CarrowJOHNSON, MEGAN P on: 09/25/2018 01:49 PM   Modules accepted: Orders

## 2018-09-25 NOTE — Telephone Encounter (Signed)
Message relayed to patient. Verbalized understanding and denied questions.   

## 2018-09-29 ENCOUNTER — Other Ambulatory Visit: Payer: Self-pay | Admitting: Family Medicine

## 2018-09-29 ENCOUNTER — Ambulatory Visit: Payer: 59 | Admitting: Family Medicine

## 2018-10-27 ENCOUNTER — Other Ambulatory Visit: Payer: Self-pay | Admitting: Family Medicine

## 2018-11-28 ENCOUNTER — Other Ambulatory Visit: Payer: Self-pay | Admitting: Family Medicine

## 2019-01-26 ENCOUNTER — Other Ambulatory Visit: Payer: Self-pay | Admitting: Family Medicine

## 2019-01-29 ENCOUNTER — Other Ambulatory Visit: Payer: Self-pay | Admitting: Family Medicine

## 2019-01-29 NOTE — Telephone Encounter (Signed)
Refill request for omeprazole; last office visit 09/12/18; no upcoming visits noted; refill note from 12/01/2018 reads "appt for more refills"; attempted to contact pt; unable to leave voicemail; pre-recorded message states the voicemail is full; will route to office for final disposition.

## 2019-02-18 ENCOUNTER — Telehealth: Payer: Self-pay | Admitting: Family Medicine

## 2019-02-18 NOTE — Telephone Encounter (Signed)
Called pt to set up virtual appointment for Friday no answer, left voicemail.

## 2019-02-20 ENCOUNTER — Ambulatory Visit: Payer: 59 | Admitting: Family Medicine

## 2019-02-26 ENCOUNTER — Other Ambulatory Visit: Payer: Self-pay | Admitting: Family Medicine

## 2019-02-26 NOTE — Telephone Encounter (Signed)
Requested Prescriptions  Pending Prescriptions Disp Refills  . atenolol (TENORMIN) 100 MG tablet [Pharmacy Med Name: ATENOLOL 100 MG TAB] 180 tablet 1    Sig: TAKE ONE TABLET TWICE DAILY     Cardiovascular:  Beta Blockers Failed - 02/26/2019 11:13 AM      Failed - Last BP in normal range    BP Readings from Last 1 Encounters:  09/13/18 (!) 158/94         Passed - Last Heart Rate in normal range    Pulse Readings from Last 1 Encounters:  09/13/18 60         Passed - Valid encounter within last 6 months    Recent Outpatient Visits          5 months ago Benign essential HTN   [facility] [place], [place], DO      Future Appointments            In 2 weeks Laural Benes, Oralia Rud, DO [facility]         . omeprazole (PRILOSEC) 20 MG capsule [Pharmacy Med Name: OMEPRAZOLE DR 20 MG CAP] 30 capsule 0    Sig: TAKE 1 CAPSULE EVERY DAY NEEDS APPT FOR MORE REFILLS     Gastroenterology: Proton Pump Inhibitors Passed - 02/26/2019 11:13 AM      Passed - Valid encounter within last 12 months    Recent Outpatient Visits          5 months ago Benign essential HTN   [facility] [place], Oralia Rud, DO      Future Appointments            In 2 weeks Laural Benes, Oralia Rud, DO [facility]

## 2019-03-17 ENCOUNTER — Other Ambulatory Visit: Payer: Self-pay

## 2019-03-17 ENCOUNTER — Ambulatory Visit (INDEPENDENT_AMBULATORY_CARE_PROVIDER_SITE_OTHER): Payer: 59 | Admitting: Family Medicine

## 2019-03-17 ENCOUNTER — Encounter: Payer: Self-pay | Admitting: Family Medicine

## 2019-03-17 DIAGNOSIS — I10 Essential (primary) hypertension: Secondary | ICD-10-CM

## 2019-03-17 NOTE — Progress Notes (Signed)
There were no vitals taken for this visit.   Subjective:    Patient ID: Ashley [name], female    DOB: 06-28-1951, 68 y.o.   MRN: [id number]  HPI: Ashley [name] is a 68 y.o. female  Chief Complaint  Patient presents with  . Follow-up  . Hypertension   HYPERTENSION Hypertension status: controlled  Satisfied with current treatment? yes Duration of hypertension: chronic BP monitoring frequency:  not checking BP medication side effects:  no Medication compliance: excellent compliance Previous BP meds: atenolol, hydralazine, losartan Aspirin: no Recurrent headaches: no Visual changes: no Palpitations: no Dyspnea: no Chest pain: no Lower extremity edema: no Dizzy/lightheaded: no  Relevant past medical, surgical, family and social history reviewed and updated as indicated. Interim medical history since our last visit reviewed. Allergies and medications reviewed and updated.  Review of Systems  Constitutional: Negative.   Respiratory: Negative.   Cardiovascular: Negative.   Gastrointestinal: Negative.   Musculoskeletal: Negative.   Skin: Negative.   Neurological: Negative.   Psychiatric/Behavioral: Positive for decreased concentration. Negative for agitation, behavioral problems, confusion, dysphoric mood, hallucinations, self-injury, sleep disturbance and suicidal ideas. The patient is not nervous/anxious and is not hyperactive.     Per HPI unless specifically indicated above     Objective:    There were no vitals taken for this visit.  Wt Readings from Last 3 Encounters:  09/13/18 144 lb 6.4 oz (65.5 kg)  09/12/18 144 lb 6.4 oz (65.5 kg)  07/11/18 148 lb (67.1 kg)    Physical Exam Vitals signs and nursing note reviewed.  Pulmonary:     Effort: Pulmonary effort is normal. No respiratory distress.     Comments: Speaking in full sentences Neurological:     Mental Status: She is alert.  Psychiatric:        Mood and Affect: Mood normal.        Behavior:  Behavior normal.        Thought Content: Thought content normal.        Judgment: Judgment normal.     Results for orders placed or performed in visit on 09/12/18  Microscopic Examination  Result Value Ref Range   WBC, UA 0-5 0 - 5 /hpf   RBC, UA None seen 0 - 2 /hpf   Epithelial Cells (non renal) 0-10 0 - 10 /hpf   Mucus, UA Present (A) Not Estab.   Bacteria, UA None seen None seen/Few  CBC with Differential/Platelet  Result Value Ref Range   WBC 5.4 3.4 - 10.8 x10E3/uL   RBC 3.54 (L) 3.77 - 5.28 x10E6/uL   Hemoglobin 12.4 11.1 - 15.9 g/dL   Hematocrit 40.937.3 81.134.0 - 46.6 %   MCV 105 (H) 79 - 97 fL   MCH 35.0 (H) 26.6 - 33.0 pg   MCHC 33.2 31.5 - 35.7 g/dL   RDW 91.413.8 78.212.3 - 95.615.4 %   Platelets 317 150 - 450 x10E3/uL   Neutrophils 58 Not Estab. %   Lymphs 23 Not Estab. %   Monocytes 11 Not Estab. %   Eos 7 Not Estab. %   Basos 1 Not Estab. %   Neutrophils Absolute 3.2 1.4 - 7.0 x10E3/uL   Lymphocytes Absolute 1.3 0.7 - 3.1 x10E3/uL   Monocytes Absolute 0.6 0.1 - 0.9 x10E3/uL   EOS (ABSOLUTE) 0.4 0.0 - 0.4 x10E3/uL   Basophils Absolute 0.1 0.0 - 0.2 x10E3/uL   Immature Granulocytes 0 Not Estab. %   Immature Grans (Abs) 0.0 0.0 - 0.1 x10E3/uL  Comprehensive metabolic panel  Result Value Ref Range   Glucose 84 65 - 99 mg/dL   BUN 19 8 - 27 mg/dL   Creatinine, Ser 1.61 0.57 - 1.00 mg/dL   GFR calc non Af Amer 70 >59 mL/min/1.73   GFR calc Af Amer 81 >59 mL/min/1.73   BUN/Creatinine Ratio 22 12 - 28   Sodium 141 134 - 144 mmol/L   Potassium 6.3 (H) 3.5 - 5.2 mmol/L   Chloride 105 96 - 106 mmol/L   CO2 20 20 - 29 mmol/L   Calcium 9.6 8.7 - 10.3 mg/dL   Total Protein 7.2 6.0 - 8.5 g/dL   Albumin 4.2 3.6 - 4.8 g/dL   Globulin, Total 3.0 1.5 - 4.5 g/dL   Albumin/Globulin Ratio 1.4 1.2 - 2.2   Bilirubin Total <0.2 0.0 - 1.2 mg/dL   Alkaline Phosphatase 74 39 - 117 IU/L   AST 20 0 - 40 IU/L   ALT 17 0 - 32 IU/L  Lipid Panel w/o Chol/HDL Ratio  Result Value Ref Range    Cholesterol, Total 141 100 - 199 mg/dL   Triglycerides 93 0 - 149 mg/dL   HDL 66 >09 mg/dL   VLDL Cholesterol Cal 19 5 - 40 mg/dL   LDL Calculated 56 0 - 99 mg/dL  Microalbumin, Urine Waived  Result Value Ref Range   Microalb, Ur Waived 80 (H) 0 - 19 mg/L   Creatinine, Urine Waived 300 10 - 300 mg/dL   Microalb/Creat Ratio 30-300 (H) <30 mg/g  TSH  Result Value Ref Range   TSH 2.090 0.450 - 4.500 uIU/mL  UA/M w/rflx Culture, Routine  Result Value Ref Range   Specific Gravity, UA 1.020 1.005 - 1.030   pH, UA 5.0 5.0 - 7.5   Color, UA Yellow Yellow   Appearance Ur Clear Clear   Leukocytes, UA Negative Negative   Protein, UA Negative Negative/Trace   Glucose, UA Negative Negative   Ketones, UA Trace (A) Negative   RBC, UA Negative Negative   Bilirubin, UA Negative Negative   Urobilinogen, Ur 0.2 0.2 - 1.0 mg/dL   Nitrite, UA Negative Negative   Microscopic Examination See below:   B12 and Folate Panel  Result Value Ref Range   Vitamin B-12 >2000 (H) 232 - 1245 pg/mL   Folate 8.1 >3.0 ng/mL      Assessment & Plan:   Problem List Items Addressed This Visit      Cardiovascular and Mediastinum   Benign essential HTN - Primary    Stable on current regimen per patient report. Will get her in for BP check and BMP. Call with any concerns. Refills given today. Continue to monitor.       Relevant Orders   Basic metabolic panel       Follow up plan: Return 1-3 months, for physical and discuss ADD.    . This visit was completed via telephone due to the restrictions of the COVID-19 pandemic. All issues as above were discussed and addressed but no physical exam was performed. If it was felt that the patient should be evaluated in the office, they were directed there. The patient verbally consented to this visit. Patient was unable to complete an audio/visual visit due to Lack of equipment. Due to the catastrophic nature of the COVID-19 pandemic, this visit was done through audio  contact only. . Location of the patient: work . Location of the provider: work . Those involved with this call:  . Provider: Olevia Perches,  DO . CMA: Wilhemena Durie, CMA . Front Desk/Registration: Adela Ports  . Time spent on call: 22 minutes on the phone discussing health concerns. 24 minutes total spent in review of patient's record and preparation of their chart.

## 2019-03-17 NOTE — Assessment & Plan Note (Signed)
Stable on current regimen per patient report. Will get her in for BP check and BMP. Call with any concerns. Refills given today. Continue to monitor.

## 2019-03-18 ENCOUNTER — Ambulatory Visit: Payer: 59 | Admitting: Family Medicine

## 2019-03-25 ENCOUNTER — Other Ambulatory Visit: Payer: 59

## 2019-03-25 ENCOUNTER — Telehealth: Payer: Self-pay | Admitting: Family Medicine

## 2019-03-25 NOTE — Telephone Encounter (Signed)
Called pt to go over covid-19 screening for tomorrow's appt, no answer, left voicemail.

## 2019-03-26 ENCOUNTER — Other Ambulatory Visit: Payer: 59

## 2019-03-30 ENCOUNTER — Other Ambulatory Visit: Payer: Self-pay | Admitting: Family Medicine

## 2019-04-20 ENCOUNTER — Other Ambulatory Visit: Payer: Self-pay | Admitting: Family Medicine

## 2019-04-21 ENCOUNTER — Other Ambulatory Visit: Payer: Self-pay | Admitting: Family Medicine

## 2019-04-24 ENCOUNTER — Other Ambulatory Visit: Payer: Self-pay | Admitting: Family Medicine

## 2019-04-28 ENCOUNTER — Other Ambulatory Visit: Payer: Self-pay | Admitting: Nurse Practitioner

## 2019-04-28 ENCOUNTER — Other Ambulatory Visit: Payer: Self-pay | Admitting: Family Medicine

## 2019-05-26 ENCOUNTER — Ambulatory Visit: Payer: 59 | Admitting: Family Medicine

## 2019-05-26 ENCOUNTER — Other Ambulatory Visit: Payer: Self-pay

## 2019-05-26 ENCOUNTER — Telehealth: Payer: Self-pay | Admitting: Family Medicine

## 2019-05-26 ENCOUNTER — Encounter: Payer: Self-pay | Admitting: Family Medicine

## 2019-05-26 VITALS — BP 182/91 | HR 65 | Temp 97.9°F | Ht 64.5 in | Wt 156.4 lb

## 2019-05-26 DIAGNOSIS — F901 Attention-deficit hyperactivity disorder, predominantly hyperactive type: Secondary | ICD-10-CM | POA: Diagnosis not present

## 2019-05-26 DIAGNOSIS — Z1239 Encounter for other screening for malignant neoplasm of breast: Secondary | ICD-10-CM

## 2019-05-26 DIAGNOSIS — D539 Nutritional anemia, unspecified: Secondary | ICD-10-CM

## 2019-05-26 DIAGNOSIS — J449 Chronic obstructive pulmonary disease, unspecified: Secondary | ICD-10-CM

## 2019-05-26 DIAGNOSIS — I10 Essential (primary) hypertension: Secondary | ICD-10-CM | POA: Diagnosis not present

## 2019-05-26 DIAGNOSIS — E538 Deficiency of other specified B group vitamins: Secondary | ICD-10-CM | POA: Diagnosis not present

## 2019-05-26 DIAGNOSIS — Z0000 Encounter for general adult medical examination without abnormal findings: Secondary | ICD-10-CM | POA: Diagnosis not present

## 2019-05-26 DIAGNOSIS — Z23 Encounter for immunization: Secondary | ICD-10-CM

## 2019-05-26 DIAGNOSIS — Z1211 Encounter for screening for malignant neoplasm of colon: Secondary | ICD-10-CM

## 2019-05-26 DIAGNOSIS — G43101 Migraine with aura, not intractable, with status migrainosus: Secondary | ICD-10-CM

## 2019-05-26 DIAGNOSIS — Z13820 Encounter for screening for osteoporosis: Secondary | ICD-10-CM

## 2019-05-26 MED ORDER — ATENOLOL 100 MG PO TABS: 100.0000 mg | ORAL_TABLET | Freq: Two times a day (BID) | ORAL | 1 refills | Status: AC

## 2019-05-26 MED ORDER — LOSARTAN POTASSIUM 100 MG PO TABS: 100.0000 mg | ORAL_TABLET | Freq: Every day | ORAL | 1 refills | Status: DC

## 2019-05-26 MED ORDER — OMEPRAZOLE 20 MG PO CPDR: 20.0000 mg | DELAYED_RELEASE_CAPSULE | Freq: Every day | ORAL | 1 refills | Status: AC

## 2019-05-26 MED ORDER — SUMATRIPTAN SUCCINATE 100 MG PO TABS: 100.0000 mg | ORAL_TABLET | ORAL | 12 refills | Status: DC | PRN

## 2019-05-26 MED ORDER — HYDRALAZINE HCL 100 MG PO TABS: 100.0000 mg | ORAL_TABLET | Freq: Three times a day (TID) | ORAL | 1 refills | Status: AC

## 2019-05-26 MED ORDER — SUMATRIPTAN SUCCINATE 100 MG PO TABS: 100.0000 mg | ORAL_TABLET | Freq: Three times a day (TID) | ORAL | 12 refills | Status: DC | PRN

## 2019-05-26 NOTE — Assessment & Plan Note (Signed)
Not under good control. Will increase her hydralazine to 100mg  TID and recheck 2-4 weeks. Call with any concerns.

## 2019-05-26 NOTE — Assessment & Plan Note (Signed)
Under good control on current regimen. Continue current regimen. Continue to monitor. Call with any concerns. Refills given.   

## 2019-05-26 NOTE — Assessment & Plan Note (Signed)
Rechecking levels today. Await results. Treat as needed.  

## 2019-05-26 NOTE — Progress Notes (Signed)
BP (!) 182/91   Pulse 65   Temp 97.9 F (36.6 C) (Oral)   Ht 5' 4.5" (1.638 m)   Wt 156 lb 6.4 oz (70.9 kg)   SpO2 97%   BMI 26.43 kg/m    Subjective:    Patient ID: Ashley [name], female    DOB: [birth year], 68 y.o.   MRN: [id number]  HPI: Ashley [name] is a 68 y.o. female presenting on 05/26/2019 for comprehensive medical examination. Current medical complaints include:  HYPERTENSION Hypertension status: uncontrolled  Satisfied with current treatment? no Duration of hypertension: chronic BP monitoring frequency:  not checking BP medication side effects:  no Medication compliance: excellent compliance Previous BP meds: hydralazine, atenolol, losartan Aspirin: no Recurrent headaches: no Visual changes: no Palpitations: no Dyspnea: no Chest pain: no Lower extremity edema: no Dizzy/lightheaded: yes  ADHD- had been on vyvanse for years, had not been on it since July 2019. Last dose was for  ADHD status: uncontrolled Satisfied with current therapy: not on anything currently Controlled substance contract: no Previous psychiatry evaluation: no Previous medications: yes vyvanse   Work/school performance:  good Difficulty sustaining attention/completing tasks: yes Distracted by extraneous stimuli: yes Does not listen when spoken to: no  Fidgets with hands or feet: no Unable to stay in seat: no Blurts out/interrupts others: no ADHD Medication Side Effects: not on anything right now    Decreased appetite: no    Headache: no    Sleeping disturbance pattern: no    Irritability: no    Rebound effects (worse than baseline) off medication: no    Anxiousness: no    Dizziness: no    Tics: no  Menopausal Symptoms: no  Depression Screen done today and results listed below:  Depression screen [facility] 2/9 05/26/2019 05/09/2018 11/14/2017 08/06/2017 12/11/2016  Decreased Interest 0 0 0 0 0  Down, Depressed, Hopeless 0 0 0 0 0  PHQ - 2 Score 0 0 0 0 0  Altered sleeping 0 1 - - -   Tired, decreased energy 0 1 - - -  Change in appetite 0 0 - - -  Feeling bad or failure about yourself  1 0 - - -  Trouble concentrating 0 0 - - -  Moving slowly or fidgety/restless 0 0 - - -  Suicidal thoughts 0 0 - - -  PHQ-9 Score 1 2 - - -  Difficult doing work/chores Not difficult at all Somewhat difficult - - -   GAD 7 : Generalized Anxiety Score 05/26/2019  Nervous, Anxious, on Edge 1  Control/stop worrying 0  Worry too much - different things 0  Trouble relaxing 0  Restless 0  Easily annoyed or irritable 0  Afraid - awful might happen 0  Total GAD 7 Score 1  Anxiety Difficulty Not difficult at all   Functional Status Survey: Is the patient deaf or have difficulty hearing?: No Does the patient have difficulty seeing, even when wearing glasses/contacts?: No Does the patient have difficulty concentrating, remembering, or making decisions?: No Does the patient have difficulty walking or climbing stairs?: No Does the patient have difficulty dressing or bathing?: No Does the patient have difficulty doing errands alone such as visiting a doctor's office or shopping?: No  Past Medical History:  Past Medical History:  Diagnosis Date  . ADHD (attention deficit hyperactivity disorder) 05/02/2015  . Anemia   . Anxiety   . Arthritis   . Attention deficit disorder   . Autoimmune liver disease   .  Barrett's esophagus   . COPD (chronic obstructive pulmonary disease) (HCC)   . Depression   . DEPRESSION, HX OF 04/21/2008   Qualifier: Diagnosis of  By: Nelson-Smith CMA (AAMA), Dottie    . Gastritis   . GERD (gastroesophageal reflux disease)   . Hypertension   . Insomnia   . Internal hemorrhoids   . Liver failure (HCC)   . Migraine   . Ovarian failure   . Peptic ulcer disease   . PEPTIC ULCER DISEASE, HX OF 04/21/2008   Qualifier: Diagnosis of  By: Nelson-Smith CMA (AAMA), Dottie    . Small bowel obstruction (HCC)   . SMALL BOWEL OBSTRUCTION, HX OF 04/21/2008   Qualifier:  Diagnosis of  By: Nelson-Smith CMA (AAMA), Dottie    . Status post right knee replacement 05/14/2018    Surgical History:  Past Surgical History:  Procedure Laterality Date  . ABDOMINAL HYSTERECTOMY    . DILATION AND CURETTAGE OF UTERUS    . LIVER BIOPSY    . OOPHORECTOMY    . TOTAL KNEE ARTHROPLASTY Right 05/14/2018   Procedure: TOTAL KNEE ARTHROPLASTY;  Surgeon: Lyndle Herrlich, MD;  Location: [facility] ORS;  Service: Orthopedics;  Laterality: Right;    Medications:  Current Outpatient Medications on File Prior to Visit  Medication Sig  . Cholecalciferol (VITAMIN D3) 5000 units CAPS Take 1 capsule by mouth daily.  . Cyanocobalamin (VITAMIN B 12 PO) Take 3,000 mcg by mouth daily.   Marland Kitchen loratadine (CLARITIN) 10 MG tablet Take 10 mg by mouth daily as needed for allergies.   . busPIRone (BUSPAR) 5 MG tablet Take 1 tablet (5 mg total) by mouth 3 (three) times daily. For anxiety (Patient not taking: Reported on 05/26/2019)  . SUMAtriptan (IMITREX) 20 MG/ACT nasal spray Place 1 spray (20 mg total) into the nose once for 1 dose. May repeat in 2 hours if headache persists or recurs.   No current facility-administered medications on file prior to visit.     Allergies:  Allergies  Allergen Reactions  . Amlodipine Swelling  . Topamax [Topiramate] Other (See Comments)    "shut my body down."  . Tylenol [Acetaminophen] Other (See Comments)    Elevated liver enzymes     Social History:  Social History   Socioeconomic History  . Marital status: Divorced    Spouse name: Not on file  . Number of children: Not on file  . Years of education: Not on file  . Highest education level: Not on file  Occupational History  . Not on file  Social Needs  . Financial resource strain: Not on file  . Food insecurity    Worry: Not on file    Inability: Not on file  . Transportation needs    Medical: Not on file    Non-medical: Not on file  Tobacco Use  . Smoking status: Former Smoker    Quit date:  11/05/1988    Years since quitting: 30.5  . Smokeless tobacco: Never Used  Substance and Sexual Activity  . Alcohol use: Yes    Alcohol/week: 7.0 standard drinks    Types: 7 Glasses of wine per week  . Drug use: No  . Sexual activity: Never  Lifestyle  . Physical activity    Days per week: Not on file    Minutes per session: Not on file  . Stress: Not on file  Relationships  . Social Musician on phone: Not on file    Gets together: Not  on file    Attends religious service: Not on file    Active member of club or organization: Not on file    Attends meetings of clubs or organizations: Not on file    Relationship status: Not on file  . Intimate partner violence    Fear of current or ex partner: Not on file    Emotionally abused: Not on file    Physically abused: Not on file    Forced sexual activity: Not on file  Other Topics Concern  . Not on file  Social History Narrative  . Not on file   Social History   Tobacco Use  Smoking Status Former Smoker  . Quit date: 11/05/1988  . Years since quitting: 30.5  Smokeless Tobacco Never Used   Social History   Substance and Sexual Activity  Alcohol Use Yes  . Alcohol/week: 7.0 standard drinks  . Types: 7 Glasses of wine per week    Family History:  Family History  Problem Relation Age of Onset  . Heart failure Father   . Atrial fibrillation Mother   . Cerebral palsy Brother   . Heart attack Maternal Grandfather   . CAD Paternal Grandmother   . Colon cancer Neg Hx     Past medical history, surgical history, medications, allergies, family history and social history reviewed with patient today and changes made to appropriate areas of the chart.   Review of Systems  Constitutional: Negative.   HENT: Positive for congestion. Negative for ear discharge, ear pain, hearing loss, nosebleeds, sinus pain, sore throat and tinnitus.   Eyes: Negative.   Respiratory: Negative.  Negative for stridor.   Cardiovascular:  Negative.   Gastrointestinal: Negative.   Genitourinary: Negative.   Musculoskeletal: Negative.   Skin: Negative.   Neurological: Positive for dizziness. Negative for tingling, tremors, sensory change, speech change, focal weakness, seizures, loss of consciousness, weakness and headaches.  Endo/Heme/Allergies: Positive for environmental allergies. Negative for polydipsia. Does not bruise/bleed easily.  Psychiatric/Behavioral: Negative.  Negative for depression, hallucinations, memory loss, substance abuse and suicidal ideas. The patient is not nervous/anxious and does not have insomnia.     All other ROS negative except what is listed above and in the HPI.      Objective:    BP (!) 182/91   Pulse 65   Temp 97.9 F (36.6 C) (Oral)   Ht 5' 4.5" (1.638 m)   Wt 156 lb 6.4 oz (70.9 kg)   SpO2 97%   BMI 26.43 kg/m   Wt Readings from Last 3 Encounters:  05/26/19 156 lb 6.4 oz (70.9 kg)  09/13/18 144 lb 6.4 oz (65.5 kg)  09/12/18 144 lb 6.4 oz (65.5 kg)    Physical Exam Vitals signs and nursing note reviewed.  Constitutional:      General: She is not in acute distress.    Appearance: Normal appearance. She is not ill-appearing, toxic-appearing or diaphoretic.  HENT:     Head: Normocephalic and atraumatic.     Right Ear: Tympanic membrane, ear canal and external ear normal. There is no impacted cerumen.     Left Ear: Tympanic membrane, ear canal and external ear normal. There is no impacted cerumen.     Nose: Nose normal. No congestion or rhinorrhea.     Mouth/Throat:     Mouth: Mucous membranes are moist.     Pharynx: Oropharynx is clear. No oropharyngeal exudate or posterior oropharyngeal erythema.  Eyes:     General: No scleral icterus.  Right eye: No discharge.        Left eye: No discharge.     Extraocular Movements: Extraocular movements intact.     Conjunctiva/sclera: Conjunctivae normal.     Pupils: Pupils are equal, round, and reactive to light.  Neck:      Musculoskeletal: Normal range of motion and neck supple. No neck rigidity or muscular tenderness.     Vascular: No carotid bruit.  Cardiovascular:     Rate and Rhythm: Normal rate and regular rhythm.     Pulses: Normal pulses.     Heart sounds: No murmur. No friction rub. No gallop.   Pulmonary:     Effort: Pulmonary effort is normal. No respiratory distress.     Breath sounds: Normal breath sounds. No stridor. No wheezing, rhonchi or rales.  Chest:     Chest wall: No tenderness.  Abdominal:     General: Abdomen is flat. Bowel sounds are normal. There is no distension.     Palpations: Abdomen is soft. There is no mass.     Tenderness: There is no abdominal tenderness. There is no right CVA tenderness, left CVA tenderness, guarding or rebound.     Hernia: No hernia is present.  Genitourinary:    Comments: Breast and pelvic exams deferred with shared decision making Musculoskeletal:        General: No swelling, tenderness, deformity or signs of injury.     Right lower leg: No edema.     Left lower leg: No edema.  Lymphadenopathy:     Cervical: No cervical adenopathy.  Skin:    General: Skin is warm and dry.     Capillary Refill: Capillary refill takes less than 2 seconds.     Coloration: Skin is not jaundiced or pale.     Findings: No bruising, erythema, lesion or rash.  Neurological:     General: No focal deficit present.     Mental Status: She is alert and oriented to person, place, and time. Mental status is at baseline.     Cranial Nerves: No cranial nerve deficit.     Sensory: No sensory deficit.     Motor: No weakness.     Coordination: Coordination normal.     Gait: Gait normal.     Deep Tendon Reflexes: Reflexes normal.  Psychiatric:        Mood and Affect: Mood normal.        Behavior: Behavior normal.        Thought Content: Thought content normal.        Judgment: Judgment normal.     Results for orders placed or performed in visit on 09/12/18  Microscopic  Examination   URINE  Result Value Ref Range   WBC, UA 0-5 0 - 5 /hpf   RBC, UA None seen 0 - 2 /hpf   Epithelial Cells (non renal) 0-10 0 - 10 /hpf   Mucus, UA Present (A) Not Estab.   Bacteria, UA None seen None seen/Few  CBC with Differential/Platelet  Result Value Ref Range   WBC 5.4 3.4 - 10.8 x10E3/uL   RBC 3.54 (L) 3.77 - 5.28 x10E6/uL   Hemoglobin 12.4 11.1 - 15.9 g/dL   Hematocrit 37.3 34.0 - 46.6 %   MCV 105 (H) 79 - 97 fL   MCH 35.0 (H) 26.6 - 33.0 pg   MCHC 33.2 31.5 - 35.7 g/dL   RDW 13.8 12.3 - 15.4 %   Platelets 317 150 - 450 x10E3/uL   Neutrophils  58 Not Estab. %   Lymphs 23 Not Estab. %   Monocytes 11 Not Estab. %   Eos 7 Not Estab. %   Basos 1 Not Estab. %   Neutrophils Absolute 3.2 1.4 - 7.0 x10E3/uL   Lymphocytes Absolute 1.3 0.7 - 3.1 x10E3/uL   Monocytes Absolute 0.6 0.1 - 0.9 x10E3/uL   EOS (ABSOLUTE) 0.4 0.0 - 0.4 x10E3/uL   Basophils Absolute 0.1 0.0 - 0.2 x10E3/uL   Immature Granulocytes 0 Not Estab. %   Immature Grans (Abs) 0.0 0.0 - 0.1 x10E3/uL  Comprehensive metabolic panel  Result Value Ref Range   Glucose 84 65 - 99 mg/dL   BUN 19 8 - 27 mg/dL   Creatinine, Ser 1.610.87 0.57 - 1.00 mg/dL   GFR calc non Af Amer 70 >59 mL/min/1.73   GFR calc Af Amer 81 >59 mL/min/1.73   BUN/Creatinine Ratio 22 12 - 28   Sodium 141 134 - 144 mmol/L   Potassium 6.3 (H) 3.5 - 5.2 mmol/L   Chloride 105 96 - 106 mmol/L   CO2 20 20 - 29 mmol/L   Calcium 9.6 8.7 - 10.3 mg/dL   Total Protein 7.2 6.0 - 8.5 g/dL   Albumin 4.2 3.6 - 4.8 g/dL   Globulin, Total 3.0 1.5 - 4.5 g/dL   Albumin/Globulin Ratio 1.4 1.2 - 2.2   Bilirubin Total <0.2 0.0 - 1.2 mg/dL   Alkaline Phosphatase 74 39 - 117 IU/L   AST 20 0 - 40 IU/L   ALT 17 0 - 32 IU/L  Lipid Panel w/o Chol/HDL Ratio  Result Value Ref Range   Cholesterol, Total 141 100 - 199 mg/dL   Triglycerides 93 0 - 149 mg/dL   HDL 66 >09>39 mg/dL   VLDL Cholesterol Cal 19 5 - 40 mg/dL   LDL Calculated 56 0 - 99 mg/dL   Microalbumin, Urine Waived  Result Value Ref Range   Microalb, Ur Waived 80 (H) 0 - 19 mg/L   Creatinine, Urine Waived 300 10 - 300 mg/dL   Microalb/Creat Ratio 30-300 (H) <30 mg/g  TSH  Result Value Ref Range   TSH 2.090 0.450 - 4.500 uIU/mL  UA/M w/rflx Culture, Routine   Specimen: Urine   URINE  Result Value Ref Range   Specific Gravity, UA 1.020 1.005 - 1.030   pH, UA 5.0 5.0 - 7.5   Color, UA Yellow Yellow   Appearance Ur Clear Clear   Leukocytes, UA Negative Negative   Protein, UA Negative Negative/Trace   Glucose, UA Negative Negative   Ketones, UA Trace (A) Negative   RBC, UA Negative Negative   Bilirubin, UA Negative Negative   Urobilinogen, Ur 0.2 0.2 - 1.0 mg/dL   Nitrite, UA Negative Negative   Microscopic Examination See below:   B12 and Folate Panel  Result Value Ref Range   Vitamin B-12 >2000 (H) 232 - 1245 pg/mL   Folate 8.1 >3.0 ng/mL      Assessment & Plan:   Problem List Items Addressed This Visit      Cardiovascular and Mediastinum   Benign essential HTN    Not under good control. Will increase her hydralazine to 100mg  TID and recheck 2-4 weeks. Call with any concerns.       Relevant Medications   atenolol (TENORMIN) 100 MG tablet   hydrALAZINE (APRESOLINE) 100 MG tablet   losartan (COZAAR) 100 MG tablet   Other Relevant Orders   Comprehensive metabolic panel   Microalbumin, Urine Waived  TSH   UA/M w/rflx Culture, Routine   Migraine with aura    Under good control on current regimen. Continue current regimen. Continue to monitor. Call with any concerns. Refills given.        Relevant Medications   atenolol (TENORMIN) 100 MG tablet   hydrALAZINE (APRESOLINE) 100 MG tablet   losartan (COZAAR) 100 MG tablet   SUMAtriptan (IMITREX) 100 MG tablet   Other Relevant Orders   TSH     Respiratory   COPD (chronic obstructive pulmonary disease) (HCC)    Under good control on current regimen. Continue current regimen. Continue to monitor. Call  with any concerns. Refills given.          Other   ADHD (attention deficit hyperactivity disorder)    PMP reviewed today and appropriate. Has not had her vyvanse in about a year. BP 182/91 today. Not starting stimulants with uncontrolled BP. Will get BP under better control, then will restart vyvanse.      Vitamin B12 deficiency    Rechecking levels today. Await results. Treat as needed.       Relevant Orders   B12 and Folate Panel   Anemia, macrocytic    Rechecking levels today. Await results. Treat as needed.       Relevant Orders   CBC with Differential/Platelet   Iron and TIBC   Ferritin   B12 and Folate Panel    Other Visit Diagnoses    Routine general medical examination at a health care facility    -  Primary   Vaccines up to date. Screening labs checked today. Pap done. Mammogram and DEXA ordered. Colonoscopy ordered. Continue diet and exercise. Call with concerns.   Relevant Orders   Lipid Panel w/o Chol/HDL Ratio   Screening for breast cancer       Mammogram ordered today.   Relevant Orders   MM DIGITAL SCREENING BILATERAL   Screening for colon cancer       Referral to GI made today.   Relevant Orders   Ambulatory referral to Gastroenterology   Screening for osteoporosis       DEXA ordered. Await results.    Relevant Orders   DG Bone Density       Follow up plan: Return 2-4 weeks, for follow up BP.   LABORATORY TESTING:  - Pap smear: not applicable  IMMUNIZATIONS:   - Tdap: Tetanus vaccination status reviewed: due and given today. - Influenza: Postponed to flu season - Pneumovax: Not applicable - Prevnar: will get in 2 weeks  SCREENING: -Mammogram: Ordered today  - Colonoscopy: Ordered today  - Bone Density: Ordered today   PATIENT COUNSELING:   Advised to take 1 mg of folate supplement per day if capable of pregnancy.   Sexuality: Discussed sexually transmitted diseases, partner selection, use of condoms, avoidance of unintended pregnancy   and contraceptive alternatives.   Advised to avoid cigarette smoking.  I discussed with the patient that most people either abstain from alcohol or drink within safe limits (<=14/week and <=4 drinks/occasion for males, <=7/weeks and <= 3 drinks/occasion for females) and that the risk for alcohol disorders and other health effects rises proportionally with the number of drinks per week and how often a drinker exceeds daily limits.  Discussed cessation/primary prevention of drug use and availability of treatment for abuse.   Diet: Encouraged to adjust caloric intake to maintain  or achieve ideal body weight, to reduce intake of dietary saturated fat and total fat, to limit sodium  intake by avoiding high sodium foods and not adding table salt, and to maintain adequate dietary potassium and calcium preferably from fresh fruits, vegetables, and low-fat dairy products.    stressed the importance of regular exercise  Injury prevention: Discussed safety belts, safety helmets, smoke detector, smoking near bedding or upholstery.   Dental health: Discussed importance of regular tooth brushing, flossing, and dental visits.    NEXT PREVENTATIVE PHYSICAL DUE IN 1 YEAR. Return 2-4 weeks, for follow up BP.

## 2019-05-26 NOTE — Telephone Encounter (Signed)
Copied from [place] [id number]. Topic: General - Other >> May 26, 2019  2:53 PM Leward Quan A wrote: Reason for CRM: [place], [place] - [phone] (Phone) called to get clarification on Rx sent in for patient for SUMAtriptan (IMITREX) 100 MG tablet asking for clarification on directions Please advise

## 2019-05-26 NOTE — Patient Instructions (Addendum)
Call to schedule your mammogram and bone density:  Ashley [name]  Address: [address], [place], [place] [address]  Phone: [phone]   Health Maintenance After Age 66 After age 66, you are at a higher risk for certain long-term diseases and infections as well as injuries from falls. Falls are a major cause of broken bones and head injuries in people who are older than age 66. Getting regular preventive care can help to keep you healthy and well. Preventive care includes getting regular testing and making lifestyle changes as recommended by your health care provider. Talk with your health care provider about:  Which screenings and tests you should have. A screening is a test that checks for a disease when you have no symptoms.  A diet and exercise plan that is right for you. What should I know about screenings and tests to prevent falls? Screening and testing are the best ways to find a health problem early. Early diagnosis and treatment give you the best chance of managing medical conditions that are common after age 66. Certain conditions and lifestyle choices may make you more likely to have a fall. Your health care provider may recommend:  Regular vision checks. Poor vision and conditions such as cataracts can make you more likely to have a fall. If you wear glasses, make sure to get your prescription updated if your vision changes.  Medicine review. Work with your health care provider to regularly review all of the medicines you are taking, including over-the-counter medicines. Ask your health care provider about any side effects that may make you more likely to have a fall. Tell your health care provider if any medicines that you take make you feel dizzy or sleepy.  Osteoporosis screening. Osteoporosis is a condition that causes the bones to get weaker. This can make the bones weak and cause them to break more easily.  Blood pressure screening.  Blood pressure changes and medicines to control blood pressure can make you feel dizzy.  Strength and balance checks. Your health care provider may recommend certain tests to check your strength and balance while standing, walking, or changing positions.  Foot health exam. Foot pain and numbness, as well as not wearing proper footwear, can make you more likely to have a fall.  Depression screening. You may be more likely to have a fall if you have a fear of falling, feel emotionally low, or feel unable to do activities that you used to do.  Alcohol use screening. Using too much alcohol can affect your balance and may make you more likely to have a fall. What actions can I take to lower my risk of falls? General instructions  Talk with your health care provider about your risks for falling. Tell your health care provider if: ? You fall. Be sure to tell your health care provider about all falls, even ones that seem minor. ? You feel dizzy, sleepy, or off-balance.  Take over-the-counter and prescription medicines only as told by your health care provider. These include any supplements.  Eat a healthy diet and maintain a healthy weight. A healthy diet includes low-fat dairy products, low-fat (lean) meats, and fiber from whole grains, beans, and lots of fruits and vegetables. Home safety  Remove any tripping hazards, such as rugs, cords, and clutter.  Install safety equipment such as grab bars in bathrooms and safety rails on stairs.  Keep rooms and walkways well-lit. Activity   Follow a regular exercise  program to stay fit. This will help you maintain your balance. Ask your health care provider what types of exercise are appropriate for you.  If you need a cane or walker, use it as recommended by your health care provider.  Wear supportive shoes that have nonskid soles. Lifestyle  Do not drink alcohol if your health care provider tells you not to drink.  If you drink alcohol, limit  how much you have: ? 0-1 drink a day for women. ? 0-2 drinks a day for men.  Be aware of how much alcohol is in your drink. In the [place] one drink equals one typical bottle of beer (12 oz), one-half glass of wine (5 oz), or one shot of hard liquor (1 oz).  Do not use any products that contain nicotine or tobacco, such as cigarettes and e-cigarettes. If you need help quitting, ask your health care provider. Summary  Having a healthy lifestyle and getting preventive care can help to protect your health and wellness after age 66.  Screening and testing are the best way to find a health problem early and help you avoid having a fall. Early diagnosis and treatment give you the best chance for managing medical conditions that are more common for people who are older than age 66.  Falls are a major cause of broken bones and head injuries in people who are older than age 66. Take precautions to prevent a fall at home.  Work with your health care provider to learn what changes you can make to improve your health and wellness and to prevent falls. This information is not intended to replace advice given to you by your health care provider. Make sure you discuss any questions you have with your health care provider. Document Released: 09/04/2017 Document Revised: 02/12/2019 Document Reviewed: 09/04/2017 Elsevier Patient Education  2020 [employer] https://www.cdc.gov/vaccines/hcp/vis/vis-statements/tdap.pdf">  Tdap Vaccine (Tetanus, Diphtheria and Pertussis): What You Need to Know 1. Why get vaccinated? Tetanus, diphtheria and pertussis are very serious diseases. Tdap vaccine can protect us from these diseases. And, Tdap vaccine given to pregnant women can protect newborn babies against pertussis.Marland Kitchen. TETANUS (Lockjaw) is rare in the [place] today. It causes painful muscle tightening and stiffness, usually all over the body.  It can lead to tightening of muscles in the head and neck so you can't  open your mouth, swallow, or sometimes even breathe. Tetanus kills about 1 out of 10 people who are infected even after receiving the best medical care. DIPHTHERIA is also rare in the [place] today. It can cause a thick coating to form in the back of the throat.  It can lead to breathing problems, heart failure, paralysis, and death. PERTUSSIS (Whooping Cough) causes severe coughing spells, which can cause difficulty breathing, vomiting and disturbed sleep.  It can also lead to weight loss, incontinence, and rib fractures. Up to 2 in 100 adolescents and 5 in 100 adults with pertussis are hospitalized or have complications, which could include pneumonia or death. These diseases are caused by bacteria. Diphtheria and pertussis are spread from person to person through secretions from coughing or sneezing. Tetanus enters the body through cuts, scratches, or wounds. Before vaccines, as many as 200,000 cases of diphtheria, 200,000 cases of pertussis, and hundreds of cases of tetanus, were reported in the [place] each year. Since vaccination began, reports of cases for tetanus and diphtheria have dropped by about 99% and for pertussis by about 80%. 2. Tdap vaccine Tdap vaccine can protect  adolescents and adults from tetanus, diphtheria, and pertussis. One dose of Tdap is routinely given at age 68 or 58. People who did not get Tdap at that age should get it as soon as possible. Tdap is especially important for healthcare professionals and anyone having close contact with a baby younger than 12 months. Pregnant women should get a dose of Tdap during every pregnancy, to protect the newborn from pertussis. Infants are most at risk for severe, life-threatening complications from pertussis. Another vaccine, called Td, protects against tetanus and diphtheria, but not pertussis. A Td booster should be given every 10 years. Tdap may be given as one of these boosters if you have never gotten Tdap before.  Tdap may also be given after a severe cut or burn to prevent tetanus infection. Your doctor or the person giving you the vaccine can give you more information. Tdap may safely be given at the same time as other vaccines. 3. Some people should not get this vaccine  A person who has ever had a life-threatening allergic reaction after a previous dose of any diphtheria, tetanus or pertussis containing vaccine, OR has a severe allergy to any part of this vaccine, should not get Tdap vaccine. Tell the person giving the vaccine about any severe allergies.  Anyone who had coma or long repeated seizures within 7 days after a childhood dose of DTP or DTaP, or a previous dose of Tdap, should not get Tdap, unless a cause other than the vaccine was found. They can still get Td.  Talk to your doctor if you: ? have seizures or another nervous system problem, ? had severe pain or swelling after any vaccine containing diphtheria, tetanus or pertussis, ? ever had a condition called Guillain-Barr Syndrome (GBS), ? aren't feeling well on the day the shot is scheduled. 4. Risks With any medicine, including vaccines, there is a chance of side effects. These are usually mild and go away on their own. Serious reactions are also possible but are rare. Most people who get Tdap vaccine do not have any problems with it. Mild problems following Tdap (Did not interfere with activities)  Pain where the shot was given (about 3 in 4 adolescents or 2 in 3 adults)  Redness or swelling where the shot was given (about 1 person in 5)  Mild fever of at least 100.78F (up to about 1 in 25 adolescents or 1 in 100 adults)  Headache (about 3 or 4 people in 10)  Tiredness (about 1 person in 3 or 4)  Nausea, vomiting, diarrhea, stomach ache (up to 1 in 4 adolescents or 1 in 10 adults)  Chills, sore joints (about 1 person in 10)  Body aches (about 1 person in 3 or 4)  Rash, swollen glands (uncommon) Moderate problems  following Tdap (Interfered with activities, but did not require medical attention)  Pain where the shot was given (up to 1 in 5 or 6)  Redness or swelling where the shot was given (up to about 1 in 16 adolescents or 1 in 12 adults)  Fever over 102F (about 1 in 100 adolescents or 1 in 250 adults)  Headache (about 1 in 7 adolescents or 1 in 10 adults)  Nausea, vomiting, diarrhea, stomach ache (up to 1 or 3 people in 100)  Swelling of the entire arm where the shot was given (up to about 1 in 500). Severe problems following Tdap (Unable to perform usual activities; required medical attention)  Swelling, severe pain, bleeding and redness  in the arm where the shot was given (rare). Problems that could happen after any vaccine:  People sometimes faint after a medical procedure, including vaccination. Sitting or lying down for about 15 minutes can help prevent fainting, and injuries caused by a fall. Tell your doctor if you feel dizzy, or have vision changes or ringing in the ears.  Some people get severe pain in the shoulder and have difficulty moving the arm where a shot was given. This happens very rarely.  Any medication can cause a severe allergic reaction. Such reactions from a vaccine are very rare, estimated at fewer than 1 in a million doses, and would happen within a few minutes to a few hours after the vaccination. As with any medicine, there is a very remote chance of a vaccine causing a serious injury or death. The safety of vaccines is always being monitored. For more information, visit: http://floyd.org/ 5. What if there is a serious problem? What should I look for?  Look for anything that concerns you, such as signs of a severe allergic reaction, very high fever, or unusual behavior. Signs of a severe allergic reaction can include hives, swelling of the face and throat, difficulty breathing, a fast heartbeat, dizziness, and weakness. These would usually start a few  minutes to a few hours after the vaccination. What should I do?  If you think it is a severe allergic reaction or other emergency that can't wait, call 9-1-1 or get the person to the nearest hospital. Otherwise, call your doctor.  Afterward, the reaction should be reported to the Vaccine Adverse Event Reporting System (VAERS). Your doctor might file this report, or you can do it yourself through the VAERS web site at www.vaers.LAgents.no, or by calling 1-531-294-4972. VAERS does not give medical advice. 6. The National Vaccine Injury Compensation Program The [employer] Vaccine Injury Compensation Program (VICP) is a federal program that was created to compensate people who may have been injured by certain vaccines. Persons who believe they may have been injured by a vaccine can learn about the program and about filing a claim by calling 1-(236)274-6659 or visiting the VICP website at SpiritualWord.at. There is a time limit to file a claim for compensation. 7. How can I learn more?  Ask your doctor. He or she can give you the vaccine package insert or suggest other sources of information.  Call your local or state health department.  Contact the Centers for Disease Control and Prevention (CDC): ? Call (585)002-7132 (1-800-CDC-INFO) or ? Visit CDC's website at PicCapture.uy Vaccine Information Statement Tdap Vaccine (12/29/2013) This information is not intended to replace advice given to you by your health care provider. Make sure you discuss any questions you have with your health care provider. Document Released: 04/22/2012 Document Revised: 06/09/2018 Document Reviewed: 06/09/2018 Elsevier Interactive Patient Education  2020 [employer]. https://www.cdc.gov/vaccines/hcp/vis/vis-statements/tdap.pdf">  Tdap Vaccine (Tetanus, Diphtheria and Pertussis): What You Need to Know 1. Why get vaccinated? Tetanus, diphtheria and pertussis are very serious diseases. Tdap vaccine can  protect [place] from these diseases. And, Tdap vaccine given to pregnant women can protect newborn babies against pertussis.Marland Kitchen TETANUS (Lockjaw) is rare in the [place] today. It causes painful muscle tightening and stiffness, usually all over the body.  It can lead to tightening of muscles in the head and neck so you can't open your mouth, swallow, or sometimes even breathe. Tetanus kills about 1 out of 10 people who are infected even after receiving the best medical care. DIPHTHERIA is also  rare in the [place] today. It can cause a thick coating to form in the back of the throat.  It can lead to breathing problems, heart failure, paralysis, and death. PERTUSSIS (Whooping Cough) causes severe coughing spells, which can cause difficulty breathing, vomiting and disturbed sleep.  It can also lead to weight loss, incontinence, and rib fractures. Up to 2 in 100 adolescents and 5 in 100 adults with pertussis are hospitalized or have complications, which could include pneumonia or death. These diseases are caused by bacteria. Diphtheria and pertussis are spread from person to person through secretions from coughing or sneezing. Tetanus enters the body through cuts, scratches, or wounds. Before vaccines, as many as 200,000 cases of diphtheria, 200,000 cases of pertussis, and hundreds of cases of tetanus, were reported in the [place] each year. Since vaccination began, reports of cases for tetanus and diphtheria have dropped by about 99% and for pertussis by about 80%. 2. Tdap vaccine Tdap vaccine can protect adolescents and adults from tetanus, diphtheria, and pertussis. One dose of Tdap is routinely given at age 70 or 50. People who did not get Tdap at that age should get it as soon as possible. Tdap is especially important for healthcare professionals and anyone having close contact with a baby younger than 12 months. Pregnant women should get a dose of Tdap during every pregnancy, to protect  the newborn from pertussis. Infants are most at risk for severe, life-threatening complications from pertussis. Another vaccine, called Td, protects against tetanus and diphtheria, but not pertussis. A Td booster should be given every 10 years. Tdap may be given as one of these boosters if you have never gotten Tdap before. Tdap may also be given after a severe cut or burn to prevent tetanus infection. Your doctor or the person giving you the vaccine can give you more information. Tdap may safely be given at the same time as other vaccines. 3. Some people should not get this vaccine  A person who has ever had a life-threatening allergic reaction after a previous dose of any diphtheria, tetanus or pertussis containing vaccine, OR has a severe allergy to any part of this vaccine, should not get Tdap vaccine. Tell the person giving the vaccine about any severe allergies.  Anyone who had coma or long repeated seizures within 7 days after a childhood dose of DTP or DTaP, or a previous dose of Tdap, should not get Tdap, unless a cause other than the vaccine was found. They can still get Td.  Talk to your doctor if you: ? have seizures or another nervous system problem, ? had severe pain or swelling after any vaccine containing diphtheria, tetanus or pertussis, ? ever had a condition called Guillain-Barr Syndrome (GBS), ? aren't feeling well on the day the shot is scheduled. 4. Risks With any medicine, including vaccines, there is a chance of side effects. These are usually mild and go away on their own. Serious reactions are also possible but are rare. Most people who get Tdap vaccine do not have any problems with it. Mild problems following Tdap (Did not interfere with activities)  Pain where the shot was given (about 3 in 4 adolescents or 2 in 3 adults)  Redness or swelling where the shot was given (about 1 person in 5)  Mild fever of at least 100.8F (up to about 1 in 25 adolescents or 1 in  100 adults)  Headache (about 3 or 4 people in 10)  Tiredness (about 1 person  in 3 or 4)  Nausea, vomiting, diarrhea, stomach ache (up to 1 in 4 adolescents or 1 in 10 adults)  Chills, sore joints (about 1 person in 10)  Body aches (about 1 person in 3 or 4)  Rash, swollen glands (uncommon) Moderate problems following Tdap (Interfered with activities, but did not require medical attention)  Pain where the shot was given (up to 1 in 5 or 6)  Redness or swelling where the shot was given (up to about 1 in 16 adolescents or 1 in 12 adults)  Fever over 102F (about 1 in 100 adolescents or 1 in 250 adults)  Headache (about 1 in 7 adolescents or 1 in 10 adults)  Nausea, vomiting, diarrhea, stomach ache (up to 1 or 3 people in 100)  Swelling of the entire arm where the shot was given (up to about 1 in 500). Severe problems following Tdap (Unable to perform usual activities; required medical attention)  Swelling, severe pain, bleeding and redness in the arm where the shot was given (rare). Problems that could happen after any vaccine:  People sometimes faint after a medical procedure, including vaccination. Sitting or lying down for about 15 minutes can help prevent fainting, and injuries caused by a fall. Tell your doctor if you feel dizzy, or have vision changes or ringing in the ears.  Some people get severe pain in the shoulder and have difficulty moving the arm where a shot was given. This happens very rarely.  Any medication can cause a severe allergic reaction. Such reactions from a vaccine are very rare, estimated at fewer than 1 in a million doses, and would happen within a few minutes to a few hours after the vaccination. As with any medicine, there is a very remote chance of a vaccine causing a serious injury or death. The safety of vaccines is always being monitored. For more information, visit: http://floyd.org/www.cdc.gov/vaccinesafety/ 5. What if there is a serious problem? What should I  look for?  Look for anything that concerns you, such as signs of a severe allergic reaction, very high fever, or unusual behavior. Signs of a severe allergic reaction can include hives, swelling of the face and throat, difficulty breathing, a fast heartbeat, dizziness, and weakness. These would usually start a few minutes to a few hours after the vaccination. What should I do?  If you think it is a severe allergic reaction or other emergency that can't wait, call 9-1-1 or get the person to the nearest hospital. Otherwise, call your doctor.  Afterward, the reaction should be reported to the Vaccine Adverse Event Reporting System (VAERS). Your doctor might file this report, or you can do it yourself through the VAERS web site at www.vaers.LAgents.nohhs.gov, or by calling 1-952-335-1181. VAERS does not give medical advice. 6. The National Vaccine Injury Compensation Program The [employer] Vaccine Injury Compensation Program (VICP) is a federal program that was created to compensate people who may have been injured by certain vaccines. Persons who believe they may have been injured by a vaccine can learn about the program and about filing a claim by calling 1-254-505-9547 or visiting the VICP website at SpiritualWord.atwww.hrsa.gov/vaccinecompensation. There is a time limit to file a claim for compensation. 7. How can I learn more?  Ask your doctor. He or she can give you the vaccine package insert or suggest other sources of information.  Call your local or state health department.  Contact the Centers for Disease Control and Prevention (CDC): ? Call 979-075-36381-541-550-5357 (1-800-CDC-INFO) or ? Visit  CDC's website at PicCapture.uy Vaccine Information Statement Tdap Vaccine (12/29/2013) This information is not intended to replace advice given to you by your health care provider. Make sure you discuss any questions you have with your health care provider. Document Released: 04/22/2012 Document Revised: 06/09/2018 Document  Reviewed: 06/09/2018 Elsevier Interactive Patient Education  [employer].

## 2019-05-26 NOTE — Assessment & Plan Note (Addendum)
PMP reviewed today and appropriate. Has not had her vyvanse in about a year. BP 182/91 today. Not starting stimulants with uncontrolled BP. Will get BP under better control, then will restart vyvanse.

## 2019-05-27 LAB — CBC WITH DIFFERENTIAL/PLATELET
Basophils Absolute: 0.1 10*3/uL (ref 0.0–0.2)
Basos: 1 %
EOS (ABSOLUTE): 0.2 10*3/uL (ref 0.0–0.4)
Eos: 4 %
Hematocrit: 37.7 % (ref 34.0–46.6)
Hemoglobin: 11.9 g/dL (ref 11.1–15.9)
Immature Grans (Abs): 0 10*3/uL (ref 0.0–0.1)
Immature Granulocytes: 0 %
Lymphocytes Absolute: 1 10*3/uL (ref 0.7–3.1)
Lymphs: 23 %
MCH: 34.8 pg — ABNORMAL HIGH (ref 26.6–33.0)
MCHC: 31.6 g/dL (ref 31.5–35.7)
MCV: 110 fL — ABNORMAL HIGH (ref 79–97)
Monocytes Absolute: 0.5 10*3/uL (ref 0.1–0.9)
Monocytes: 11 %
Neutrophils Absolute: 2.7 10*3/uL (ref 1.4–7.0)
Neutrophils: 61 %
Platelets: 264 10*3/uL (ref 150–450)
RBC: 3.42 x10E6/uL — ABNORMAL LOW (ref 3.77–5.28)
RDW: 13.7 % (ref 11.7–15.4)
WBC: 4.3 10*3/uL (ref 3.4–10.8)

## 2019-05-27 LAB — COMPREHENSIVE METABOLIC PANEL
ALT: 20 IU/L (ref 0–32)
AST: 21 IU/L (ref 0–40)
Albumin/Globulin Ratio: 1.6 (ref 1.2–2.2)
Albumin: 4.7 g/dL (ref 3.8–4.8)
Alkaline Phosphatase: 75 IU/L (ref 39–117)
BUN/Creatinine Ratio: 26 (ref 12–28)
BUN: 43 mg/dL — ABNORMAL HIGH (ref 8–27)
Bilirubin Total: 0.2 mg/dL (ref 0.0–1.2)
CO2: 20 mmol/L (ref 20–29)
Calcium: 10.2 mg/dL (ref 8.7–10.3)
Chloride: 106 mmol/L (ref 96–106)
Creatinine, Ser: 1.66 mg/dL — ABNORMAL HIGH (ref 0.57–1.00)
GFR calc Af Amer: 37 mL/min/{1.73_m2} — ABNORMAL LOW (ref >59)
GFR calc non Af Amer: 32 mL/min/{1.73_m2} — ABNORMAL LOW (ref >59)
Globulin, Total: 3 g/dL (ref 1.5–4.5)
Glucose: 97 mg/dL (ref 65–99)
Potassium: 5.9 mmol/L — ABNORMAL HIGH (ref 3.5–5.2)
Sodium: 142 mmol/L (ref 134–144)
Total Protein: 7.7 g/dL (ref 6.0–8.5)

## 2019-05-27 LAB — LIPID PANEL W/O CHOL/HDL RATIO
Cholesterol, Total: 218 mg/dL — ABNORMAL HIGH (ref 100–199)
HDL: 117 mg/dL (ref >39)
LDL Calculated: 73 mg/dL (ref 0–99)
Triglycerides: 139 mg/dL (ref 0–149)
VLDL Cholesterol Cal: 28 mg/dL (ref 5–40)

## 2019-05-27 LAB — IRON AND TIBC
Iron Saturation: 46 % (ref 15–55)
Iron: 129 ug/dL (ref 27–139)
Total Iron Binding Capacity: 281 ug/dL (ref 250–450)
UIBC: 152 ug/dL (ref 118–369)

## 2019-05-27 LAB — TSH: TSH: 2.11 u[IU]/mL (ref 0.450–4.500)

## 2019-05-27 LAB — FERRITIN: Ferritin: 254 ng/mL — ABNORMAL HIGH (ref 15–150)

## 2019-05-27 LAB — B12 AND FOLATE PANEL
Folate: 10.4 ng/mL (ref >3.0)
Vitamin B-12: >2000 pg/mL — ABNORMAL HIGH (ref 232–1245)

## 2019-05-29 LAB — UA/M W/RFLX CULTURE, ROUTINE
Bilirubin, UA: NEGATIVE
Glucose, UA: NEGATIVE
Ketones, UA: NEGATIVE
Nitrite, UA: POSITIVE — AB
RBC, UA: NEGATIVE
Specific Gravity, UA: 1.02 (ref 1.005–1.030)
Urobilinogen, Ur: 0.2 mg/dL (ref 0.2–1.0)
pH, UA: 5.5 (ref 5.0–7.5)

## 2019-05-29 LAB — MICROALBUMIN, URINE WAIVED
Creatinine, Urine Waived: 50 mg/dL (ref 10–300)
Microalb, Ur Waived: 80 mg/L — ABNORMAL HIGH (ref 0–19)

## 2019-05-29 LAB — MICROSCOPIC EXAMINATION
RBC: NONE SEEN /hpf (ref 0–2)
WBC, UA: >30 /hpf — AB (ref 0–5)

## 2019-05-29 LAB — URINE CULTURE, REFLEX

## 2019-06-07 ENCOUNTER — Telehealth: Payer: Self-pay | Admitting: Family Medicine

## 2019-06-07 NOTE — Telephone Encounter (Signed)
Please let her know that her labs look stable. Thanks!

## 2019-06-08 NOTE — Telephone Encounter (Signed)
Called and left patient a VM letting her know about results (DPR verified).

## 2019-06-11 ENCOUNTER — Encounter: Payer: Self-pay | Admitting: Family Medicine

## 2019-06-11 ENCOUNTER — Encounter: Payer: Self-pay | Admitting: *Deleted

## 2019-06-11 ENCOUNTER — Ambulatory Visit (INDEPENDENT_AMBULATORY_CARE_PROVIDER_SITE_OTHER): Payer: 59 | Admitting: Family Medicine

## 2019-06-11 ENCOUNTER — Other Ambulatory Visit: Payer: Self-pay

## 2019-06-11 VITALS — Ht 64.5 in | Wt 156.0 lb

## 2019-06-11 DIAGNOSIS — Z5321 Procedure and treatment not carried out due to patient leaving prior to being seen by health care provider: Secondary | ICD-10-CM

## 2019-06-11 NOTE — Progress Notes (Signed)
Called patient to do virtual visit. She did not pick up her BP cuff. No way to get BP. Will cancel appointment and reschedule it for in person when BP can be checked.

## 2019-06-16 ENCOUNTER — Ambulatory Visit: Payer: Self-pay | Admitting: Family Medicine

## 2019-06-16 NOTE — Telephone Encounter (Signed)
Pt reports BP few minutes prior to call 174/110 with home monitor she just purchased. While at pharmacy, pharmacist checked manually, 182/108. Reports Hydralazine increased from 70m to 1039m7/21/2020. Pt just got home  monitor kit, no other recent values. Denies any symptoms; no headache, no dizziness or CP, no weakness. Pt states dose of hydralazine and atenolol due now. Pt VERY anxious. Advised to rest, relax.  Will CB to see BP values.  CB 40 minutes later.  Pt states BP 215/192. . States she does not think she is using BP monitor correctly; cuff was still inflated.  Instructed step by step on use, BP 196/120   HR 73.  Denies any symptoms. States she wants to go back to pharmacy to have rechecked manually. Advised to CB with values. Instructed to go to ED for any weakness, dysphagia, facial droop, one sided weakness. Verbalizes understanding.   Pt called back after checking manually at pharmacy; 178/100. No symptoms. Assured pt [place] would alert practice for Dr. JoDurenda Ageeview. Reiterated symptoms that will warrant ED eval.   Please advise: [place] Reason for Disposition . Systolic BP  >= 18952R Diastolic >= 11841Answer Assessment - Initial Assessment Questions 1. BLOOD PRESSURE: "What is the blood pressure?" "Did you take at least two measurements 5 minutes apart?"     182/108 manually   174/110 home monitor 2. ONSET: "When did you take your blood pressure?"    Few minutes ago 3. HOW: "How did you obtain the blood pressure?" (e.g., visiting nurse, automatic home BP monitor)     Home monitor, then pharmacist. 4. HISTORY: "Do you have a history of high blood pressure?"     yes 5. MEDICATIONS: "Are you taking any medications for blood pressure?" "Have you missed any doses recently?"     No. Hydralazine increased from 5059mo 100m66m OTHER SYMPTOMS: "Do you have any symptoms?" (e.g., headache, chest pain, blurred vision, difficulty breathing, weakness)    No other  symptoms  Protocols used: HIGH BLOOD PRESSURE-A-AH

## 2019-06-17 ENCOUNTER — Ambulatory Visit: Payer: Self-pay

## 2019-06-17 ENCOUNTER — Emergency Department
Admission: EM | Admit: 2019-06-17 | Discharge: 2019-06-17 | Disposition: A | Discharge status: Home / Self Care | Payer: 59 | Attending: Emergency Medicine | Admitting: Emergency Medicine

## 2019-06-17 ENCOUNTER — Encounter: Payer: Self-pay | Admitting: *Deleted

## 2019-06-17 ENCOUNTER — Other Ambulatory Visit: Payer: Self-pay

## 2019-06-17 DIAGNOSIS — I10 Essential (primary) hypertension: Secondary | ICD-10-CM | POA: Diagnosis not present

## 2019-06-17 DIAGNOSIS — Z5321 Procedure and treatment not carried out due to patient leaving prior to being seen by health care provider: Secondary | ICD-10-CM | POA: Diagnosis not present

## 2019-06-17 NOTE — Telephone Encounter (Signed)
Patient called stating that for several months she has had high BP that Dr Wynetta Emery has been treating with a variety of medications. She states that they are not working and she is scared. She has not checked her BP today but yesterday it was 180/110 and then she took her medications and about one hour later it was 170/100. She took it at the pharmacy and is not able to recheck for me now. She states that her chest has felt "funny" today. She feels that it is anxiety. Per protocol patient will go to ER for evaluation. Care advice read to patient.  She will have someone drive her. Note will be routed to office for follow-up.  Reason for Disposition . [7] Systolic BP  >= 322 OR Diastolic >= 025 AND [4] cardiac or neurologic symptoms (e.g., chest pain, difficulty breathing, unsteady gait, blurred vision)  Answer Assessment - Initial Assessment Questions 1. BLOOD PRESSURE: "What is the blood pressure?" "Did you take at least two measurements 5 minutes apart?"     180/110  Then 170/100 yesterday 2. ONSET: "When did you take your blood pressure?"     Several months 3. HOW: "How did you obtain the blood pressure?" (e.g., visiting nurse, automatic home BP monitor)     Drug store monitor 4. HISTORY: "Do you have a history of high blood pressure?"     yes 5. MEDICATIONS: "Are you taking any medications for blood pressure?" "Have you missed any doses recently?"     no 6. OTHER SYMPTOMS: "Do you have any symptoms?" (e.g., headache, chest pain, blurred vision, difficulty breathing, weakness)    Anxious with chest feeling funny 7. PREGNANCY: "Is there any chance you are pregnant?" "When was your last menstrual period?"     N/A  Protocols used: HIGH BLOOD PRESSURE-A-AH

## 2019-06-17 NOTE — Telephone Encounter (Signed)
Looks like she's scheduled to be seen Friday, OK to move sooner if needed.

## 2019-06-17 NOTE — Telephone Encounter (Signed)
Called pt to let her know of provider's message, no answer, left vm

## 2019-06-17 NOTE — ED Triage Notes (Signed)
Pt reports high blood pressure today.  Pt taking bp meds today.  Pt alert  Speech clear.

## 2019-06-17 NOTE — ED Notes (Signed)
In triage, pt decided not to be seen and has appt with her doctor tomorrow.  Pt alert.

## 2019-06-18 NOTE — Telephone Encounter (Signed)
Noted  

## 2019-06-18 NOTE — Telephone Encounter (Signed)
Called pt multiple times, no answer left voicemails to call us back. Per Apolonio Schneiders pt is schedule for tomorrow but can reschedule to be seen earlier if she would like.

## 2019-06-18 NOTE — Telephone Encounter (Signed)
Called pt back she went to be seen last night and states her bp was fine. She states she will keep virtual appt for tomorrow

## 2019-06-19 ENCOUNTER — Encounter: Payer: Self-pay | Admitting: Nurse Practitioner

## 2019-06-19 ENCOUNTER — Other Ambulatory Visit: Payer: Self-pay

## 2019-06-19 ENCOUNTER — Ambulatory Visit (INDEPENDENT_AMBULATORY_CARE_PROVIDER_SITE_OTHER): Payer: 59 | Admitting: Nurse Practitioner

## 2019-06-19 DIAGNOSIS — I10 Essential (primary) hypertension: Secondary | ICD-10-CM | POA: Diagnosis not present

## 2019-06-19 NOTE — Telephone Encounter (Signed)
FYI- looks like you're seeing her after lunch.

## 2019-06-19 NOTE — Assessment & Plan Note (Signed)
Chronic, ongoing with improved BP over past two days with adjustment in dose times and BP checks.  Continue current medication regimen and adjust as needed.  Recommend checking BP daily and documenting in log for provider, bringing to appointments.  Educated on s/s to present to ER for.  Return in 4 weeks for follow-up.

## 2019-06-19 NOTE — Patient Instructions (Signed)

## 2019-06-19 NOTE — Progress Notes (Signed)
Ht 5' 4.5" (1.638 m)   Wt 156 lb (70.8 kg)   BMI 26.36 kg/m    Subjective:    Patient ID: Ashley [name], female    DOB: [birth year], 68 y.o.   MRN: [id number]  HPI: Ashley RoanDebra M Raymond is a 68 y.o. female  Chief Complaint  Patient presents with  . Hypertension    pt states her BP has been good for the last 2 days    . This visit was completed via telephone due to the restrictions of the COVID-19 pandemic. All issues as above were discussed and addressed but no physical exam was performed. If it was felt that the patient should be evaluated in the office, they were directed there. The patient verbally consented to this visit. Patient was unable to complete an audio/visual visit due to Technical difficulties,Lack of internet. Due to the catastrophic nature of the COVID-19 pandemic, this visit was done through audio contact only. . Location of the patient: home . Location of the provider: work . Those involved with this call:  . Provider: Aura DialsJolene Tildon Silveria, DNP . CMA: Wilhemena DurieBrittany Russell, CMA . Front Desk/Registration: Harriet PhoJoliza Johnson  . Time spent on call: 15 minutes on the phone discussing health concerns. 10 minutes total spent in review of patient's record and preparation of their chart.   HYPERTENSION Did go to ER on 06/17/2019, but did not stay to see provider as her BP improved.  She reports she had been taking her blood pressure prior to her medication and has since changed her Hydralazine to different times and been taking BP after meds, since this time her BP is better over past two days.  States they have been the lowest numbers she has seen in awhile.  She believes some her her elevations have been related to anxiety.  She denies any symptoms such as SOB, CP, palpitations, or headaches.  Her current medications include Losartan, Hydralazine, and Atenolol.   Hypertension status: stable  Satisfied with current treatment? yes Duration of hypertension: chronic BP monitoring frequency:   daily BP range: 138/82 this morning and yesterday 140/84 BP medication side effects:  no Medication compliance: good compliance Previous BP meds: Atenolol, Chlorthalidone, Losartan, Hydralazine Aspirin: no Recurrent headaches: no Visual changes: no Palpitations: no Dyspnea: no Chest pain: no Lower extremity edema: no Dizzy/lightheaded: no  Relevant past medical, surgical, family and social history reviewed and updated as indicated. Interim medical history since our last visit reviewed. Allergies and medications reviewed and updated.  Review of Systems  Constitutional: Negative for activity change, appetite change, diaphoresis, fatigue and fever.  Respiratory: Negative for cough, chest tightness and shortness of breath.   Cardiovascular: Negative for chest pain, palpitations and leg swelling.  Gastrointestinal: Negative for abdominal distention, abdominal pain, constipation, diarrhea, nausea and vomiting.  Neurological: Negative for dizziness, syncope, weakness, light-headedness, numbness and headaches.  Psychiatric/Behavioral: Negative.     Per HPI unless specifically indicated above     Objective:    Ht 5' 4.5" (1.638 m)   Wt 156 lb (70.8 kg)   BMI 26.36 kg/m   Wt Readings from Last 3 Encounters:  06/19/19 156 lb (70.8 kg)  06/17/19 156 lb (70.8 kg)  06/11/19 156 lb (70.8 kg)    Physical Exam   Unable to perform due to telephone visit only  Results for orders placed or performed in visit on 05/26/19  Microscopic Examination   URINE  Result Value Ref Range   WBC, UA >30 (A) 0 - 5 /hpf  RBC None seen 0 - 2 /hpf   Epithelial Cells (non renal) 0-10 0 - 10 /hpf   Bacteria, UA Many (A) None seen/Few  Urine Culture, Reflex   URINE  Result Value Ref Range   Urine Culture, Routine Final report (A)    Organism ID, Bacteria Escherichia coli (A)    Antimicrobial Susceptibility Comment   CBC with Differential/Platelet  Result Value Ref Range   WBC 4.3 3.4 - 10.8  x10E3/uL   RBC 3.42 (L) 3.77 - 5.28 x10E6/uL   Hemoglobin 11.9 11.1 - 15.9 g/dL   Hematocrit 37.7 34.0 - 46.6 %   MCV 110 (H) 79 - 97 fL   MCH 34.8 (H) 26.6 - 33.0 pg   MCHC 31.6 31.5 - 35.7 g/dL   RDW 13.7 11.7 - 15.4 %   Platelets 264 150 - 450 x10E3/uL   Neutrophils 61 Not Estab. %   Lymphs 23 Not Estab. %   Monocytes 11 Not Estab. %   Eos 4 Not Estab. %   Basos 1 Not Estab. %   Neutrophils Absolute 2.7 1.4 - 7.0 x10E3/uL   Lymphocytes Absolute 1.0 0.7 - 3.1 x10E3/uL   Monocytes Absolute 0.5 0.1 - 0.9 x10E3/uL   EOS (ABSOLUTE) 0.2 0.0 - 0.4 x10E3/uL   Basophils Absolute 0.1 0.0 - 0.2 x10E3/uL   Immature Granulocytes 0 Not Estab. %   Immature Grans (Abs) 0.0 0.0 - 0.1 x10E3/uL  Comprehensive metabolic panel  Result Value Ref Range   Glucose 97 65 - 99 mg/dL   BUN 43 (H) 8 - 27 mg/dL   Creatinine, Ser 1.66 (H) 0.57 - 1.00 mg/dL   GFR calc non Af Amer 32 (L) >59 mL/min/1.73   GFR calc Af Amer 37 (L) >59 mL/min/1.73   BUN/Creatinine Ratio 26 12 - 28   Sodium 142 134 - 144 mmol/L   Potassium 5.9 (H) 3.5 - 5.2 mmol/L   Chloride 106 96 - 106 mmol/L   CO2 20 20 - 29 mmol/L   Calcium 10.2 8.7 - 10.3 mg/dL   Total Protein 7.7 6.0 - 8.5 g/dL   Albumin 4.7 3.8 - 4.8 g/dL   Globulin, Total 3.0 1.5 - 4.5 g/dL   Albumin/Globulin Ratio 1.6 1.2 - 2.2   Bilirubin Total 0.2 0.0 - 1.2 mg/dL   Alkaline Phosphatase 75 39 - 117 IU/L   AST 21 0 - 40 IU/L   ALT 20 0 - 32 IU/L  Lipid Panel w/o Chol/HDL Ratio  Result Value Ref Range   Cholesterol, Total 218 (H) 100 - 199 mg/dL   Triglycerides 139 0 - 149 mg/dL   HDL 117 >39 mg/dL   VLDL Cholesterol Cal 28 5 - 40 mg/dL   LDL Calculated 73 0 - 99 mg/dL  Microalbumin, Urine Waived  Result Value Ref Range   Microalb, Ur Waived 80 (H) 0 - 19 mg/L   Creatinine, Urine Waived 50 10 - 300 mg/dL   Microalb/Creat Ratio 30-300 (H) <30 mg/g  TSH  Result Value Ref Range   TSH 2.110 0.450 - 4.500 uIU/mL  UA/M w/rflx Culture, Routine   Specimen:  Urine   URINE  Result Value Ref Range   Specific Gravity, UA 1.020 1.005 - 1.030   pH, UA 5.5 5.0 - 7.5   Color, UA Yellow Yellow   Appearance Ur Cloudy (A) Clear   Leukocytes,UA 3+ (A) Negative   Protein,UA Trace (A) Negative/Trace   Glucose, UA Negative Negative   Ketones, UA Negative Negative  RBC, UA Negative Negative   Bilirubin, UA Negative Negative   Urobilinogen, Ur 0.2 0.2 - 1.0 mg/dL   Nitrite, UA Positive (A) Negative   Microscopic Examination See below:    Urinalysis Reflex Comment   Iron and TIBC  Result Value Ref Range   Total Iron Binding Capacity 281 250 - 450 ug/dL   UIBC 952152 841118 - 324369 ug/dL   Iron 401129 27 - 027139 ug/dL   Iron Saturation 46 15 - 55 %  Ferritin  Result Value Ref Range   Ferritin 254 (H) 15 - 150 ng/mL  B12 and Folate Panel  Result Value Ref Range   Vitamin B-12 >2000 (H) 232 - 1245 pg/mL   Folate 10.4 >3.0 ng/mL      Assessment & Plan:   Problem List Items Addressed This Visit      Cardiovascular and Mediastinum   Benign essential HTN    Chronic, ongoing with improved BP over past two days with adjustment in dose times and BP checks.  Continue current medication regimen and adjust as needed.  Recommend checking BP daily and documenting in log for provider, bringing to appointments.  Educated on s/s to present to ER for.  Return in 4 weeks for follow-up.           I discussed the assessment and treatment plan with the patient. The patient was provided an opportunity to ask questions and all were answered. The patient agreed with the plan and demonstrated an understanding of the instructions.   The patient was advised to call back or seek an in-person evaluation if the symptoms worsen or if the condition fails to improve as anticipated.   I provided 15 minutes of time during this encounter.  Follow up plan: Return in about 4 weeks (around 07/17/2019) for HTN with PCP.

## 2019-09-14 IMAGING — CT CT HEAD W/O CM
3 series · 15 of 45 positions shown, 18 images · non-contrast
Comparison: MRI of the head August 06, 2005

CLINICAL DATA: Nausea, vomiting and diarrhea beginning this
morning, dizziness. History of hypertension, migraine.

EXAM:
CT HEAD WITHOUT CONTRAST
TECHNIQUE: Contiguous axial images were obtained from the base of the skull
through the vertex without intravenous contrast.

[Series 2: head wo · axial · 0.47mm/px · z∈[-126,-11]mm · 9 of 28 slices shown, 12 images]
[im 3/28  brain]
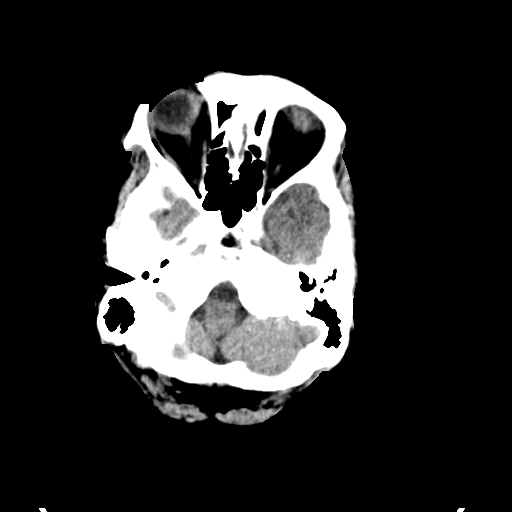
[im 3/28  bone]
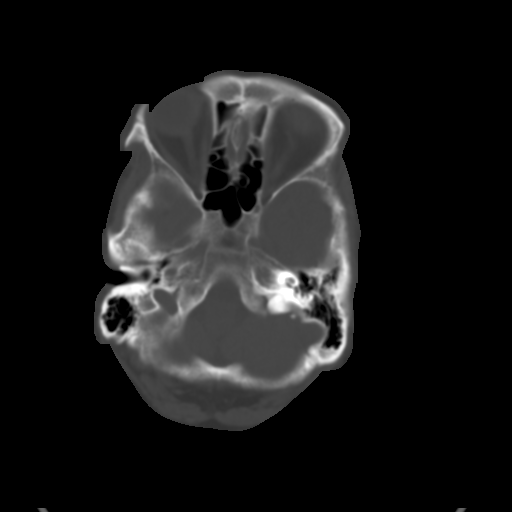
[im 6/28  brain]
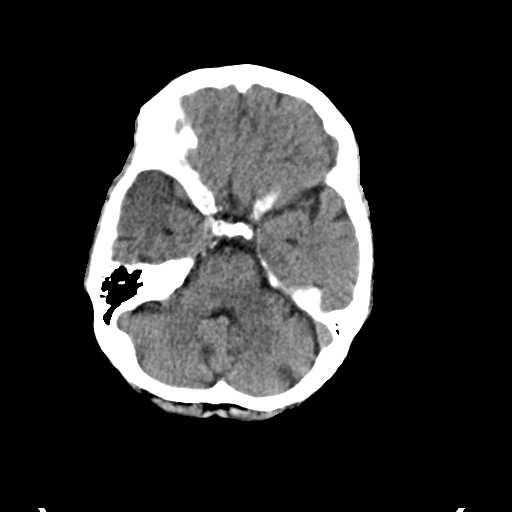
[im 9/28  brain]
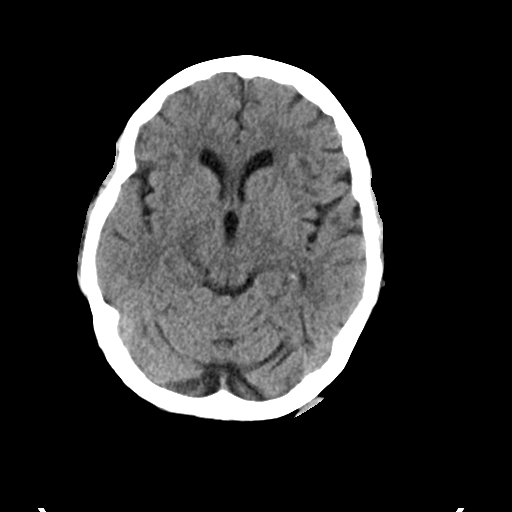
[im 12/28  brain]
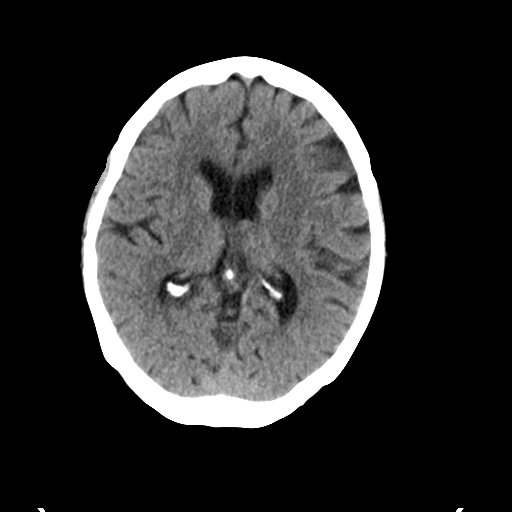
[im 15/28  brain]
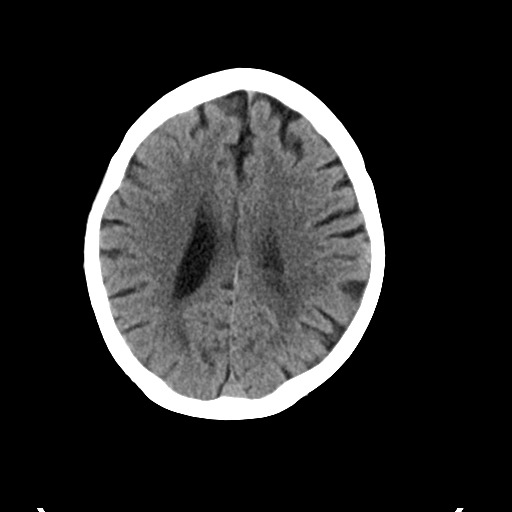
[im 15/28  bone]
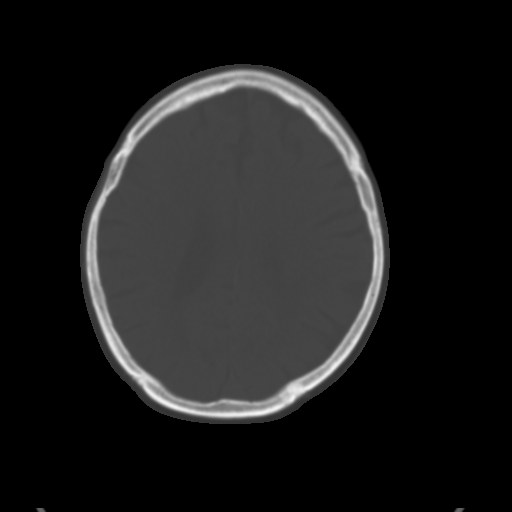
[im 17/28  brain]
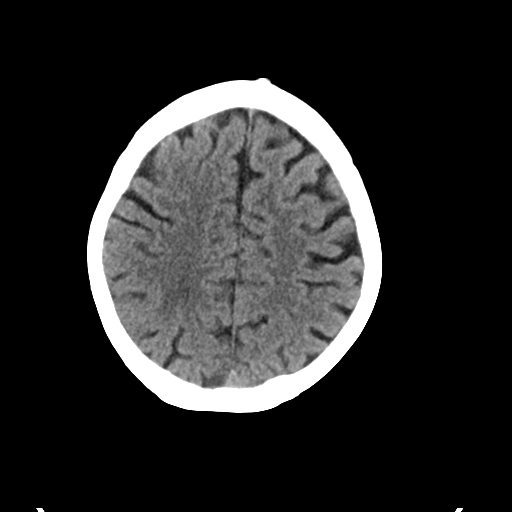
[im 20/28  brain]
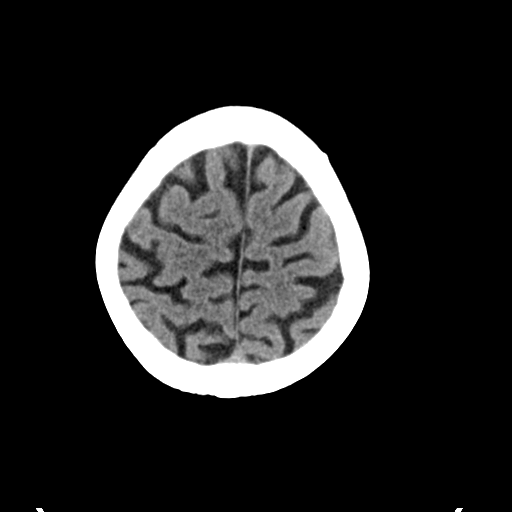
[im 23/28  brain]
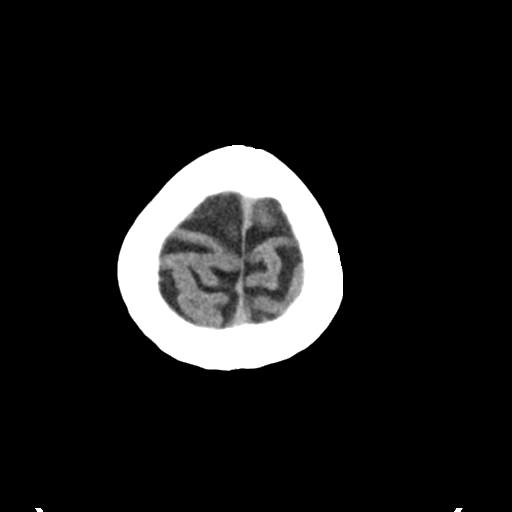
[im 26/28  brain]
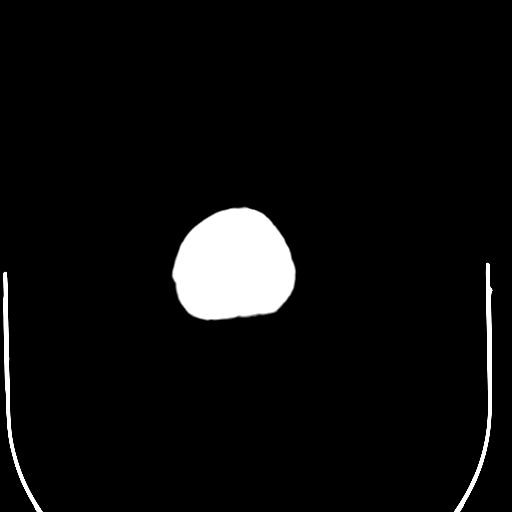
[im 26/28  bone]
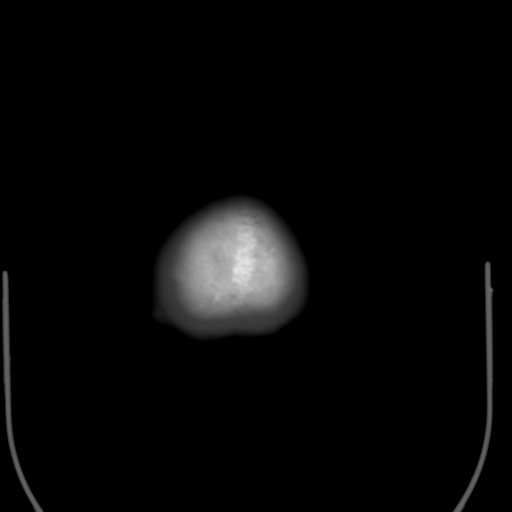

[Series 4: coronal soft tissue · coronal · 0.27mm/px · 3 of 61 slices shown]
[im 21/61  brain]
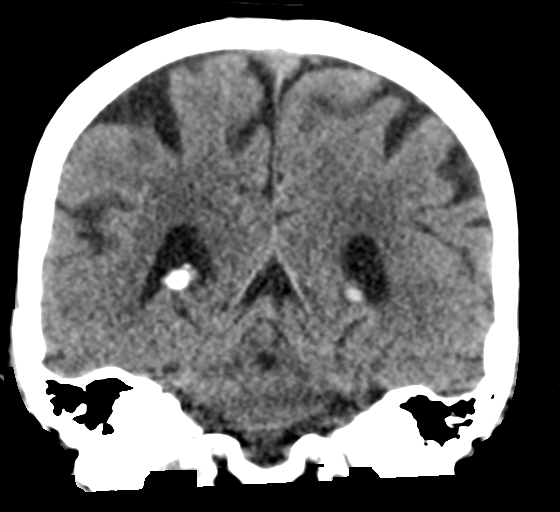
[im 27/61  brain]
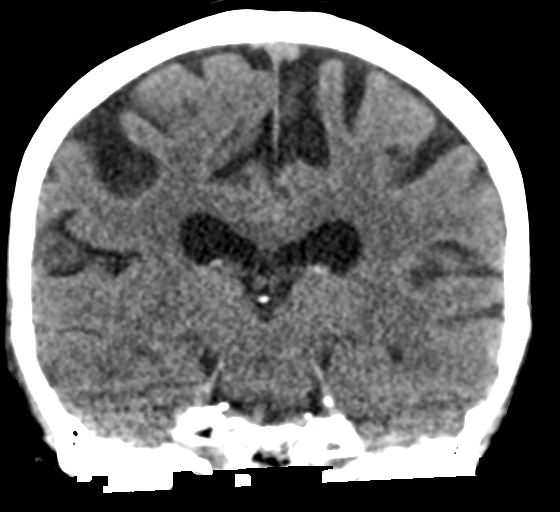
[im 34/61  brain]
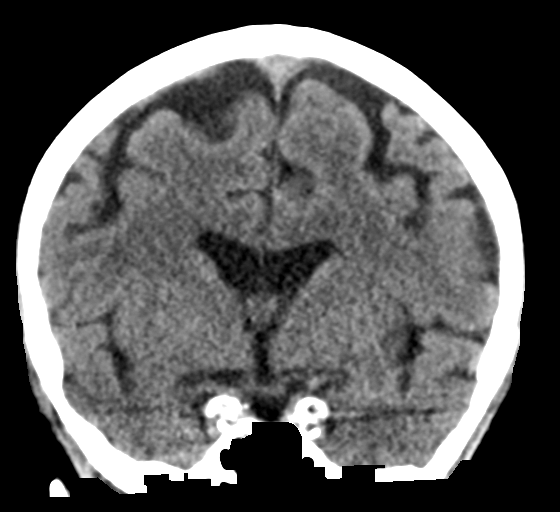

[Series 5: sagittal soft tissue · sagittal · 0.27mm/px · 3 of 50 slices shown]
[im 17/50  brain]
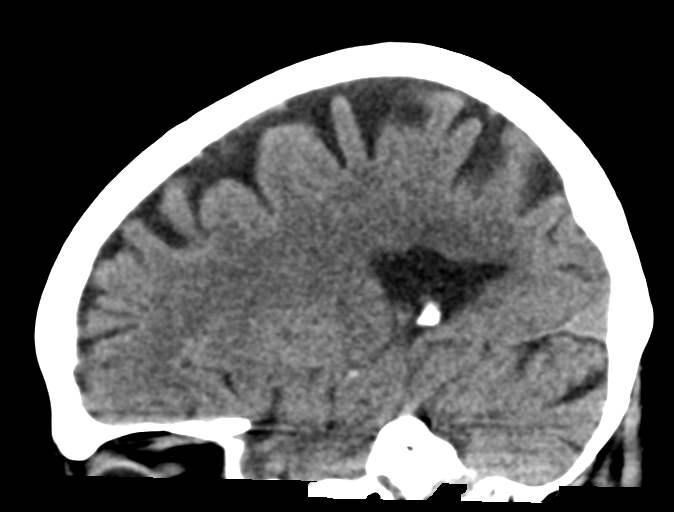
[im 25/50  brain]
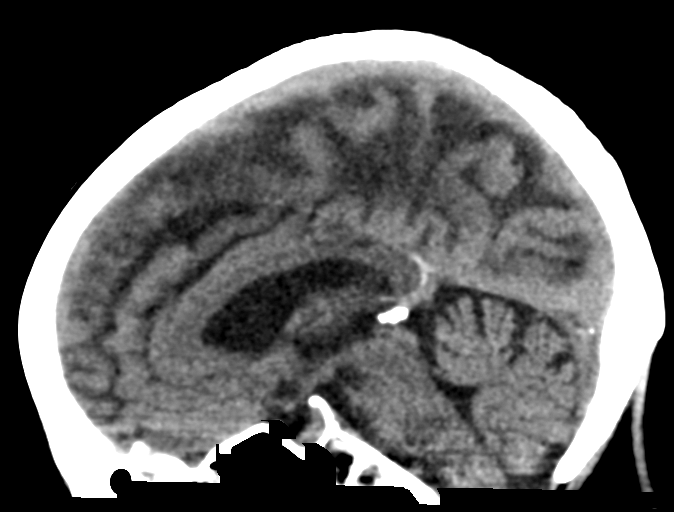
[im 33/50  brain]
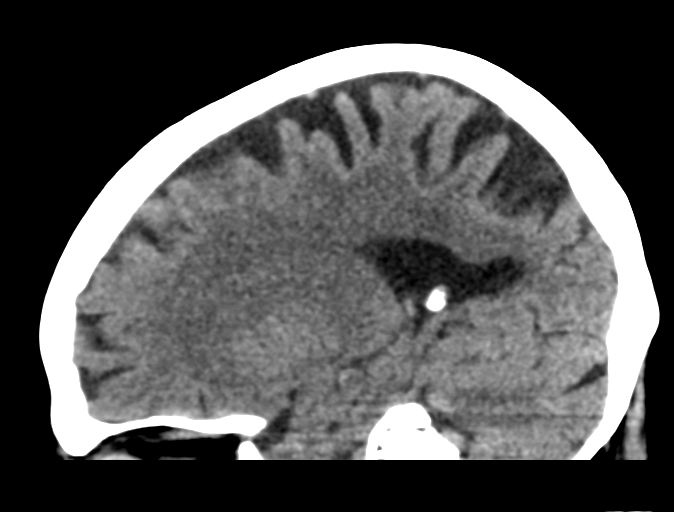

[15 of 45 positions shown; findings below may reference images not displayed]

FINDINGS: BRAIN: No intraparenchymal hemorrhage, mass effect nor midline
shift. Mild parenchymal brain volume loss. No hydrocephalus. Patchy
supratentorial white matter hypodensities within normal range for
patient's age, though non-specific are most compatible with chronic
small vessel ischemic disease. No acute large vascular territory
infarcts. No abnormal extra-axial fluid collections. Basal cisterns
are patent.

VASCULAR: Mild-to-moderate calcific atherosclerosis of the carotid
siphons.

SKULL: No skull fracture. No significant scalp soft tissue swelling.
Multiple scalp calcified masses consistent with trichilemmal cysts.

SINUSES/ORBITS: Trace paranasal sinus mucosal thickening. Mastoid
air cells are well aerated.The included ocular globes and orbital
contents are non-suspicious.

OTHER: None.
IMPRESSION: 1. No acute intracranial process.
2. Mild parenchymal brain volume loss for age.
3. Mild chronic small vessel ischemic changes.

## 2019-09-14 IMAGING — MR MR HEAD W/O CM
10 series · 47 of 48 positions shown · non-contrast
Comparison: 06/08/2018 CT head

CLINICAL DATA: 67 y/o F; Nausea, vomiting and diarrhea beginning
this morning, dizziness. History of hypertension, migraine.

EXAM:
MRI HEAD WITHOUT CONTRAST
TECHNIQUE: Multiplanar, multiecho pulse sequences of the brain and surrounding
structures were obtained without intravenous contrast.

[Series 4: GRE · sagittal · 5.0mm · 0.45mm/px · 3 of 25 slices shown (1 of 2)]
[im 1/25]
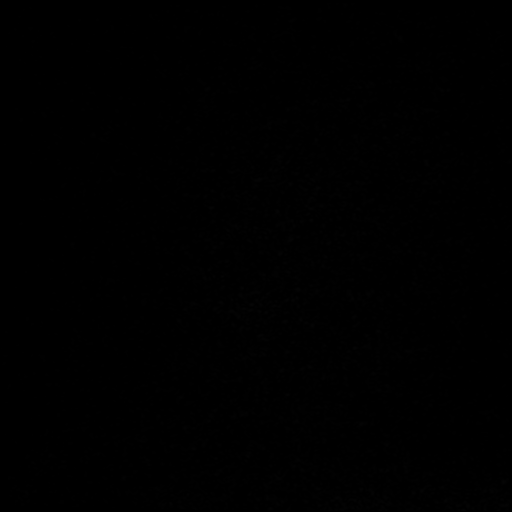
[im 13/25]
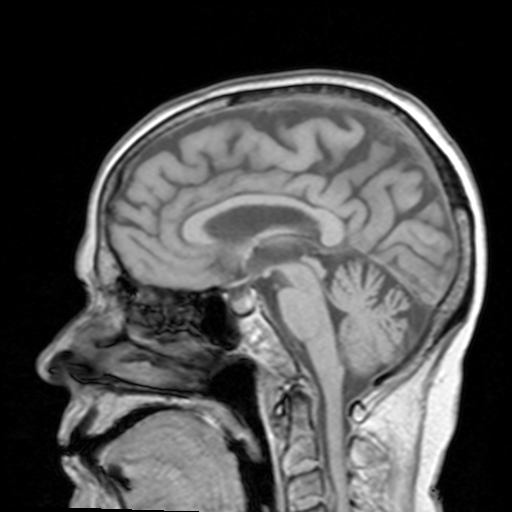
[im 25/25]
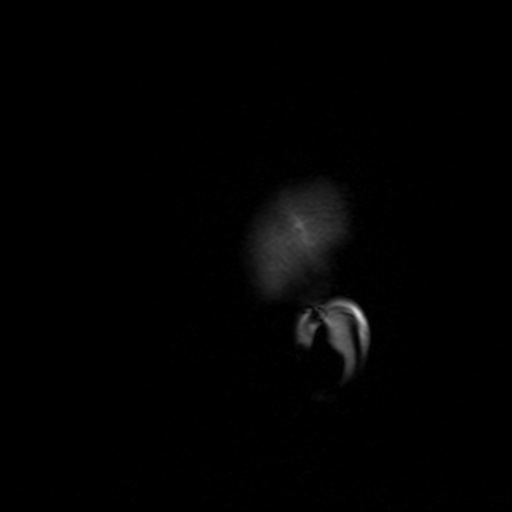

[Series 6: DWI · axial · 3.0mm · 1.80mm/px · z∈[-70,+92]mm · 7 of 54 slices shown (1 of 4)]
[im 1/54]
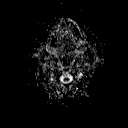
[im 9/54]
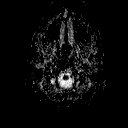
[im 18/54]
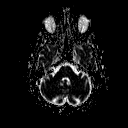
[im 27/54]
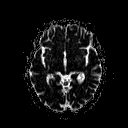
[im 36/54]
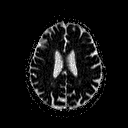
[im 45/54]
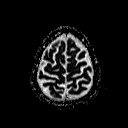
[im 54/54]
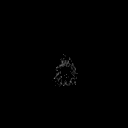

[Series 8: DWI · coronal · 3.0mm · 1.80mm/px · 6 of 49 slices shown (2 of 4)]
[im 1/49]
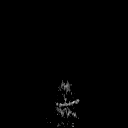
[im 10/49]
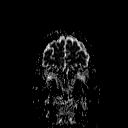
[im 20/49]
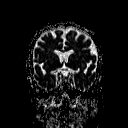
[im 29/49]
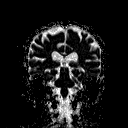
[im 39/49]
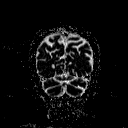
[im 49/49]
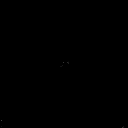

[Series 9: T2 · axial · 5.0mm · 0.45mm/px · z∈[-62,+94]mm · 3 of 25 slices shown (1 of 3)]
[im 1/25]
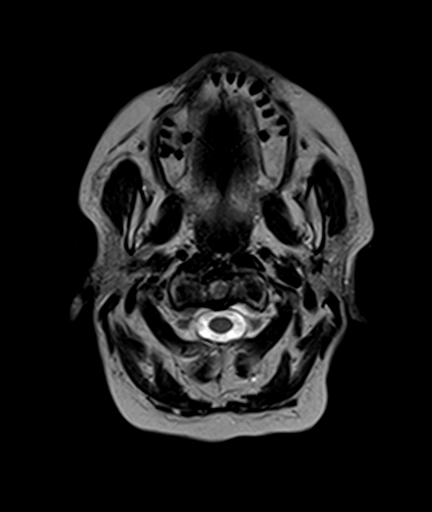
[im 13/25]
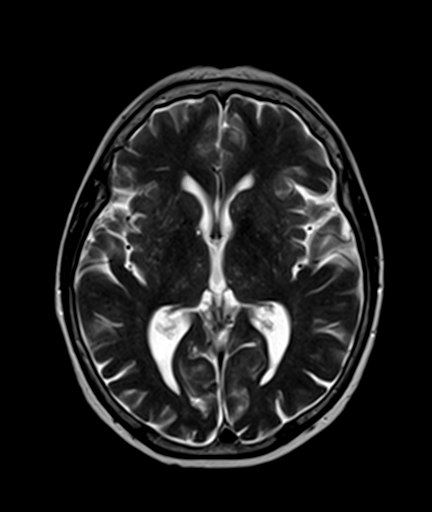
[im 25/25]
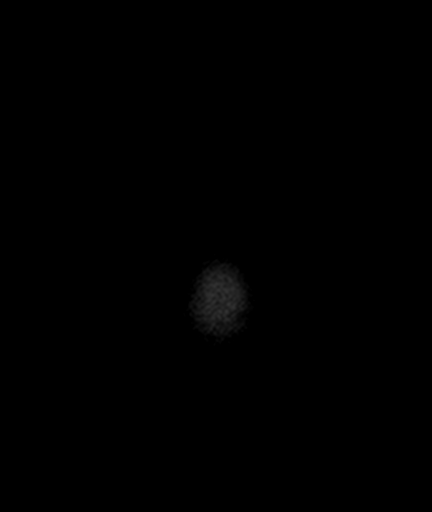

[Series 10: FLAIR · axial · 3.0mm · 0.45mm/px · z∈[-62,+94]mm · 7 of 53 slices shown]
[im 1/53]
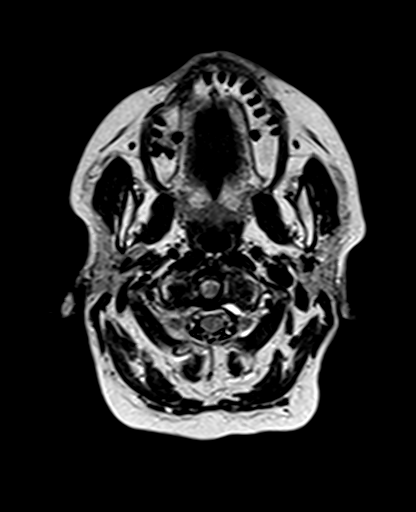
[im 9/53]
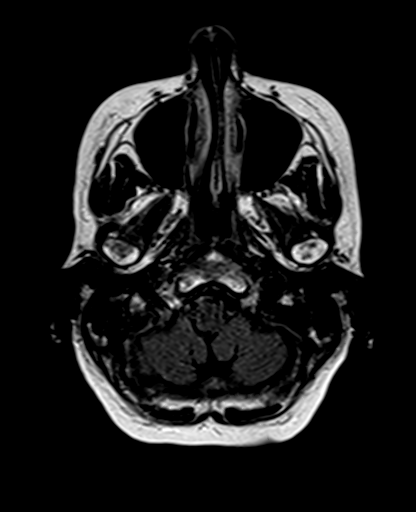
[im 18/53]
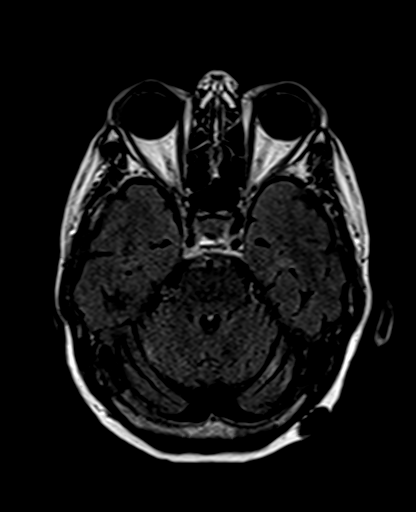
[im 27/53]
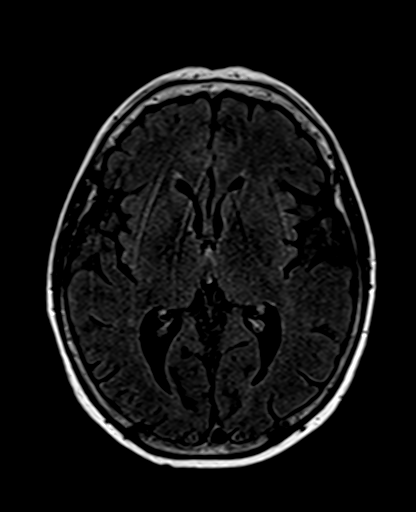
[im 35/53]
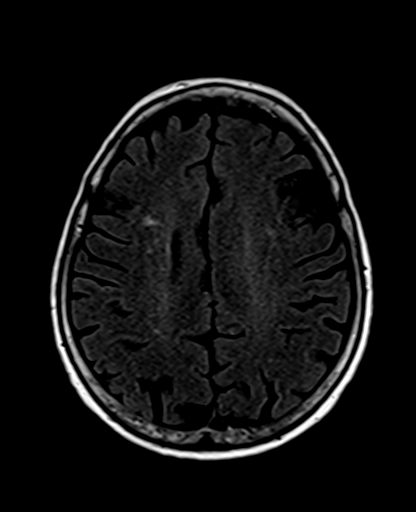
[im 44/53]
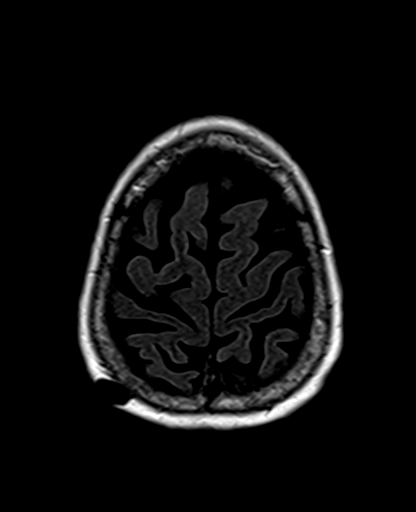
[im 53/53]
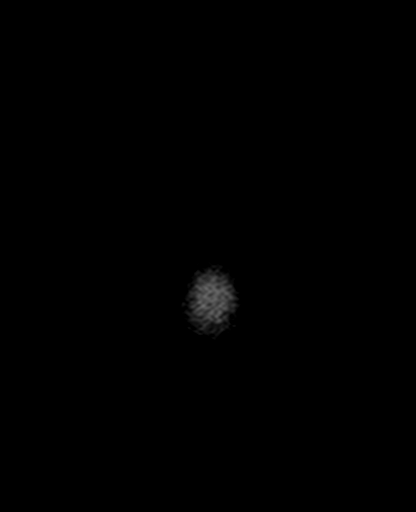

[Series 11: T2 · axial · 5.0mm · 1.20mm/px · z∈[-62,+94]mm · 3 of 25 slices shown (2 of 3)]
[im 1/25]
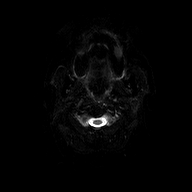
[im 13/25]
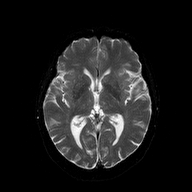
[im 25/25]
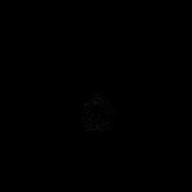

[Series 12: GRE · axial · 5.0mm · 0.45mm/px · z∈[-62,+16]mm · 2 of 25 slices shown (2 of 2)]
[im 1/25]
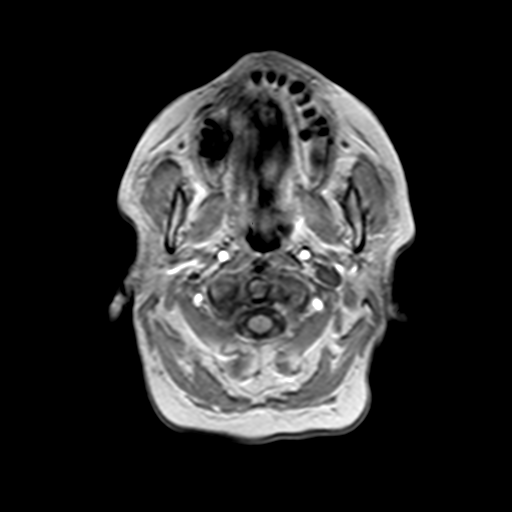
[im 13/25]
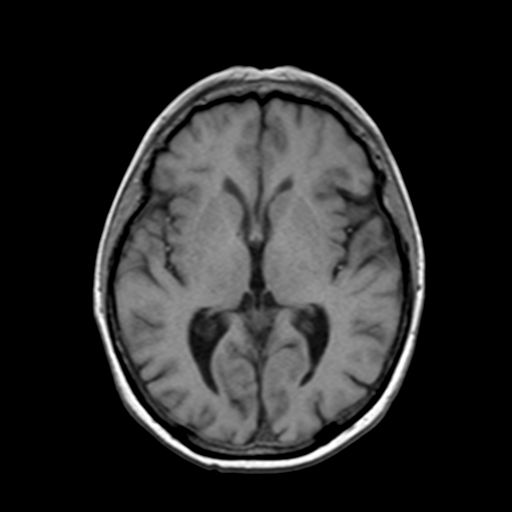

[Series 13: T2 · coronal · 5.0mm · 0.43mm/px · 3 of 26 slices shown (3 of 3)]
[im 1/26]
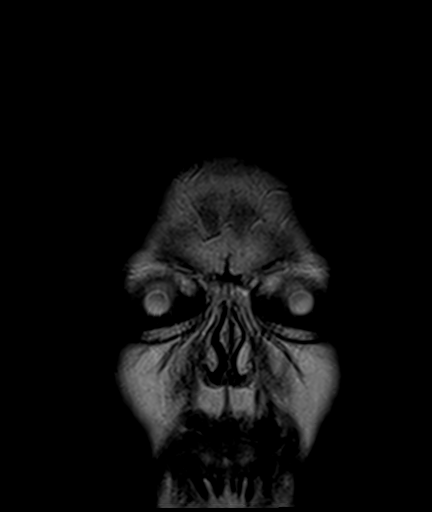
[im 13/26]
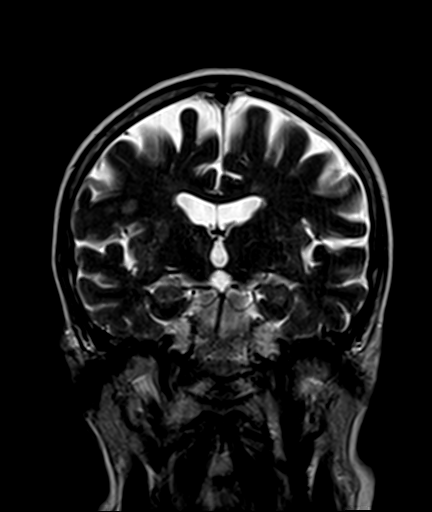
[im 26/26]
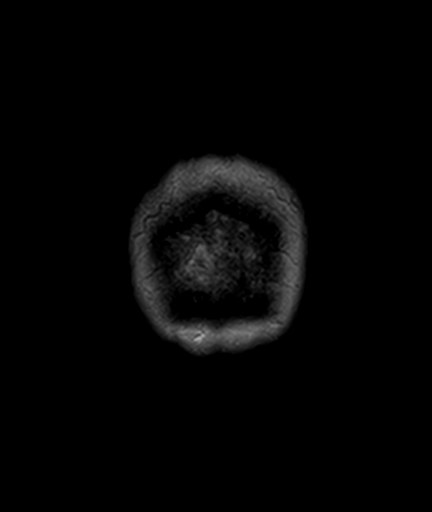

[Series 100: DWI · axial · 3.0mm · 1.80mm/px · z∈[-70,+92]mm · 7 of 54 slices shown (3 of 4)]
[im 1/54]
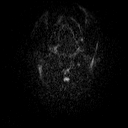
[im 9/54]
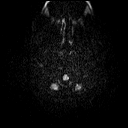
[im 18/54]
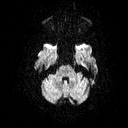
[im 27/54]
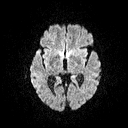
[im 36/54]
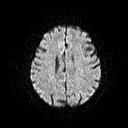
[im 45/54]
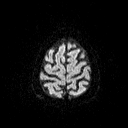
[im 54/54]
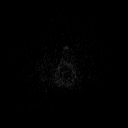

[Series 101: DWI · coronal · 3.0mm · 1.80mm/px · 6 of 46 slices shown (4 of 4)]
[im 1/46]
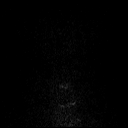
[im 10/46]
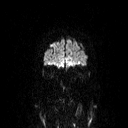
[im 19/46]
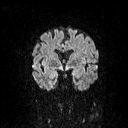
[im 28/46]
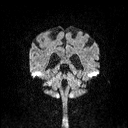
[im 37/46]
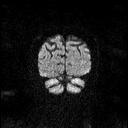
[im 46/46]
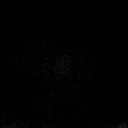

[47 of 48 positions shown; findings below may reference images not displayed]

FINDINGS: Brain: No acute infarction, hemorrhage, hydrocephalus, extra-axial
collection or mass lesion. Several nonspecific foci of T2 FLAIR
hyperintense signal abnormality in subcortical and periventricular
white matter are compatible with mild chronic microvascular ischemic
changes for age. Mild diffuse brain parenchymal volume loss.

Vascular: Normal flow voids.

Skull and upper cervical spine: Normal marrow signal.

Sinuses/Orbits: Negative.

Other: Several dermal nodules with T1 hyperintense and T2
hypointense signal within the scalp. The largest is in the left
occipital region measuring 18 mm. Findings likely represent
proteinaceous dermal appendage cysts.
IMPRESSION: 1. No acute intracranial abnormality identified.
2. Mild chronic microvascular ischemic changes and volume loss of
the brain.

By: Alexandro Rachel M.D.

## 2019-10-02 ENCOUNTER — Inpatient Hospital Stay
Admission: EM | Admit: 2019-10-02 | Discharge: 2019-10-04 | DRG: 683 | Disposition: A | Discharge status: Home / Self Care | Payer: 59 | Attending: Internal Medicine | Admitting: Internal Medicine

## 2019-10-02 ENCOUNTER — Inpatient Hospital Stay: Payer: 59

## 2019-10-02 ENCOUNTER — Other Ambulatory Visit: Payer: Self-pay

## 2019-10-02 ENCOUNTER — Encounter: Payer: Self-pay | Admitting: Emergency Medicine

## 2019-10-02 ENCOUNTER — Emergency Department: Payer: 59

## 2019-10-02 DIAGNOSIS — Z96651 Presence of right artificial knee joint: Secondary | ICD-10-CM | POA: Diagnosis present

## 2019-10-02 DIAGNOSIS — F909 Attention-deficit hyperactivity disorder, unspecified type: Secondary | ICD-10-CM | POA: Diagnosis present

## 2019-10-02 DIAGNOSIS — E872 Acidosis, unspecified: Secondary | ICD-10-CM

## 2019-10-02 DIAGNOSIS — G43909 Migraine, unspecified, not intractable, without status migrainosus: Secondary | ICD-10-CM | POA: Diagnosis present

## 2019-10-02 DIAGNOSIS — Z87891 Personal history of nicotine dependence: Secondary | ICD-10-CM

## 2019-10-02 DIAGNOSIS — I119 Hypertensive heart disease without heart failure: Secondary | ICD-10-CM | POA: Diagnosis present

## 2019-10-02 DIAGNOSIS — J449 Chronic obstructive pulmonary disease, unspecified: Secondary | ICD-10-CM | POA: Diagnosis present

## 2019-10-02 DIAGNOSIS — E871 Hypo-osmolality and hyponatremia: Secondary | ICD-10-CM | POA: Diagnosis present

## 2019-10-02 DIAGNOSIS — E861 Hypovolemia: Secondary | ICD-10-CM | POA: Diagnosis present

## 2019-10-02 DIAGNOSIS — Z23 Encounter for immunization: Secondary | ICD-10-CM | POA: Diagnosis not present

## 2019-10-02 DIAGNOSIS — D539 Nutritional anemia, unspecified: Secondary | ICD-10-CM | POA: Diagnosis present

## 2019-10-02 DIAGNOSIS — Z888 Allergy status to other drugs, medicaments and biological substances status: Secondary | ICD-10-CM | POA: Diagnosis not present

## 2019-10-02 DIAGNOSIS — E538 Deficiency of other specified B group vitamins: Secondary | ICD-10-CM | POA: Diagnosis present

## 2019-10-02 DIAGNOSIS — K219 Gastro-esophageal reflux disease without esophagitis: Secondary | ICD-10-CM | POA: Diagnosis present

## 2019-10-02 DIAGNOSIS — Z8249 Family history of ischemic heart disease and other diseases of the circulatory system: Secondary | ICD-10-CM | POA: Diagnosis not present

## 2019-10-02 DIAGNOSIS — E875 Hyperkalemia: Secondary | ICD-10-CM | POA: Diagnosis present

## 2019-10-02 DIAGNOSIS — I517 Cardiomegaly: Secondary | ICD-10-CM

## 2019-10-02 DIAGNOSIS — N179 Acute kidney failure, unspecified: Principal | ICD-10-CM

## 2019-10-02 DIAGNOSIS — R112 Nausea with vomiting, unspecified: Secondary | ICD-10-CM

## 2019-10-02 DIAGNOSIS — R079 Chest pain, unspecified: Secondary | ICD-10-CM

## 2019-10-02 DIAGNOSIS — K2270 Barrett's esophagus without dysplasia: Secondary | ICD-10-CM | POA: Diagnosis present

## 2019-10-02 DIAGNOSIS — Z20828 Contact with and (suspected) exposure to other viral communicable diseases: Secondary | ICD-10-CM | POA: Diagnosis present

## 2019-10-02 LAB — CBC WITH DIFFERENTIAL/PLATELET
Abs Immature Granulocytes: 0.03 10*3/uL (ref 0.00–0.07)
Basophils Absolute: 0 10*3/uL (ref 0.0–0.1)
Basophils Relative: 0 %
Eosinophils Absolute: 0 10*3/uL (ref 0.0–0.5)
Eosinophils Relative: 0 %
HCT: 31.5 % — ABNORMAL LOW (ref 36.0–46.0)
Hemoglobin: 11.1 g/dL — ABNORMAL LOW (ref 12.0–15.0)
Immature Granulocytes: 0 %
Lymphocytes Relative: 6 %
Lymphs Abs: 0.6 10*3/uL — ABNORMAL LOW (ref 0.7–4.0)
MCH: 35.8 pg — ABNORMAL HIGH (ref 26.0–34.0)
MCHC: 35.2 g/dL (ref 30.0–36.0)
MCV: 101.6 fL — ABNORMAL HIGH (ref 80.0–100.0)
Monocytes Absolute: 0.5 10*3/uL (ref 0.1–1.0)
Monocytes Relative: 5 %
Neutro Abs: 8.6 10*3/uL — ABNORMAL HIGH (ref 1.7–7.7)
Neutrophils Relative %: 89 %
Platelets: 249 10*3/uL (ref 150–400)
RBC: 3.1 MIL/uL — ABNORMAL LOW (ref 3.87–5.11)
RDW: 13.7 % (ref 11.5–15.5)
WBC: 9.6 10*3/uL (ref 4.0–10.5)
nRBC: 0 % (ref 0.0–0.2)

## 2019-10-02 LAB — URINALYSIS, COMPLETE (UACMP) WITH MICROSCOPIC
Bilirubin Urine: NEGATIVE
Glucose, UA: NEGATIVE mg/dL
Hgb urine dipstick: NEGATIVE
Ketones, ur: NEGATIVE mg/dL
Nitrite: NEGATIVE
Protein, ur: NEGATIVE mg/dL
Specific Gravity, Urine: 1.009 (ref 1.005–1.030)
pH: 5 (ref 5.0–8.0)

## 2019-10-02 LAB — HIV ANTIBODY (ROUTINE TESTING W REFLEX): HIV Screen 4th Generation wRfx: NONREACTIVE

## 2019-10-02 LAB — COMPREHENSIVE METABOLIC PANEL
ALT: 22 U/L (ref 0–44)
AST: 31 U/L (ref 15–41)
Albumin: 3.8 g/dL (ref 3.5–5.0)
Alkaline Phosphatase: 77 U/L (ref 38–126)
Anion gap: 17 — ABNORMAL HIGH (ref 5–15)
BUN: 63 mg/dL — ABNORMAL HIGH (ref 8–23)
CO2: 13 mmol/L — ABNORMAL LOW (ref 22–32)
Calcium: 8.5 mg/dL — ABNORMAL LOW (ref 8.9–10.3)
Chloride: 96 mmol/L — ABNORMAL LOW (ref 98–111)
Creatinine, Ser: 3.13 mg/dL — ABNORMAL HIGH (ref 0.44–1.00)
GFR calc Af Amer: 17 mL/min — ABNORMAL LOW (ref >60)
GFR calc non Af Amer: 15 mL/min — ABNORMAL LOW (ref >60)
Glucose, Bld: 69 mg/dL — ABNORMAL LOW (ref 70–99)
Potassium: 7 mmol/L (ref 3.5–5.1)
Sodium: 126 mmol/L — ABNORMAL LOW (ref 135–145)
Total Bilirubin: 0.6 mg/dL (ref 0.3–1.2)
Total Protein: 7.7 g/dL (ref 6.5–8.1)

## 2019-10-02 LAB — BASIC METABOLIC PANEL
Anion gap: 18 — ABNORMAL HIGH (ref 5–15)
BUN: 58 mg/dL — ABNORMAL HIGH (ref 8–23)
CO2: 14 mmol/L — ABNORMAL LOW (ref 22–32)
Calcium: 8.5 mg/dL — ABNORMAL LOW (ref 8.9–10.3)
Chloride: 97 mmol/L — ABNORMAL LOW (ref 98–111)
Creatinine, Ser: 2.29 mg/dL — ABNORMAL HIGH (ref 0.44–1.00)
GFR calc Af Amer: 25 mL/min — ABNORMAL LOW (ref >60)
GFR calc non Af Amer: 22 mL/min — ABNORMAL LOW (ref >60)
Glucose, Bld: 77 mg/dL (ref 70–99)
Potassium: 4.7 mmol/L (ref 3.5–5.1)
Sodium: 129 mmol/L — ABNORMAL LOW (ref 135–145)

## 2019-10-02 LAB — TROPONIN I (HIGH SENSITIVITY)
Troponin I (High Sensitivity): 9 ng/L (ref <18)
Troponin I (High Sensitivity): 9 ng/L (ref <18)

## 2019-10-02 LAB — GLUCOSE, CAPILLARY: Glucose-Capillary: 142 mg/dL — ABNORMAL HIGH (ref 70–99)

## 2019-10-02 LAB — POC SARS CORONAVIRUS 2 AG: SARS Coronavirus 2 Ag: NEGATIVE

## 2019-10-02 MED ORDER — ONDANSETRON HCL 4 MG PO TABS: 4.0000 mg | ORAL_TABLET | Freq: Four times a day (QID) | ORAL | Status: DC | PRN

## 2019-10-02 MED ORDER — CALCIUM GLUCONATE 10 % IV SOLN
1.0000 g | Freq: Once | INTRAVENOUS | Status: AC
  Administered 2019-10-02: 1 g via INTRAVENOUS
  Filled 2019-10-02: qty 10

## 2019-10-02 MED ORDER — ONDANSETRON HCL 4 MG/2ML IJ SOLN
4.0000 mg | Freq: Four times a day (QID) | INTRAMUSCULAR | Status: DC | PRN
  Administered 2019-10-02: 4 mg via INTRAVENOUS
  Filled 2019-10-02: qty 2

## 2019-10-02 MED ORDER — SODIUM BICARBONATE 8.4 % IV SOLN
50.0000 meq | Freq: Once | INTRAVENOUS | Status: AC
  Administered 2019-10-02: 50 meq via INTRAVENOUS
  Filled 2019-10-02: qty 50

## 2019-10-02 MED ORDER — PROCHLORPERAZINE EDISYLATE 10 MG/2ML IJ SOLN
10.0000 mg | Freq: Once | INTRAMUSCULAR | Status: AC
  Administered 2019-10-03: 10 mg via INTRAVENOUS
  Filled 2019-10-02 (×2): qty 2

## 2019-10-02 MED ORDER — SODIUM CHLORIDE 0.9 % IV SOLN
INTRAVENOUS | Status: DC
  Administered 2019-10-02 – 2019-10-03 (×3): via INTRAVENOUS

## 2019-10-02 MED ORDER — SENNOSIDES-DOCUSATE SODIUM 8.6-50 MG PO TABS: 1.0000 | ORAL_TABLET | Freq: Every evening | ORAL | Status: DC | PRN

## 2019-10-02 MED ORDER — ALBUTEROL SULFATE (2.5 MG/3ML) 0.083% IN NEBU
2.5000 mg | INHALATION_SOLUTION | Freq: Once | RESPIRATORY_TRACT | Status: AC
  Administered 2019-10-02: 2.5 mg via RESPIRATORY_TRACT
  Filled 2019-10-02: qty 3

## 2019-10-02 MED ORDER — PANTOPRAZOLE SODIUM 40 MG PO TBEC
40.0000 mg | DELAYED_RELEASE_TABLET | Freq: Every day | ORAL | Status: DC
  Administered 2019-10-03 – 2019-10-04 (×2): 40 mg via ORAL
  Filled 2019-10-02 (×2): qty 1

## 2019-10-02 MED ORDER — ATENOLOL 100 MG PO TABS
100.0000 mg | ORAL_TABLET | Freq: Two times a day (BID) | ORAL | Status: DC
  Administered 2019-10-02 – 2019-10-04 (×4): 100 mg via ORAL
  Filled 2019-10-02 (×6): qty 1

## 2019-10-02 MED ORDER — GUAIFENESIN-DM 100-10 MG/5ML PO SYRP
5.0000 mL | ORAL_SOLUTION | ORAL | Status: DC | PRN
  Administered 2019-10-02 – 2019-10-03 (×2): 5 mL via ORAL
  Filled 2019-10-02 (×2): qty 5

## 2019-10-02 MED ORDER — HEPARIN SODIUM (PORCINE) 5000 UNIT/ML IJ SOLN
5000.0000 [IU] | Freq: Three times a day (TID) | INTRAMUSCULAR | Status: DC
  Administered 2019-10-02 – 2019-10-04 (×6): 5000 [IU] via SUBCUTANEOUS
  Filled 2019-10-02 (×6): qty 1

## 2019-10-02 MED ORDER — ACETAMINOPHEN 650 MG RE SUPP: 650.0000 mg | Freq: Four times a day (QID) | RECTAL | Status: DC | PRN

## 2019-10-02 MED ORDER — SODIUM ZIRCONIUM CYCLOSILICATE 10 G PO PACK
10.0000 g | PACK | Freq: Once | ORAL | Status: AC
  Administered 2019-10-02: 10 g via ORAL
  Filled 2019-10-02: qty 1

## 2019-10-02 MED ORDER — SODIUM CHLORIDE 0.9 % IV BOLUS
500.0000 mL | Freq: Once | INTRAVENOUS | Status: AC
  Administered 2019-10-02: 500 mL via INTRAVENOUS

## 2019-10-02 MED ORDER — DIAZEPAM 2 MG PO TABS
2.0000 mg | ORAL_TABLET | Freq: Four times a day (QID) | ORAL | Status: DC | PRN
  Administered 2019-10-02 – 2019-10-03 (×4): 2 mg via ORAL
  Filled 2019-10-02 (×4): qty 1

## 2019-10-02 MED ORDER — VITAMIN B-12 1000 MCG PO TABS
3000.0000 ug | ORAL_TABLET | Freq: Every day | ORAL | Status: DC
  Administered 2019-10-03 – 2019-10-04 (×2): 3000 ug via ORAL
  Filled 2019-10-02 (×2): qty 3

## 2019-10-02 MED ORDER — DEXTROSE 50 % IV SOLN
INTRAVENOUS | Status: AC
  Administered 2019-10-02: 15:00:00 50 mL via INTRAVENOUS
  Filled 2019-10-02: qty 50

## 2019-10-02 MED ORDER — ACETAMINOPHEN 325 MG PO TABS: 650.0000 mg | ORAL_TABLET | Freq: Four times a day (QID) | ORAL | Status: DC | PRN

## 2019-10-02 MED ORDER — INSULIN ASPART 100 UNIT/ML ~~LOC~~ SOLN
5.0000 [IU] | Freq: Once | SUBCUTANEOUS | Status: AC
  Administered 2019-10-02: 5 [IU] via INTRAVENOUS
  Filled 2019-10-02: qty 1

## 2019-10-02 MED ORDER — MECLIZINE HCL 25 MG PO TABS
25.0000 mg | ORAL_TABLET | Freq: Once | ORAL | Status: AC
  Administered 2019-10-02: 19:00:00 25 mg via ORAL
  Filled 2019-10-02: qty 1

## 2019-10-02 MED ORDER — PNEUMOCOCCAL VAC POLYVALENT 25 MCG/0.5ML IJ INJ
0.5000 mL | INJECTION | INTRAMUSCULAR | Status: AC
  Administered 2019-10-04: 0.5 mL via INTRAMUSCULAR
  Filled 2019-10-02: qty 0.5

## 2019-10-02 MED ORDER — HYDRALAZINE HCL 50 MG PO TABS
100.0000 mg | ORAL_TABLET | Freq: Three times a day (TID) | ORAL | Status: DC
  Administered 2019-10-02 – 2019-10-04 (×5): 100 mg via ORAL
  Filled 2019-10-02 (×5): qty 2

## 2019-10-02 MED ORDER — DEXTROSE 50 % IV SOLN
1.0000 | Freq: Once | INTRAVENOUS | Status: AC
  Administered 2019-10-02: 15:00:00 50 mL via INTRAVENOUS

## 2019-10-02 MED ORDER — SODIUM CHLORIDE 0.9 % IV SOLN: Freq: Once | INTRAVENOUS | Status: DC

## 2019-10-02 NOTE — ED Provider Notes (Signed)
.  Critical Care Performed by: Merlyn Lot, MD Authorized by: Merlyn Lot, MD   Critical care provider statement:    Critical care time (minutes):  30   Critical care was necessary to treat or prevent imminent or life-threatening deterioration of the following conditions:  Renal failure   Critical care was time spent personally by me on the following activities:  Discussions with consultants, evaluation of patient's response to treatment, examination of patient, ordering and performing treatments and interventions, ordering and review of laboratory studies, ordering and review of radiographic studies, pulse oximetry, re-evaluation of patient's condition, obtaining history from patient or surrogate and review of old charts   Patient does have evidence of acute renal failure with hyperkalemia and EKG changes.  Patient was temporized with IV calcium, insulin, D50, albuterol, sodium bicarb as well as Lokelma.  I discussed the case in consultation with Dr. Holley Raring of nephrology who is aware and will consult on the patient.  Initial plan will be to repeat BMP in 2 hours to evaluate need and timing for urgent dialysis.  Patient on cardiac monitor otherwise appears clinically stable.  Uncertain inciting factor.  May be simply dehydration we will continue IV fluids.   Merlyn Lot, MD 10/02/19 1540

## 2019-10-02 NOTE — H&P (Signed)
History and Physical    Ashley [name] NLZ:[id number] DOB: [birth year] DOA: 10/02/2019  PCP: Valerie Roys, DO Patient coming from: Home  Chief Complaint: Nausea vomiting  HPI: Ashley [name] is a 68 y.o. female with medical history significant of essential hypertension, GERD came to the hospital with complaints of nausea and vomiting.  Patient states for the past 2 days she has felt uneasy but this morning she had lots of nausea and vomiting unable to tolerate any p.o.  She also reports of some nonspecific nonproductive cough.  No other complaints. In the ER patient was noted to be hyperkalemic, metabolic acidosis, hyponatremia and acute kidney injury.  EKG showed T wave changes therefore she was given calcium carbonate, sodium bicarb, insulin, Lokelma and albuterol.  Nephrology was consulted.  Medical team was requested to admit the patient.   Review of Systems: As per HPI otherwise 10 point review of systems negative.  Review of Systems Otherwise negative except as per HPI, including: General: Denies fever, chills, night sweats or unintended weight loss. Resp: Denies cough, wheezing, shortness of breath. Cardiac: Denies chest pain, palpitations, orthopnea, paroxysmal nocturnal dyspnea. GI: Denies  diarrhea or constipation GU: Denies dysuria, frequency, hesitancy or incontinence MS: Denies muscle aches, joint pain or swelling Neuro: Denies headache, neurologic deficits (focal weakness, numbness, tingling), abnormal gait Psych: Denies anxiety, depression, SI/HI/AVH Skin: Denies new rashes or lesions ID: Denies sick contacts, exotic exposures, travel  Past Medical History:  Diagnosis Date  . ADHD (attention deficit hyperactivity disorder) 05/02/2015  . Anemia   . Anxiety   . Arthritis   . Attention deficit disorder   . Autoimmune liver disease   . Barrett's esophagus   . COPD (chronic obstructive pulmonary disease) ([place])   . Depression   . DEPRESSION, HX OF 04/21/2008   Qualifier: Diagnosis of  By: Nelson-Smith CMA (AAMA), Dottie    . Gastritis   . GERD (gastroesophageal reflux disease)   . Hypertension   . Insomnia   . Internal hemorrhoids   . Liver failure ([place])   . Migraine   . Ovarian failure   . Peptic ulcer disease   . PEPTIC ULCER DISEASE, HX OF 04/21/2008   Qualifier: Diagnosis of  By: Nelson-Smith CMA (AAMA), Dottie    . Small bowel obstruction ([place])   . SMALL BOWEL OBSTRUCTION, HX OF 04/21/2008   Qualifier: Diagnosis of  By: Nelson-Smith CMA (AAMA), Dottie    . Status post right knee replacement 05/14/2018    Past Surgical History:  Procedure Laterality Date  . ABDOMINAL HYSTERECTOMY    . DILATION AND CURETTAGE OF UTERUS    . LIVER BIOPSY    . OOPHORECTOMY    . TOTAL KNEE ARTHROPLASTY Right 05/14/2018   Procedure: TOTAL KNEE ARTHROPLASTY;  Surgeon: Lovell Sheehan, MD;  Location: [facility] ORS;  Service: Orthopedics;  Laterality: Right;    SOCIAL HISTORY:  reports that she quit smoking about 30 years ago. She has never used smokeless tobacco. She reports current alcohol use of about 2.0 standard drinks of alcohol per week. She reports that she does not use drugs.  Allergies  Allergen Reactions  . Topiramate Other (See Comments)    "shut my body down." "shut my body down."  . Amlodipine Swelling  . Tylenol [Acetaminophen] Other (See Comments)    Elevated liver enzymes     FAMILY HISTORY: Family History  Problem Relation Age of Onset  . Heart failure Father   . Atrial fibrillation Mother   . Cerebral  palsy Brother   . Heart attack Maternal Grandfather   . CAD Paternal Grandmother   . Colon cancer Neg Hx      Prior to Admission medications   Medication Sig Start Date End Date Taking? Authorizing Provider  atenolol (TENORMIN) 100 MG tablet Take 1 tablet (100 mg total) by mouth 2 (two) times daily. 05/26/19  Yes Johnson, Megan P, DO  Cholecalciferol (VITAMIN D3) 5000 units CAPS Take 1 capsule by mouth daily.   Yes [provider]  Cyanocobalamin (VITAMIN B 12 PO) Take 3,000 mcg by mouth daily.    Yes [provider]  hydrALAZINE (APRESOLINE) 100 MG tablet Take 1 tablet (100 mg total) by mouth 3 (three) times daily. 05/26/19  Yes Johnson, Megan P, DO  loratadine (CLARITIN) 10 MG tablet Take 10 mg by mouth daily as needed for allergies.    Yes [provider]  losartan (COZAAR) 100 MG tablet Take 1 tablet (100 mg total) by mouth daily. 05/26/19  Yes Johnson, Megan P, DO  omeprazole (PRILOSEC) 20 MG capsule Take 1 capsule (20 mg total) by mouth daily. 05/26/19  Yes Olevia Perches P, DO    Physical Exam: Vitals:   10/02/19 1500 10/02/19 1530 10/02/19 1600 10/02/19 1630  BP: 128/65 100/60 109/77 133/83  Pulse: 68 64 62 (!) 57  Resp: 13 (!) 21 18 10   Temp:      TempSrc:      SpO2: 96% 94% 97% 97%  Weight:      Height:          Constitutional: Anxious but not in acute distress Eyes: PERRL, lids and conjunctivae normal ENMT: Mucous membranes are moist. Posterior pharynx clear of any exudate or lesions.Normal dentition.  Neck: normal, supple, no masses, no thyromegaly Respiratory: clear to auscultation bilaterally, no wheezing, no crackles. Normal respiratory effort. No accessory muscle use.  Cardiovascular: Regular rate and rhythm, no murmurs / rubs / gallops. No extremity edema. 2+ pedal pulses. No carotid bruits.  Abdomen: no tenderness, no masses palpated. No hepatosplenomegaly. Bowel sounds positive.  Musculoskeletal: no clubbing / cyanosis. No joint deformity upper and lower extremities. Good ROM, no contractures. Normal muscle tone.  Skin: no rashes, lesions, ulcers. No induration Neurologic: CN 2-12 grossly intact. Sensation intact, DTR normal. Strength 5/5 in all 4.  Psychiatric: Normal judgment and insight. Alert and oriented x 3. Normal mood.     Labs on Admission: I have personally reviewed following labs and imaging studies  CBC: Recent Labs  Lab 10/02/19 1321  WBC  9.6  NEUTROABS 8.6*  HGB 11.1*  HCT 31.5*  MCV 101.6*  PLT 249   Basic Metabolic Panel: Recent Labs  Lab 10/02/19 1321  NA 126*  K 7.0*  CL 96*  CO2 13*  GLUCOSE 69*  BUN 63*  CREATININE 3.13*  CALCIUM 8.5*   GFR: Estimated Creatinine Clearance: 15.9 mL/min (A) (by C-G formula based on SCr of 3.13 mg/dL (H)). Liver Function Tests: Recent Labs  Lab 10/02/19 1321  AST 31  ALT 22  ALKPHOS 77  BILITOT 0.6  PROT 7.7  ALBUMIN 3.8   No results for input(s): LIPASE, AMYLASE in the last 168 hours. No results for input(s): AMMONIA in the last 168 hours. Coagulation Profile: No results for input(s): INR, PROTIME in the last 168 hours. Cardiac Enzymes: No results for input(s): CKTOTAL, CKMB, CKMBINDEX, TROPONINI in the last 168 hours. BNP (last 3 results) No results for input(s): PROBNP in the last 8760 hours. HbA1C: No results  for input(s): HGBA1C in the last 72 hours. CBG: Recent Labs  Lab 10/02/19 1539  GLUCAP 142*   Lipid Profile: No results for input(s): CHOL, HDL, LDLCALC, TRIG, CHOLHDL, LDLDIRECT in the last 72 hours. Thyroid Function Tests: No results for input(s): TSH, T4TOTAL, FREET4, T3FREE, THYROIDAB in the last 72 hours. Anemia Panel: No results for input(s): VITAMINB12, FOLATE, FERRITIN, TIBC, IRON, RETICCTPCT in the last 72 hours. Urine analysis:    Component Value Date/Time   COLORURINE YELLOW (A) 10/02/2019 1321   APPEARANCEUR HAZY (A) 10/02/2019 1321   APPEARANCEUR Cloudy (A) 05/26/2019 1336   LABSPEC 1.009 10/02/2019 1321   PHURINE 5.0 10/02/2019 1321   GLUCOSEU NEGATIVE 10/02/2019 1321   HGBUR NEGATIVE 10/02/2019 1321   BILIRUBINUR NEGATIVE 10/02/2019 1321   BILIRUBINUR Negative 05/26/2019 1336   KETONESUR NEGATIVE 10/02/2019 1321   PROTEINUR NEGATIVE 10/02/2019 1321   NITRITE NEGATIVE 10/02/2019 1321   LEUKOCYTESUR SMALL (A) 10/02/2019 1321   Sepsis Labs: !!!!!!!!!!!!!!!!!!!!!!!!!!!!!!!!!!!!!!!!!!!!  @LABRCNTIP (procalcitonin:4,lacticidven:4) )No results found for this or any previous visit (from the past 240 hour(s)).   Radiological Exams on Admission: Dg Chest Portable 1 View  Result Date: 10/02/2019 CLINICAL DATA:  Chest pain, cough EXAM: PORTABLE CHEST 1 VIEW COMPARISON:  07/11/2018 FINDINGS: Cardiomegaly. Both lungs are clear. Nonacute fracture deformities of the left lateral ribs. IMPRESSION: Cardiomegaly without acute abnormality of the lungs in AP portable projection. Electronically Signed   By: Lauralyn PrimesAlex  Bibbey M.D.   On: 10/02/2019 13:01     All images have been reviewed by me personally.  EKG: Independently reviewed.  Peaked T waves noted  Assessment/Plan Principal Problem:   AKI (acute kidney injury) (HCC) Active Problems:   COPD (chronic obstructive pulmonary disease) (HCC)   BARRETTS ESOPHAGUS   ADHD (attention deficit hyperactivity disorder)   Anemia, macrocytic   Hyperkalemia   Metabolic acidosis   Nausea & vomiting    Acute kidney injury, creatinine 3.13.  Baseline 1.6 Severe hyperkalemia with EKG changes Acute metabolic acidosis Hyponatremia -Admit the patient to the telemetry.  EKG showing peaked T waves -Sodium bicarb, calcium gluconate, albuterol, insulin, Lokelma given.  Plan on repeating electrolytes, if no improvement noted patient will require urgent dialysis.  Nephrology aware of the patient. -Supportive care.  Monitor urine output.  Avoid nephrotoxic drugs.  Nausea and vomiting -Secondary to electrolyte abnormalities.  Now feels better.  Will clinically monitor her, if necessary will obtain further abdominal imaging.  Essential hypertension -Atenolol, losartan and hydralazine on hold  GERD -PPI  COPD -Stable.  As needed bronchodilators   DVT prophylaxis: Subcutaneous heparin Code Status: Full code Family Communication: None Disposition Plan: To be determined Consults called: Nephrology Admission status: Inpatient admission to telemetry.   Acute kidney injury and severe hyperkalemia   Time Spent: 65 minutes.  >50% of the time was devoted to discussing the patients care, assessment, plan and disposition with other care givers along with counseling the patient about the risks and benefits of treatment.     Joline Maxcyhirag  MD [facility]  If 7PM-7AM, please contact night-coverage   10/02/2019, 4:59 PM

## 2019-10-02 NOTE — ED Notes (Signed)
Unable to obtain repeat BMP from IV. Lab notified, state they will send someone to collect. Pt updated.

## 2019-10-02 NOTE — ED Provider Notes (Signed)
Apolonio Schneiders, attending physician, personally viewed and interpreted this EKG  EKG Time: 1257 Rate: 108 Rhythm: sinus tachycardia Axis: normal Intervals: qtc 554 QRS: narrow ST changes: no st elevation Impression: abnormal Val Eagle, MD 10/02/19 1310

## 2019-10-02 NOTE — ED Triage Notes (Signed)
Pt here with c/o cough for 2 days now, denies cp, states she woke up this am and just "didn't feel well." Denies fever. NAD.

## 2019-10-02 NOTE — Progress Notes (Signed)
Talked to NP Ouma about patient's nausea and vomiting, cant have Zofran because she just had a dose, one time dose of compazine given. Also home meds has not started yet, order was place. Patient is also anxious, asked for PRN medication, valium given. RN will continue to monitor.

## 2019-10-02 NOTE — ED Notes (Signed)
Repeat EKG done with improved QT interval

## 2019-10-02 NOTE — ED Provider Notes (Signed)
[facility] Emergency Department Provider Note  ____________________________________________   First MD Initiated Contact with Patient 10/02/19 1231     (approximate)  I have reviewed the triage vital signs and the nursing notes.   HISTORY  Chief Complaint Cough   HPI Ashley [name] is a 68 y.o. female is brought to the ED via EMS from home with complaint of chest pain this morning that is now resolved.  She states she has been coughing clear sputum for 2 days.  Patient states "I do not feel well".  She is unaware of any fever, chills, nausea, vomiting or diarrhea.  She denies any known exposure to Covid.  Patient states she does have a history of COPD and reflux disease.  She is a former smoker.  Currently she rates her pain as 0/10.       Past Medical History:  Diagnosis Date  . ADHD (attention deficit hyperactivity disorder) 05/02/2015  . Anemia   . Anxiety   . Arthritis   . Attention deficit disorder   . Autoimmune liver disease   . Barrett's esophagus   . COPD (chronic obstructive pulmonary disease) (HCC)   . Depression   . DEPRESSION, HX OF 04/21/2008   Qualifier: Diagnosis of  By: Nelson-Smith CMA (AAMA), Dottie    . Gastritis   . GERD (gastroesophageal reflux disease)   . Hypertension   . Insomnia   . Internal hemorrhoids   . Liver failure (HCC)   . Migraine   . Ovarian failure   . Peptic ulcer disease   . PEPTIC ULCER DISEASE, HX OF 04/21/2008   Qualifier: Diagnosis of  By: Nelson-Smith CMA (AAMA), Dottie    . Small bowel obstruction (HCC)   . SMALL BOWEL OBSTRUCTION, HX OF 04/21/2008   Qualifier: Diagnosis of  By: Nelson-Smith CMA (AAMA), Dottie    . Status post right knee replacement 05/14/2018    Patient Active Problem List   Diagnosis Date Noted  . Anemia, macrocytic 06/20/2018  . Alcohol use 05/12/2018  . Vitamin B12 deficiency 05/12/2018  . Controlled substance agreement signed 04/10/2016  . Allergic rhinitis 09/19/2015   . Arthritis of knee, degenerative 09/19/2015  . ADHD (attention deficit hyperactivity disorder) 05/02/2015  . INTERNAL HEMORRHOIDS 04/21/2008  . COPD (chronic obstructive pulmonary disease) (HCC) 04/21/2008  . BARRETTS ESOPHAGUS 04/21/2008  . Benign essential HTN 01/26/2008  . Cannot sleep 01/26/2008  . Migraine with aura 01/26/2008    Past Surgical History:  Procedure Laterality Date  . ABDOMINAL HYSTERECTOMY    . DILATION AND CURETTAGE OF UTERUS    . LIVER BIOPSY    . OOPHORECTOMY    . TOTAL KNEE ARTHROPLASTY Right 05/14/2018   Procedure: TOTAL KNEE ARTHROPLASTY;  Surgeon: Lyndle Herrlich, MD;  Location: [facility] ORS;  Service: Orthopedics;  Laterality: Right;    Prior to Admission medications   Medication Sig Start Date End Date Taking? Authorizing Provider  atenolol (TENORMIN) 100 MG tablet Take 1 tablet (100 mg total) by mouth 2 (two) times daily. 05/26/19   Johnson, Megan P, DO  Cholecalciferol (VITAMIN D3) 5000 units CAPS Take 1 capsule by mouth daily.    [provider]  Cyanocobalamin (VITAMIN B 12 PO) Take 3,000 mcg by mouth daily.     [provider]  hydrALAZINE (APRESOLINE) 100 MG tablet Take 1 tablet (100 mg total) by mouth 3 (three) times daily. 05/26/19   Johnson, Megan P, DO  loratadine (CLARITIN) 10 MG tablet Take 10 mg by mouth  daily as needed for allergies.     [provider]  losartan (COZAAR) 100 MG tablet Take 1 tablet (100 mg total) by mouth daily. 05/26/19   Johnson, Megan P, DO  omeprazole (PRILOSEC) 20 MG capsule Take 1 capsule (20 mg total) by mouth daily. 05/26/19   Johnson, Megan P, DO  SUMAtriptan (IMITREX) 100 MG tablet Take 1 tablet (100 mg total) by mouth every 2 (two) hours as needed. Do not take more than 2 in 24 hours 05/26/19   Olevia PerchesJohnson, Megan P, DO  SUMAtriptan (IMITREX) 20 MG/ACT nasal spray Place 1 spray (20 mg total) into the nose once for 1 dose. May repeat in 2 hours if headache persists or recurs. 09/12/18 09/12/18  Olevia PerchesJohnson,  Megan P, DO    Allergies Amlodipine, Topamax [topiramate], and Tylenol [acetaminophen]  Family History  Problem Relation Age of Onset  . Heart failure Father   . Atrial fibrillation Mother   . Cerebral palsy Brother   . Heart attack Maternal Grandfather   . CAD Paternal Grandmother   . Colon cancer Neg Hx     Social History Social History   Tobacco Use  . Smoking status: Former Smoker    Quit date: 11/05/1988    Years since quitting: 30.9  . Smokeless tobacco: Never Used  Substance Use Topics  . Alcohol use: Yes    Alcohol/week: 2.0 standard drinks    Types: 2 Glasses of wine per week  . Drug use: No    Review of Systems Constitutional: No fever/chills Eyes: No visual changes. ENT: No sore throat. Cardiovascular: Positive chest pain. Respiratory: Denies shortness of breath.  Positive for productive cough. Gastrointestinal: No abdominal pain.  No nausea, no vomiting.  No diarrhea.  Genitourinary: Negative for dysuria. Musculoskeletal: Negative for back pain. Skin: Negative for rash. Neurological: Negative for headaches, focal weakness or numbness. ____________________________________________   PHYSICAL EXAM:  VITAL SIGNS: ED Triage Vitals  Enc Vitals Group     BP 10/02/19 1214 (!) 102/54     Pulse Rate 10/02/19 1210 62     Resp 10/02/19 1210 16     Temp 10/02/19 1210 97.8 F (36.6 C)     Temp Source 10/02/19 1210 Oral     SpO2 10/02/19 1210 98 %     Weight 10/02/19 1211 150 lb (68 kg)     Height 10/02/19 1211 5\' 5"  (1.651 m)     Head Circumference --      Peak Flow --      Pain Score 10/02/19 1210 0     Pain Loc --      Pain Edu? --      Excl. in GC? --    Constitutional: Alert and oriented. Well appearing and in no acute distress.  Patient is able to talk in complete sentences without any respiratory distress. Eyes: Conjunctivae are normal.  Head: Atraumatic. Nose: No congestion/rhinnorhea. Neck: No stridor.   Cardiovascular: Normal rate, regular  rhythm. Grossly normal heart sounds.  Good peripheral circulation. Respiratory: Normal respiratory effort.  No retractions. Lungs CTAB.  Frequent coarse cough noted during exam. Gastrointestinal: Soft and nontender. No distention. Neurologic:  Normal speech and language. No gross focal neurologic deficits are appreciated. No gait instability. Skin:  Skin is warm, dry and intact. No rash noted. Psychiatric: Mood and affect are normal. Speech and behavior are normal.  ____________________________________________   LABS (all labs ordered are listed, but only abnormal results are displayed)  Labs Reviewed  COMPREHENSIVE METABOLIC PANEL -  Abnormal; Notable for the following components:      Result Value   Sodium 126 (*)    Potassium 7.0 (*)    Chloride 96 (*)    CO2 13 (*)    Glucose, Bld 69 (*)    BUN 63 (*)    Creatinine, Ser 3.13 (*)    Calcium 8.5 (*)    GFR calc non Af Amer 15 (*)    GFR calc Af Amer 17 (*)    Anion gap 17 (*)    All other components within normal limits  CBC WITH DIFFERENTIAL/PLATELET - Abnormal; Notable for the following components:   RBC 3.10 (*)    Hemoglobin 11.1 (*)    HCT 31.5 (*)    MCV 101.6 (*)    MCH 35.8 (*)    Neutro Abs 8.6 (*)    Lymphs Abs 0.6 (*)    All other components within normal limits  URINALYSIS, COMPLETE (UACMP) WITH MICROSCOPIC - Abnormal; Notable for the following components:   Color, Urine YELLOW (*)    APPearance HAZY (*)    Leukocytes,Ua SMALL (*)    Bacteria, UA MANY (*)    All other components within normal limits  TROPONIN I (HIGH SENSITIVITY)  TROPONIN I (HIGH SENSITIVITY)   ____________________________________________  EKG  EKG was reviewed by Dr. Derrill Kay.  Patient with sinus tachycardia and a ventricular rate of 108.  Frequent PVCs ____________________________________________  RADIOLOGY   Official radiology report(s): Dg Chest Portable 1 View  Result Date: 10/02/2019 CLINICAL DATA:  Chest pain, cough EXAM:  PORTABLE CHEST 1 VIEW COMPARISON:  07/11/2018 FINDINGS: Cardiomegaly. Both lungs are clear. Nonacute fracture deformities of the left lateral ribs. IMPRESSION: Cardiomegaly without acute abnormality of the lungs in AP portable projection. Electronically Signed   By: Lauralyn Primes M.D.   On: 10/02/2019 13:01    ____________________________________________   PROCEDURES  Procedure(s) performed (including Critical Care):  Procedures   ____________________________________________   INITIAL IMPRESSION / ASSESSMENT AND PLAN / ED COURSE  As part of my medical decision making, I reviewed the following data within the electronic MEDICAL RECORD NUMBER Notes from prior ED visits and [place] Controlled Substance Database STORMY CONNON was evaluated in Emergency Department on 10/02/2019 for the symptoms described in the history of present illness. She was evaluated in the context of the global COVID-19 pandemic, which necessitated consideration that the patient might be at risk for infection with the SARS-CoV-2 virus that causes COVID-19. Institutional protocols and algorithms that pertain to the evaluation of patients at risk for COVID-19 are in a state of rapid change based on information released by regulatory bodies including the CDC and federal and state organizations. These policies and algorithms were followed during the patient's care in the ED.  68 year old female presents to the ED with complaint of chest pain this morning with a history of clear productive cough for the last 2 days.  She also reports "I just do not feel well".  Lab work shows a potassium of 7, elevated BUN and creatinine and chest x-ray shows cardiomegaly that is not been reported on other chest x-rays that she has had in the past.  Patient was unaware of any past history of problems other than a urinary tract infection.  I spoke with Dr. Roxan Hockey who agreed that patient needs to be moved to the major side of the ED.   ____________________________________________   FINAL CLINICAL IMPRESSION(S) / ED DIAGNOSES  Final diagnoses:  Hyperkalemia  Acute renal failure, unspecified  acute renal failure type [facility])  Cardiomegaly  Chest pain, unspecified type     ED Discharge Orders    None       Note:  This document was prepared using Dragon voice recognition software and may include unintentional dictation errors.    Johnn Hai, PA-C 10/02/19 1440    Merlyn Lot, MD 10/02/19 1537

## 2019-10-02 NOTE — ED Notes (Signed)
First nurse note: Here with EMS from home with c/o cough with clear sputum, cp this am, EKG per EMS WNL, 98.3, 104/59, 66, 95% RA, has not been tested for Covid. Hx of COPD.

## 2019-10-03 LAB — COMPREHENSIVE METABOLIC PANEL
ALT: 17 U/L (ref 0–44)
AST: 20 U/L (ref 15–41)
Albumin: 3.5 g/dL (ref 3.5–5.0)
Alkaline Phosphatase: 62 U/L (ref 38–126)
Anion gap: 11 (ref 5–15)
BUN: 50 mg/dL — ABNORMAL HIGH (ref 8–23)
CO2: 20 mmol/L — ABNORMAL LOW (ref 22–32)
Calcium: 8.4 mg/dL — ABNORMAL LOW (ref 8.9–10.3)
Chloride: 101 mmol/L (ref 98–111)
Creatinine, Ser: 1.38 mg/dL — ABNORMAL HIGH (ref 0.44–1.00)
GFR calc Af Amer: 46 mL/min — ABNORMAL LOW (ref >60)
GFR calc non Af Amer: 40 mL/min — ABNORMAL LOW (ref >60)
Glucose, Bld: 86 mg/dL (ref 70–99)
Potassium: 4.5 mmol/L (ref 3.5–5.1)
Sodium: 132 mmol/L — ABNORMAL LOW (ref 135–145)
Total Bilirubin: 1 mg/dL (ref 0.3–1.2)
Total Protein: 6.7 g/dL (ref 6.5–8.1)

## 2019-10-03 LAB — GLUCOSE, CAPILLARY
Glucose-Capillary: 97 mg/dL (ref 70–99)
Glucose-Capillary: 149 mg/dL — ABNORMAL HIGH (ref 70–99)
Glucose-Capillary: 104 mg/dL — ABNORMAL HIGH (ref 70–99)

## 2019-10-03 LAB — CBC
HCT: 25.5 % — ABNORMAL LOW (ref 36.0–46.0)
Hemoglobin: 9.2 g/dL — ABNORMAL LOW (ref 12.0–15.0)
MCH: 35.8 pg — ABNORMAL HIGH (ref 26.0–34.0)
MCHC: 36.1 g/dL — ABNORMAL HIGH (ref 30.0–36.0)
MCV: 99.2 fL (ref 80.0–100.0)
Platelets: 221 10*3/uL (ref 150–400)
RBC: 2.57 MIL/uL — ABNORMAL LOW (ref 3.87–5.11)
RDW: 13.2 % (ref 11.5–15.5)
WBC: 5.8 10*3/uL (ref 4.0–10.5)
nRBC: 0 % (ref 0.0–0.2)

## 2019-10-03 LAB — SARS CORONAVIRUS 2 (TAT 6-24 HRS): SARS Coronavirus 2: NEGATIVE

## 2019-10-03 MED ORDER — MECLIZINE HCL 25 MG PO TABS
25.0000 mg | ORAL_TABLET | ORAL | Status: DC
  Filled 2019-10-03: qty 1

## 2019-10-03 MED ORDER — MECLIZINE HCL 12.5 MG PO TABS
12.5000 mg | ORAL_TABLET | Freq: Three times a day (TID) | ORAL | Status: DC
  Administered 2019-10-03 – 2019-10-04 (×4): 12.5 mg via ORAL
  Filled 2019-10-03 (×6): qty 1

## 2019-10-03 MED ORDER — SODIUM BICARBONATE 650 MG PO TABS
650.0000 mg | ORAL_TABLET | Freq: Once | ORAL | Status: AC
  Administered 2019-10-03: 650 mg via ORAL
  Filled 2019-10-03: qty 1

## 2019-10-03 MED ORDER — SALINE SPRAY 0.65 % NA SOLN
1.0000 | NASAL | Status: DC | PRN
  Administered 2019-10-03: 1 via NASAL
  Filled 2019-10-03: qty 44

## 2019-10-03 MED ORDER — MECLIZINE HCL 25 MG PO TABS
25.0000 mg | ORAL_TABLET | Freq: Four times a day (QID) | ORAL | Status: DC | PRN
  Administered 2019-10-03: 05:00:00 25 mg via ORAL
  Filled 2019-10-03 (×2): qty 1

## 2019-10-03 MED ORDER — HYDROCOD POLST-CPM POLST ER 10-8 MG/5ML PO SUER
5.0000 mL | Freq: Two times a day (BID) | ORAL | Status: DC | PRN
  Administered 2019-10-03 – 2019-10-04 (×2): 5 mL via ORAL
  Filled 2019-10-03 (×2): qty 5

## 2019-10-03 NOTE — Consult Note (Signed)
[facility] CONSULT NOTE    Date: 10/03/2019                  Patient Name:  Ashley [name]  MRN: [id number]  DOB: [birth year]  Age / Sex: 68 y.o., female         PCP: Dorcas CarrowJohnson, Megan P, DO                 Service Requesting Consult:  Hospitalist                  Reason for Consult:  Acute kidney injury            History of Present Illness: Patient is a 68 y.o. female with a PMHx of attention deficit disorder, autoimmune liver disease, Barrett's esophagus, COPD, depression, GERD, hypertension, migraine headaches, peptic ulcer disease, who was admitted to [facility] on 10/02/2019 for evaluation of nausea and vomiting.  2 days prior to admission she was having nauseousness and diminished p.o. intake.  Upon presentation here she was found to have significant hyperkalemia with a potassium of 7 and BUN of 63 and creatinine of 3.13.  Patient was noted to be on losartan while at home.  With IV fluid hydration creatinine down to 1.38 with a potassium of 4.5.  Her baseline creatinine appears to be 0.87.   Medications: Outpatient medications: Medications Prior to Admission  Medication Sig Dispense Refill Last Dose  . atenolol (TENORMIN) 100 MG tablet Take 1 tablet (100 mg total) by mouth 2 (two) times daily. 180 tablet 1 10/02/2019 at Unknown time  . Cholecalciferol (VITAMIN D3) 5000 units CAPS Take 1 capsule by mouth daily.   10/02/2019 at Unknown time  . Cyanocobalamin (VITAMIN B 12 PO) Take 3,000 mcg by mouth daily.    10/02/2019 at Unknown time  . hydrALAZINE (APRESOLINE) 100 MG tablet Take 1 tablet (100 mg total) by mouth 3 (three) times daily. 270 tablet 1 10/02/2019 at Unknown time  . loratadine (CLARITIN) 10 MG tablet Take 10 mg by mouth daily as needed for allergies.    10/02/2019 at Unknown time  . losartan (COZAAR) 100 MG tablet Take 1 tablet (100 mg total) by mouth daily. 90 tablet 1 10/02/2019 at Unknown time  . omeprazole (PRILOSEC) 20 MG capsule Take 1 capsule  (20 mg total) by mouth daily. 90 capsule 1 10/02/2019 at Unknown time    Current medications: Current Facility-Administered Medications  Medication Dose Route Frequency Provider Last Rate Last Dose  . 0.9 %  sodium chloride infusion   Intravenous Continuous Amin, Ankit Chirag, MD 75 mL/hr at 10/03/19 1506    . acetaminophen (TYLENOL) tablet 650 mg  650 mg Oral Q6H PRN Amin, Ankit Chirag, MD       Or  . acetaminophen (TYLENOL) suppository 650 mg  650 mg Rectal Q6H PRN Amin, Ankit Chirag, MD      . atenolol (TENORMIN) tablet 100 mg  100 mg Oral BID Jimmye Normanuma, Elizabeth Achieng, NP   100 mg at 10/03/19 0955  . diazepam (VALIUM) tablet 2 mg  2 mg Oral Q6H PRN Jimmye Normanuma, Elizabeth Achieng, NP   2 mg at 10/03/19 0954  . guaiFENesin-dextromethorphan (ROBITUSSIN DM) 100-10 MG/5ML syrup 5 mL  5 mL Oral Q4H PRN Amin, Ankit Chirag, MD   5 mL at 10/02/19 2329  . heparin injection 5,000 Units  5,000 Units Subcutaneous Q8H Amin, Ankit Chirag, MD   5,000 Units at 10/03/19 1242  . hydrALAZINE (APRESOLINE) tablet 100 mg  100 mg Oral TID Lang Snow, NP   100 mg at 10/03/19 0954  . meclizine (ANTIVERT) tablet 12.5 mg  12.5 mg Oral TID Nolberto Hanlon, MD   12.5 mg at 10/03/19 1242  . ondansetron (ZOFRAN) tablet 4 mg  4 mg Oral Q6H PRN Amin, Ankit Chirag, MD       Or  . ondansetron (ZOFRAN) injection 4 mg  4 mg Intravenous Q6H PRN Amin, Ankit Chirag, MD   4 mg at 10/02/19 1827  . pantoprazole (PROTONIX) EC tablet 40 mg  40 mg Oral Daily Amin, Ankit Chirag, MD   40 mg at 10/03/19 0955  . pneumococcal 23 valent vaccine (PNEUMOVAX-23) injection 0.5 mL  0.5 mL Intramuscular Tomorrow-1000 Amin, Ankit Chirag, MD      . senna-docusate (Senokot-S) tablet 1 tablet  1 tablet Oral QHS PRN Amin, Ankit Chirag, MD      . sodium chloride (OCEAN) 0.65 % nasal spray 1 spray  1 spray Each Nare PRN Nolberto Hanlon, MD      . vitamin B-12 (CYANOCOBALAMIN) tablet 3,000 mcg  3,000 mcg Oral Daily Lang Snow, NP   3,000 mcg at  10/03/19 9242      Allergies: Allergies  Allergen Reactions  . Topiramate Other (See Comments)    "shut my body down." "shut my body down."  . Amlodipine Swelling  . Tylenol [Acetaminophen] Other (See Comments)    Elevated liver enzymes       Past Medical History: Past Medical History:  Diagnosis Date  . ADHD (attention deficit hyperactivity disorder) 05/02/2015  . Anemia   . Anxiety   . Arthritis   . Attention deficit disorder   . Autoimmune liver disease   . Barrett's esophagus   . COPD (chronic obstructive pulmonary disease) ([place])   . Depression   . DEPRESSION, HX OF 04/21/2008   Qualifier: Diagnosis of  By: Nelson-Smith CMA (AAMA), Dottie    . Gastritis   . GERD (gastroesophageal reflux disease)   . Hypertension   . Insomnia   . Internal hemorrhoids   . Liver failure ([place])   . Migraine   . Ovarian failure   . Peptic ulcer disease   . PEPTIC ULCER DISEASE, HX OF 04/21/2008   Qualifier: Diagnosis of  By: Nelson-Smith CMA (AAMA), Dottie    . Small bowel obstruction ([place])   . SMALL BOWEL OBSTRUCTION, HX OF 04/21/2008   Qualifier: Diagnosis of  By: Nelson-Smith CMA (AAMA), Dottie    . Status post right knee replacement 05/14/2018     Past Surgical History: Past Surgical History:  Procedure Laterality Date  . ABDOMINAL HYSTERECTOMY    . DILATION AND CURETTAGE OF UTERUS    . LIVER BIOPSY    . OOPHORECTOMY    . TOTAL KNEE ARTHROPLASTY Right 05/14/2018   Procedure: TOTAL KNEE ARTHROPLASTY;  Surgeon: Lovell Sheehan, MD;  Location: [facility] ORS;  Service: Orthopedics;  Laterality: Right;     Family History: Family History  Problem Relation Age of Onset  . Heart failure Father   . Atrial fibrillation Mother   . Cerebral palsy Brother   . Heart attack Maternal Grandfather   . CAD Paternal Grandmother   . Colon cancer Neg Hx      Social History: Social History   Socioeconomic History  . Marital status: Divorced    Spouse name: Not on file  . Number of  children: Not on file  . Years of education: Not on file  . Highest education level:  Not on file  Occupational History  . Not on file  Social Needs  . Financial resource strain: Not on file  . Food insecurity    Worry: Not on file    Inability: Not on file  . Transportation needs    Medical: Not on file    Non-medical: Not on file  Tobacco Use  . Smoking status: Former Smoker    Quit date: 11/05/1988    Years since quitting: 30.9  . Smokeless tobacco: Never Used  Substance and Sexual Activity  . Alcohol use: Yes    Alcohol/week: 2.0 standard drinks    Types: 2 Glasses of wine per week  . Drug use: No  . Sexual activity: Never  Lifestyle  . Physical activity    Days per week: Not on file    Minutes per session: Not on file  . Stress: Not on file  Relationships  . Social Musician on phone: Not on file    Gets together: Not on file    Attends religious service: Not on file    Active member of club or organization: Not on file    Attends meetings of clubs or organizations: Not on file    Relationship status: Not on file  . Intimate partner violence    Fear of current or ex partner: Not on file    Emotionally abused: Not on file    Physically abused: Not on file    Forced sexual activity: Not on file  Other Topics Concern  . Not on file  Social History Narrative  . Not on file     Review of Systems: Review of Systems  Constitutional: Negative for chills and fever.  HENT: Negative for hearing loss and tinnitus.   Eyes: Negative for blurred vision and double vision.  Respiratory: Negative for cough, hemoptysis and shortness of breath.   Cardiovascular: Negative for chest pain, palpitations and orthopnea.  Gastrointestinal: Positive for nausea and vomiting. Negative for abdominal pain.  Genitourinary: Negative for dysuria and urgency.  Musculoskeletal: Negative for joint pain and myalgias.  Skin: Negative for rash.  Neurological: Positive for dizziness  and weakness.  Endo/Heme/Allergies: Negative for polydipsia. Does not bruise/bleed easily.  Psychiatric/Behavioral: Negative for depression. The patient is not nervous/anxious.      Vital Signs: Blood pressure 126/71, pulse 73, temperature 98.7 F (37.1 C), temperature source Oral, resp. rate 18, height  (1.651 m), weight 70.9 kg, SpO2 97 %.  Weight trends: Filed Weights   10/02/19 1211 10/02/19 2009 10/03/19 0340  Weight: 68 kg 71.9 kg 70.9 kg    Physical Exam: General: NAD, no acute distress  Head: Normocephalic, atraumatic.  Eyes: Anicteric, EOMI  Nose: Mucous membranes moist, not inflammed, nonerythematous.  Throat: Oropharynx nonerythematous, no exudate appreciated.   Neck: Supple, trachea midline.  Lungs:  Normal respiratory effort. Clear to auscultation BL without crackles or wheezes.  Heart: RRR. S1 and S2 normal without gallop, murmur, or rubs.  Abdomen:  BS normoactive. Soft, Nondistended, non-tender.  No masses or organomegaly.  Extremities: No pretibial edema.  Neurologic: A&O X3, Motor strength is 5/5 in the all 4 extremities  Skin: No visible rashes, scars.    Lab results: Basic Metabolic Panel: Recent Labs  Lab 10/02/19 1321 10/02/19 1752 10/03/19 0543  NA 126* 129* 132*  K 7.0* 4.7 4.5  CL 96* 97* 101  CO2 13* 14* 20*  GLUCOSE 69* 77 86  BUN 63* 58* 50*  CREATININE 3.13* 2.29* 1.38*  CALCIUM 8.5* 8.5* 8.4*    Liver Function Tests: Recent Labs  Lab 10/02/19 1321 10/03/19 0543  AST 31 20  ALT 22 17  ALKPHOS 77 62  BILITOT 0.6 1.0  PROT 7.7 6.7  ALBUMIN 3.8 3.5   No results for input(s): LIPASE, AMYLASE in the last 168 hours. No results for input(s): AMMONIA in the last 168 hours.  CBC: Recent Labs  Lab 10/02/19 1321 10/03/19 0543  WBC 9.6 5.8  NEUTROABS 8.6*  --   HGB 11.1* 9.2*  HCT 31.5* 25.5*  MCV 101.6* 99.2  PLT 249 221    Cardiac Enzymes: No results for input(s): CKTOTAL, CKMB, CKMBINDEX, TROPONINI in the last 168  hours.  BNP: Invalid input(s): POCBNP  CBG: Recent Labs  Lab 10/02/19 1539 10/03/19 0749 10/03/19 1203  GLUCAP 142* 97 149*    Microbiology: Results for orders placed or performed during the hospital encounter of 10/02/19  SARS CORONAVIRUS 2 (TAT 6-24 HRS) Nasopharyngeal Nasopharyngeal Swab     Status: None   Collection Time: 10/02/19  6:28 PM   Specimen: Nasopharyngeal Swab  Result Value Ref Range Status   SARS Coronavirus 2 NEGATIVE NEGATIVE Final    Comment: (NOTE) SARS-CoV-2 target nucleic acids are NOT DETECTED. The SARS-CoV-2 RNA is generally detectable in upper and lower respiratory specimens during the acute phase of infection. Negative results do not preclude SARS-CoV-2 infection, do not rule out co-infections with other pathogens, and should not be used as the sole basis for treatment or other patient management decisions. Negative results must be combined with clinical observations, patient history, and epidemiological information. The expected result is Negative. Fact Sheet for Patients: HairSlick.no Fact Sheet for Healthcare Providers: quierodirigir.com This test is not yet approved or cleared by the [place] FDA and  has been authorized for detection and/or diagnosis of SARS-CoV-2 by FDA under an Emergency Use Authorization (EUA). This EUA will remain  in effect (meaning this test can be used) for the duration of the COVID-19 declaration under Section 56 4(b)(1) of the Act, 21 U.S.C. section 360bbb-3(b)(1), unless the authorization is terminated or revoked sooner. Performed at [facility], [address] [place], [place] [address]     Coagulation Studies: No results for input(s): LABPROT, INR in the last 72 hours.  Urinalysis: Recent Labs    10/02/19 1321  COLORURINE YELLOW*  LABSPEC 1.009  PHURINE 5.0  GLUCOSEU NEGATIVE  HGBUR NEGATIVE  BILIRUBINUR NEGATIVE  KETONESUR NEGATIVE   PROTEINUR NEGATIVE  NITRITE NEGATIVE  LEUKOCYTESUR SMALL*      Imaging: US Renal  Result Date: 10/02/2019 CLINICAL DATA:  Acute kidney injury EXAM: RENAL / URINARY TRACT ULTRASOUND COMPLETE COMPARISON:  None. FINDINGS: Right Kidney: Renal measurements: 9.8 x 4.9 x 4.9 cm = volume: 124 mL . Echogenicity within normal limits. No mass or hydronephrosis visualized. Left Kidney: Renal measurements: 9.3 x 4.7 x 4.8 cm = volume: 109 mL. Echogenicity within normal limits. No mass or hydronephrosis visualized. Bladder: Appears normal for degree of bladder distention. Other: None. IMPRESSION: Normal study Electronically Signed   By: Katherine Mantle M.D.   On: 10/02/2019 19:03   Dg Chest Portable 1 View  Result Date: 10/02/2019 CLINICAL DATA:  Chest pain, cough EXAM: PORTABLE CHEST 1 VIEW COMPARISON:  07/11/2018 FINDINGS: Cardiomegaly. Both lungs are clear. Nonacute fracture deformities of the left lateral ribs. IMPRESSION: Cardiomegaly without acute abnormality of the lungs in AP portable projection. Electronically Signed   By: Lauralyn Primes M.D.   On: 10/02/2019 13:01  Assessment & Plan: Pt is a 68 y.o. female with a PMHx of attention deficit disorder, autoimmune liver disease, Barrett's esophagus, COPD, depression, GERD, hypertension, migraine headaches, peptic ulcer disease, who was admitted to [facility] on 10/02/2019 for evaluation of nausea and vomiting.  1.  Acute kidney injury with hyperkalemia.  Suspect that the patient developed volume depletion when she had nausea and vomiting.  Patient was also on losartan at home.  Potassium significantly improved with conservative measures.  No indication for dialysis.  Okay to maintain the patient on 0.9 normal saline.  Follow-up renal parameters tomorrow.  Renal ultrasound negative for hydronephrosis.  2.  Hypertension.  Agree with holding losartan.  Maintain the patient on hydralazine for now.

## 2019-10-03 NOTE — Progress Notes (Signed)
PROGRESS NOTE    Ashley [name]  CBJ:[id number] DOB: 26-Dec-1950 DOA: 10/02/2019 PCP: Valerie Roys, DO    Brief Narrative:  Ashley [name] is a 68 y.o. female with medical history significant of essential hypertension, GERD came to the hospital with complaints of nausea and vomiting.   Found with AKI and hyperkalemia. EKG showed T wave changes therefore she was given calcium carbonate, sodium bicarb, insulin, Lokelma and albuterol.  Nephrology was consulted.  Medical team was requested to admit the patient.     Consultants:   Nephrology  Procedures: Renal ultrasound-normal study  Antimicrobials:   None   Subjective: Patient feeling better.  Little nauseous yesterday but no vomiting or nausea today.  Denies abdominal pain, shortness of breath, or any other symptoms.  She does reveal having dizziness when she moves her head in certain positions.  She does also report she has had vertigo in the past and was taking meclizine for it.  Objective: Vitals:   10/02/19 2332 10/03/19 0340 10/03/19 0751 10/03/19 0947  BP: 112/68 (!) 144/76 (!) 149/79 118/68  Pulse: 69 67 72 69  Resp:  18 18   Temp:  98.3 F (36.8 C) 98.1 F (36.7 C)   TempSrc:  Oral Oral   SpO2:  92% 93%   Weight:  70.9 kg    Height:        Intake/Output Summary (Last 24 hours) at 10/03/2019 1128 Last data filed at 10/03/2019 1011 Gross per 24 hour  Intake 1016.25 ml  Output 1400 ml  Net -383.75 ml   Filed Weights   10/02/19 1211 10/02/19 2009 10/03/19 0340  Weight: 68 kg 71.9 kg 70.9 kg    Examination:  General exam: Appears calm and comfortable, NAD Respiratory system: Clear to auscultation. Respiratory effort normal. Cardiovascular system: S1 & S2 heard, RRR. No JVD, murmurs, rubs, gallops or clicks Gastrointestinal system: Abdomen is nondistended, soft and nontender.Normal bowel sounds heard. Central nervous system: Alert and oriented x3. No focal neurological deficits. Extremities: No  edema or cyanosis Skin: Warm and dry Psychiatry: Judgement and insight appear normal. Mood & affect appropriate.     Data Reviewed: I have personally reviewed following labs and imaging studies  CBC: Recent Labs  Lab 10/02/19 1321 10/03/19 0543  WBC 9.6 5.8  NEUTROABS 8.6*  --   HGB 11.1* 9.2*  HCT 31.5* 25.5*  MCV 101.6* 99.2  PLT 249 160   Basic Metabolic Panel: Recent Labs  Lab 10/02/19 1321 10/02/19 1752 10/03/19 0543  NA 126* 129* 132*  K 7.0* 4.7 4.5  CL 96* 97* 101  CO2 13* 14* 20*  GLUCOSE 69* 77 86  BUN 63* 58* 50*  CREATININE 3.13* 2.29* 1.38*  CALCIUM 8.5* 8.5* 8.4*   GFR: Estimated Creatinine Clearance: 39.6 mL/min (A) (by C-G formula based on SCr of 1.38 mg/dL (H)). Liver Function Tests: Recent Labs  Lab 10/02/19 1321 10/03/19 0543  AST 31 20  ALT 22 17  ALKPHOS 77 62  BILITOT 0.6 1.0  PROT 7.7 6.7  ALBUMIN 3.8 3.5   No results for input(s): LIPASE, AMYLASE in the last 168 hours. No results for input(s): AMMONIA in the last 168 hours. Coagulation Profile: No results for input(s): INR, PROTIME in the last 168 hours. Cardiac Enzymes: No results for input(s): CKTOTAL, CKMB, CKMBINDEX, TROPONINI in the last 168 hours. BNP (last 3 results) No results for input(s): PROBNP in the last 8760 hours. HbA1C: No results for input(s): HGBA1C in the last 72 hours.  CBG: Recent Labs  Lab 10/02/19 1539 10/03/19 0749  GLUCAP 142* 97   Lipid Profile: No results for input(s): CHOL, HDL, LDLCALC, TRIG, CHOLHDL, LDLDIRECT in the last 72 hours. Thyroid Function Tests: No results for input(s): TSH, T4TOTAL, FREET4, T3FREE, THYROIDAB in the last 72 hours. Anemia Panel: No results for input(s): VITAMINB12, FOLATE, FERRITIN, TIBC, IRON, RETICCTPCT in the last 72 hours. Sepsis Labs: No results for input(s): PROCALCITON, LATICACIDVEN in the last 168 hours.  Recent Results (from the past 240 hour(s))  SARS CORONAVIRUS 2 (TAT 6-24 HRS) Nasopharyngeal  Nasopharyngeal Swab     Status: None   Collection Time: 10/02/19  6:28 PM   Specimen: Nasopharyngeal Swab  Result Value Ref Range Status   SARS Coronavirus 2 NEGATIVE NEGATIVE Final    Comment: (NOTE) SARS-CoV-2 target nucleic acids are NOT DETECTED. The SARS-CoV-2 RNA is generally detectable in upper and lower respiratory specimens during the acute phase of infection. Negative results do not preclude SARS-CoV-2 infection, do not rule out co-infections with other pathogens, and should not be used as the sole basis for treatment or other patient management decisions. Negative results must be combined with clinical observations, patient history, and epidemiological information. The expected result is Negative. Fact Sheet for Patients: HairSlick.nohttps://www.fda.gov/media/138098/download Fact Sheet for Healthcare Providers: quierodirigir.comhttps://www.fda.gov/media/138095/download This test is not yet approved or cleared by the [place] FDA and  has been authorized for detection and/or diagnosis of SARS-CoV-2 by FDA under an Emergency Use Authorization (EUA). This EUA will remain  in effect (meaning this test can be used) for the duration of the COVID-19 declaration under Section 56 4(b)(1) of the Act, 21 U.S.C. section 360bbb-3(b)(1), unless the authorization is terminated or revoked sooner. Performed at [facility], [address] [place], [place] [address]          Radiology Studies: [place] Renal  Result Date: 10/02/2019 CLINICAL DATA:  Acute kidney injury EXAM: RENAL / URINARY TRACT ULTRASOUND COMPLETE COMPARISON:  None. FINDINGS: Right Kidney: Renal measurements: 9.8 x 4.9 x 4.9 cm = volume: 124 mL . Echogenicity within normal limits. No mass or hydronephrosis visualized. Left Kidney: Renal measurements: 9.3 x 4.7 x 4.8 cm = volume: 109 mL. Echogenicity within normal limits. No mass or hydronephrosis visualized. Bladder: Appears normal for degree of bladder distention. Other: None.  IMPRESSION: Normal study Electronically Signed   By: Katherine Mantlehristopher  Green M.D.   On: 10/02/2019 19:03   Dg Chest Portable 1 View  Result Date: 10/02/2019 CLINICAL DATA:  Chest pain, cough EXAM: PORTABLE CHEST 1 VIEW COMPARISON:  07/11/2018 FINDINGS: Cardiomegaly. Both lungs are clear. Nonacute fracture deformities of the left lateral ribs. IMPRESSION: Cardiomegaly without acute abnormality of the lungs in AP portable projection. Electronically Signed   By: Lauralyn PrimesAlex  Bibbey M.D.   On: 10/02/2019 13:01        Scheduled Meds: . atenolol  100 mg Oral BID  . heparin  5,000 Units Subcutaneous Q8H  . hydrALAZINE  100 mg Oral TID  . meclizine  12.5 mg Oral TID  . pantoprazole  40 mg Oral Daily  . pneumococcal 23 valent vaccine  0.5 mL Intramuscular Tomorrow-1000  . vitamin B-12  3,000 mcg Oral Daily   Continuous Infusions: . sodium chloride 75 mL/hr at 10/03/19 0725    Assessment & Plan:   Principal Problem:   AKI (acute kidney injury) (HCC) Active Problems:   COPD (chronic obstructive pulmonary disease) (HCC)   BARRETTS ESOPHAGUS   ADHD (attention deficit hyperactivity disorder)   Anemia,  macrocytic   Hyperkalemia   Metabolic acidosis   Nausea & vomiting  Acute kidney injury, creatinine 3.13.  Baseline 1.6-Possibly due to nausea vomiting.  Renal ultrasound was unremarkable Renal function has improved with IV fluids, will continue for now  Hyperkalemia-  EKG showing peaked T waves -Received sodium bicarb, calcium gluconate, albuterol, insulin, Lokelma on presentation  On telemetry  Potassium levels stabilized .  Discussed low potassium diet and also discuss discontinuing losartan for good. She verbalizes an understanding Avoid nephrotoxic drugs.  Hyponatremia-likely due to hypovolemia Has improved with IV hydration   Nausea and vomiting -primary vs secondary to electrolyte abnormalities.    Unsure if nausea and vomiting caused AKI from prerenal azotemia or vice versa   Clinically has improved   Essential hypertension -Continue atenolol and hydralazine Should not be restarted on losartan 2/2 hyperkalemia/aki  Vertigo- Has history of vertigo and has been on meclizine in the past. We will start her on meclizine 12.5 mg 3 times daily increase as needed  GERD -PPI  COPD -Stable.  As needed bronchodilators   DVT prophylaxis: Subcutaneous heparin Code Status: Full code Family Communication: None Disposition:possible d/c in am if medically stable   LOS: 1 day   Time spent: 45 minutes with more than 50% COC    Lynn Ito, MD [facility] Pager [phone]  If 7PM-7AM, please contact night-coverage [email or web address] Password [facility] 10/03/2019, 11:28 AM

## 2019-10-04 LAB — BASIC METABOLIC PANEL
Anion gap: 10 (ref 5–15)
BUN: 35 mg/dL — ABNORMAL HIGH (ref 8–23)
CO2: 20 mmol/L — ABNORMAL LOW (ref 22–32)
Calcium: 8.7 mg/dL — ABNORMAL LOW (ref 8.9–10.3)
Chloride: 107 mmol/L (ref 98–111)
Creatinine, Ser: 0.87 mg/dL (ref 0.44–1.00)
GFR calc Af Amer: >60 mL/min (ref >60)
GFR calc non Af Amer: >60 mL/min (ref >60)
Glucose, Bld: 139 mg/dL — ABNORMAL HIGH (ref 70–99)
Potassium: 4.1 mmol/L (ref 3.5–5.1)
Sodium: 137 mmol/L (ref 135–145)

## 2019-10-04 LAB — GLUCOSE, CAPILLARY: Glucose-Capillary: 84 mg/dL (ref 70–99)

## 2019-10-04 MED ORDER — SALINE SPRAY 0.65 % NA SOLN: 1.0000 | NASAL | 0 refills | Status: AC | PRN

## 2019-10-04 MED ORDER — HYDROCOD POLST-CPM POLST ER 10-8 MG/5ML PO SUER: 5.0000 mL | Freq: Two times a day (BID) | ORAL | 0 refills | Status: AC | PRN

## 2019-10-04 MED ORDER — MECLIZINE HCL 12.5 MG PO TABS: 12.5000 mg | ORAL_TABLET | Freq: Three times a day (TID) | ORAL | 0 refills | Status: AC

## 2019-10-04 NOTE — Progress Notes (Addendum)
IV and telemetry removed for discharge. Medication and discharge instructions reviewed with pt. Understanding verbalized, pt wheeled out and assisted into vehicle. No distress noted.

## 2019-10-04 NOTE — Discharge Summary (Signed)
Ashley [name] UJW:[id number] DOB: 1951/07/23 DOA: 10/02/2019  PCP: Dorcas Carrow, DO  Admit date: 10/02/2019 Discharge date: 10/04/2019  Admitted From: home Disposition:  home  Recommendations for Outpatient Follow-up:  1. Follow up with PCP in 1 week 2. Please obtain BMP/CBC in one week 3. Please follow up on the following pending results:none  Home Health:none    Discharge Condition:Stable CODE STATUS:full  Diet recommendation: Carb Modified Brief/Interim Summary: Ashley Gilleland Catlettis a 68 y.o.femalewith medical history significant ofessential hypertension, GERD came to the hospital with complaints of nausea and vomiting. Found with AKI and hyperkalemia. EKG showed T wave changes therefore she was given calcium carbonate, sodium bicarb, insulin, Lokelma and albuterol. Nephrology was consulted. She was admitted to the hospitalist service.  Her losartan was discontinued indefinitely.  Her potassium finally stabilized.  She was started on IV fluids with improvement of her renal function.  Her sound which was normal study.  Output.  Nephrology also agree with holding losartan.  Patient was also counseled on low potassium diet as she eats a lot of potatoes which is high in potassium.  With hyponatremia which was likely to hypovolemia and improved with IV hydration.  Her nausea vomiting also improved.  She did have symptoms of vertigo and she was started on meclizine with improvement.  She stable to be discharged home.  She was instructed not to drive while she may have vertigo.  Discharge Diagnoses:  Principal Problem:   AKI (acute kidney injury) (HCC) Active Problems:   COPD (chronic obstructive pulmonary disease) (HCC)   BARRETTS ESOPHAGUS   ADHD (attention deficit hyperactivity disorder)   Anemia, macrocytic   Hyperkalemia   Metabolic acidosis   Nausea & vomiting    Discharge Instructions  Discharge Instructions    Call MD for:  persistant dizziness or  light-headedness   Complete by: As directed    Diet - low sodium heart healthy   Complete by: As directed    Discharge instructions   Complete by: As directed    No driving while dizzy/vertigo occurring Keep hydrated Do not ever take losartan   Increase activity slowly   Complete by: As directed      Allergies as of 10/04/2019      Reactions   Topiramate Other (See Comments)   "shut my body down." "shut my body down."   Amlodipine Swelling   Tylenol [acetaminophen] Other (See Comments)   Elevated liver enzymes       Medication List    STOP taking these medications   losartan 100 MG tablet Commonly known as: COZAAR     TAKE these medications   atenolol 100 MG tablet Commonly known as: TENORMIN Take 1 tablet (100 mg total) by mouth 2 (two) times daily.   chlorpheniramine-HYDROcodone 10-8 MG/5ML Suer Commonly known as: TUSSIONEX Take 5 mLs by mouth every 12 (twelve) hours as needed for cough.   hydrALAZINE 100 MG tablet Commonly known as: APRESOLINE Take 1 tablet (100 mg total) by mouth 3 (three) times daily.   loratadine 10 MG tablet Commonly known as: CLARITIN Take 10 mg by mouth daily as needed for allergies.   meclizine 12.5 MG tablet Commonly known as: ANTIVERT Take 1 tablet (12.5 mg total) by mouth 3 (three) times daily.   omeprazole 20 MG capsule Commonly known as: PRILOSEC Take 1 capsule (20 mg total) by mouth daily.   sodium chloride 0.65 % Soln nasal spray Commonly known as: OCEAN Place 1 spray into both nostrils as needed for  congestion.   VITAMIN B 12 PO Take 3,000 mcg by mouth daily.   Vitamin D3 125 MCG (5000 UT) Caps Take 1 capsule by mouth daily.       Allergies  Allergen Reactions  . Topiramate Other (See Comments)    "shut my body down." "shut my body down."  . Amlodipine Swelling  . Tylenol [Acetaminophen] Other (See Comments)    Elevated liver enzymes     Consultations:  Nephrology   Procedures/Studies: US  Renal  Result Date: 10/12/2019 CLINICAL DATA:  Acute kidney injury EXAM: RENAL / URINARY TRACT ULTRASOUND COMPLETE COMPARISON:  None. FINDINGS: Right Kidney: Renal measurements: 9.8 x 4.9 x 4.9 cm = volume: 124 mL . Echogenicity within normal limits. No mass or hydronephrosis visualized. Left Kidney: Renal measurements: 9.3 x 4.7 x 4.8 cm = volume: 109 mL. Echogenicity within normal limits. No mass or hydronephrosis visualized. Bladder: Appears normal for degree of bladder distention. Other: None. IMPRESSION: Normal study Electronically Signed   By: Constance Holster M.D.   On: 2019-10-12 19:03   Dg Chest Portable 1 View  Result Date: 10/12/2019 CLINICAL DATA:  Chest pain, cough EXAM: PORTABLE CHEST 1 VIEW COMPARISON:  07/11/2018 FINDINGS: Cardiomegaly. Both lungs are clear. Nonacute fracture deformities of the left lateral ribs. IMPRESSION: Cardiomegaly without acute abnormality of the lungs in AP portable projection. Electronically Signed   By: Eddie Candle M.D.   On: 2019/10/12 13:01       Subjective:  Patient seen and examined.  She feels better.  Denies any shortness of breath, nausea, vomiting.  Reports meclizine has helped her vertigo which has improved.  Discharge Exam: Vitals:   10/04/19 0432 10/04/19 0750  BP: 135/86 (!) 144/81  Pulse: 74 66  Resp: 16 19  Temp: 98.3 F (36.8 C) 98 F (36.7 C)  SpO2: 92% 94%   Vitals:   10/03/19 1539 10/03/19 1948 10/04/19 0432 10/04/19 0750  BP: 126/71 (!) 148/93 135/86 (!) 144/81  Pulse: 73 75 74 66  Resp: 18 16 16 19   Temp: 98.7 F (37.1 C) 99.3 F (37.4 C) 98.3 F (36.8 C) 98 F (36.7 C)  TempSrc: Oral Oral Oral Oral  SpO2: 97% 96% 92% 94%  Weight:   70.8 kg   Height:        General: Pt is alert, awake, not in acute distress Cardiovascular: RRR, S1/S2 +, no rubs, no gallops Respiratory: CTA bilaterally, no wheezing, no rhonchi Abdominal: Soft, NT, ND, bowel sounds + Extremities: no edema, no cyanosis Neuro :alert  oriented x3 cranial nerves II to XII grossly intact    The results of significant diagnostics from this hospitalization (including imaging, microbiology, ancillary and laboratory) are listed below for reference.     Microbiology: Recent Results (from the past 240 hour(s))  SARS CORONAVIRUS 2 (TAT 6-24 HRS) Nasopharyngeal Nasopharyngeal Swab     Status: None   Collection Time: Oct 12, 2019  6:28 PM   Specimen: Nasopharyngeal Swab  Result Value Ref Range Status   SARS Coronavirus 2 NEGATIVE NEGATIVE Final    Comment: (NOTE) SARS-CoV-2 target nucleic acids are NOT DETECTED. The SARS-CoV-2 RNA is generally detectable in upper and lower respiratory specimens during the acute phase of infection. Negative results do not preclude SARS-CoV-2 infection, do not rule out co-infections with other pathogens, and should not be used as the sole basis for treatment or other patient management decisions. Negative results must be combined with clinical observations, patient history, and epidemiological information. The expected result is Negative. Fact  Sheet for Patients: HairSlick.nohttps://www.fda.gov/media/138098/download Fact Sheet for Healthcare Providers: quierodirigir.comhttps://www.fda.gov/media/138095/download This test is not yet approved or cleared by the [place] FDA and  has been authorized for detection and/or diagnosis of SARS-CoV-2 by FDA under an Emergency Use Authorization (EUA). This EUA will remain  in effect (meaning this test can be used) for the duration of the COVID-19 declaration under Section 56 4(b)(1) of the Act, 21 U.S.C. section 360bbb-3(b)(1), unless the authorization is terminated or revoked sooner. Performed at [facility], [address] [place], [place] [address]      Labs: BNP (last 3 results) No results for input(s): BNP in the last 8760 hours. Basic Metabolic Panel: Recent Labs  Lab 10/02/19 1321 10/02/19 1752 10/03/19 0543 10/04/19 0844  NA 126* 129* 132* 137  K  7.0* 4.7 4.5 4.1  CL 96* 97* 101 107  CO2 13* 14* 20* 20*  GLUCOSE 69* 77 86 139*  BUN 63* 58* 50* 35*  CREATININE 3.13* 2.29* 1.38* 0.87  CALCIUM 8.5* 8.5* 8.4* 8.7*   Liver Function Tests: Recent Labs  Lab 10/02/19 1321 10/03/19 0543  AST 31 20  ALT 22 17  ALKPHOS 77 62  BILITOT 0.6 1.0  PROT 7.7 6.7  ALBUMIN 3.8 3.5   No results for input(s): LIPASE, AMYLASE in the last 168 hours. No results for input(s): AMMONIA in the last 168 hours. CBC: Recent Labs  Lab 10/02/19 1321 10/03/19 0543  WBC 9.6 5.8  NEUTROABS 8.6*  --   HGB 11.1* 9.2*  HCT 31.5* 25.5*  MCV 101.6* 99.2  PLT 249 221   Cardiac Enzymes: No results for input(s): CKTOTAL, CKMB, CKMBINDEX, TROPONINI in the last 168 hours. BNP: Invalid input(s): POCBNP CBG: Recent Labs  Lab 10/02/19 1539 10/03/19 0749 10/03/19 1203 10/03/19 1713 10/04/19 0752  GLUCAP 142* 97 149* 104* 84   D-Dimer No results for input(s): DDIMER in the last 72 hours. Hgb A1c No results for input(s): HGBA1C in the last 72 hours. Lipid Profile No results for input(s): CHOL, HDL, LDLCALC, TRIG, CHOLHDL, LDLDIRECT in the last 72 hours. Thyroid function studies No results for input(s): TSH, T4TOTAL, T3FREE, THYROIDAB in the last 72 hours.  Invalid input(s): FREET3 Anemia work up No results for input(s): VITAMINB12, FOLATE, FERRITIN, TIBC, IRON, RETICCTPCT in the last 72 hours. Urinalysis    Component Value Date/Time   COLORURINE YELLOW (A) 10/02/2019 1321   APPEARANCEUR HAZY (A) 10/02/2019 1321   APPEARANCEUR Cloudy (A) 05/26/2019 1336   LABSPEC 1.009 10/02/2019 1321   PHURINE 5.0 10/02/2019 1321   GLUCOSEU NEGATIVE 10/02/2019 1321   HGBUR NEGATIVE 10/02/2019 1321   BILIRUBINUR NEGATIVE 10/02/2019 1321   BILIRUBINUR Negative 05/26/2019 1336   KETONESUR NEGATIVE 10/02/2019 1321   PROTEINUR NEGATIVE 10/02/2019 1321   NITRITE NEGATIVE 10/02/2019 1321   LEUKOCYTESUR SMALL (A) 10/02/2019 1321   Sepsis Labs Invalid  input(s): PROCALCITONIN,  WBC,  LACTICIDVEN Microbiology Recent Results (from the past 240 hour(s))  SARS CORONAVIRUS 2 (TAT 6-24 HRS) Nasopharyngeal Nasopharyngeal Swab     Status: None   Collection Time: 10/02/19  6:28 PM   Specimen: Nasopharyngeal Swab  Result Value Ref Range Status   SARS Coronavirus 2 NEGATIVE NEGATIVE Final    Comment: (NOTE) SARS-CoV-2 target nucleic acids are NOT DETECTED. The SARS-CoV-2 RNA is generally detectable in upper and lower respiratory specimens during the acute phase of infection. Negative results do not preclude SARS-CoV-2 infection, do not rule out co-infections with other pathogens, and should not be used as the  sole basis for treatment or other patient management decisions. Negative results must be combined with clinical observations, patient history, and epidemiological information. The expected result is Negative. Fact Sheet for Patients: HairSlick.no Fact Sheet for Healthcare Providers: quierodirigir.com This test is not yet approved or cleared by the [place] FDA and  has been authorized for detection and/or diagnosis of SARS-CoV-2 by FDA under an Emergency Use Authorization (EUA). This EUA will remain  in effect (meaning this test can be used) for the duration of the COVID-19 declaration under Section 56 4(b)(1) of the Act, 21 U.S.C. section 360bbb-3(b)(1), unless the authorization is terminated or revoked sooner. Performed at [facility], [address], [place], [place] [address]      Time coordinating discharge: Over 30 minutes  SIGNED:   Lynn Ito, MD  [facility] 10/04/2019, 2:08 PM Pager   If 7PM-7AM, please contact night-coverage [email or web address] Password [email or web address]

## 2019-10-04 NOTE — Plan of Care (Signed)
  Problem: Education: Goal: Knowledge of General Education information will improve Description: Including pain rating scale, medication(s)/side effects and non-pharmacologic comfort measures Outcome: Progressing   Problem: Clinical Measurements: Goal: Cardiovascular complication will be avoided Outcome: Progressing   Problem: Safety: Goal: Ability to remain free from injury will improve Outcome: Progressing   Problem: Skin Integrity: Goal: Risk for impaired skin integrity will decrease Outcome: Progressing   

## 2019-10-04 NOTE — Plan of Care (Signed)
  Problem: Clinical Measurements: Goal: Diagnostic test results will improve Outcome: Progressing   Problem: Nutrition: Goal: Adequate nutrition will be maintained Outcome: Progressing   Problem: Elimination: Goal: Will not experience complications related to urinary retention Outcome: Progressing   

## 2019-10-04 NOTE — Progress Notes (Signed)
[facility]  ROUNDING NOTE   Subjective:  Overall patient feeling much better. She states that she is no longer dizzy. Good urine output of 1.6 L over the preceding 24 hours. Creatinine now down to 0.87.   Objective:  Vital signs in last 24 hours:  Temp:  [98 F (36.7 C)-99.3 F (37.4 C)] 98 F (36.7 C) (11/29 0750) Pulse Rate:  [66-75] 66 (11/29 0750) Resp:  [16-19] 19 (11/29 0750) BP: (126-148)/(71-93) 144/81 (11/29 0750) SpO2:  [92 %-97 %] 94 % (11/29 0750) Weight:  [70.8 kg] 70.8 kg (11/29 0432)  Weight change: 2.722 kg Filed Weights   10/02/19 2009 10/03/19 0340 10/04/19 0432  Weight: 71.9 kg 70.9 kg 70.8 kg    Intake/Output: I/O last 3 completed shifts: In: 1769 [P.O.:120; I.V.:1649] Out: 2500 [Urine:2500]   Intake/Output this shift:  Total I/O In: 240 [P.O.:240] Out: 200 [Urine:200]  Physical Exam: General: No acute distress  Head: Normocephalic, atraumatic. Moist oral mucosal membranes  Eyes: Anicteric  Neck: Supple, trachea midline  Lungs:  Clear to auscultation, normal effort  Heart: S1S2 no rubs  Abdomen:  Soft, nontender, bowel sounds present  Extremities: no peripheral edema.  Neurologic: Awake, alert, following commands  Skin: No lesions  Access:     Basic Metabolic Panel: Recent Labs  Lab 10/02/19 1321 10/02/19 1752 10/03/19 0543 10/04/19 0844  NA 126* 129* 132* 137  K 7.0* 4.7 4.5 4.1  CL 96* 97* 101 107  CO2 13* 14* 20* 20*  GLUCOSE 69* 77 86 139*  BUN 63* 58* 50* 35*  CREATININE 3.13* 2.29* 1.38* 0.87  CALCIUM 8.5* 8.5* 8.4* 8.7*    Liver Function Tests: Recent Labs  Lab 10/02/19 1321 10/03/19 0543  AST 31 20  ALT 22 17  ALKPHOS 77 62  BILITOT 0.6 1.0  PROT 7.7 6.7  ALBUMIN 3.8 3.5   No results for input(s): LIPASE, AMYLASE in the last 168 hours. No results for input(s): AMMONIA in the last 168 hours.  CBC: Recent Labs  Lab 10/02/19 1321 10/03/19 0543  WBC 9.6 5.8  NEUTROABS 8.6*  --   HGB 11.1*  9.2*  HCT 31.5* 25.5*  MCV 101.6* 99.2  PLT 249 221    Cardiac Enzymes: No results for input(s): CKTOTAL, CKMB, CKMBINDEX, TROPONINI in the last 168 hours.  BNP: Invalid input(s): POCBNP  CBG: Recent Labs  Lab 10/02/19 1539 10/03/19 0749 10/03/19 1203 10/03/19 1713 10/04/19 0752  GLUCAP 142* 97 149* 104* 84    Microbiology: Results for orders placed or performed during the hospital encounter of 10/02/19  SARS CORONAVIRUS 2 (TAT 6-24 HRS) Nasopharyngeal Nasopharyngeal Swab     Status: None   Collection Time: 10/02/19  6:28 PM   Specimen: Nasopharyngeal Swab  Result Value Ref Range Status   SARS Coronavirus 2 NEGATIVE NEGATIVE Final    Comment: (NOTE) SARS-CoV-2 target nucleic acids are NOT DETECTED. The SARS-CoV-2 RNA is generally detectable in upper and lower respiratory specimens during the acute phase of infection. Negative results do not preclude SARS-CoV-2 infection, do not rule out co-infections with other pathogens, and should not be used as the sole basis for treatment or other patient management decisions. Negative results must be combined with clinical observations, patient history, and epidemiological information. The expected result is Negative. Fact Sheet for Patients: HairSlick.no Fact Sheet for Healthcare Providers: quierodirigir.com This test is not yet approved or cleared by the [place] FDA and  has been authorized for detection and/or diagnosis of SARS-CoV-2 by FDA  under an Emergency Use Authorization (EUA). This EUA will remain  in effect (meaning this test can be used) for the duration of the COVID-19 declaration under Section 56 4(b)(1) of the Act, 21 U.S.C. section 360bbb-3(b)(1), unless the authorization is terminated or revoked sooner. Performed at [facility], [place] [address], [place], [place] [address]     Coagulation Studies: No results for input(s): LABPROT, INR in the  last 72 hours.  Urinalysis: Recent Labs    10/02/19 1321  COLORURINE YELLOW*  LABSPEC 1.009  PHURINE 5.0  GLUCOSEU NEGATIVE  HGBUR NEGATIVE  BILIRUBINUR NEGATIVE  KETONESUR NEGATIVE  PROTEINUR NEGATIVE  NITRITE NEGATIVE  LEUKOCYTESUR SMALL*      Imaging: US Renal  Result Date: 10/02/2019 CLINICAL DATA:  Acute kidney injury EXAM: RENAL / URINARY TRACT ULTRASOUND COMPLETE COMPARISON:  None. FINDINGS: Right Kidney: Renal measurements: 9.8 x 4.9 x 4.9 cm = volume: 124 mL . Echogenicity within normal limits. No mass or hydronephrosis visualized. Left Kidney: Renal measurements: 9.3 x 4.7 x 4.8 cm = volume: 109 mL. Echogenicity within normal limits. No mass or hydronephrosis visualized. Bladder: Appears normal for degree of bladder distention. Other: None. IMPRESSION: Normal study Electronically Signed   By: Constance Holster M.D.   On: 10/02/2019 19:03   Dg Chest Portable 1 View  Result Date: 10/02/2019 CLINICAL DATA:  Chest pain, cough EXAM: PORTABLE CHEST 1 VIEW COMPARISON:  07/11/2018 FINDINGS: Cardiomegaly. Both lungs are clear. Nonacute fracture deformities of the left lateral ribs. IMPRESSION: Cardiomegaly without acute abnormality of the lungs in AP portable projection. Electronically Signed   By: Eddie Candle M.D.   On: 10/02/2019 13:01     Medications:   . sodium chloride 75 mL/hr at 10/03/19 2117   . atenolol  100 mg Oral BID  . heparin  5,000 Units Subcutaneous Q8H  . hydrALAZINE  100 mg Oral TID  . meclizine  12.5 mg Oral TID  . pantoprazole  40 mg Oral Daily  . pneumococcal 23 valent vaccine  0.5 mL Intramuscular Tomorrow-1000  . vitamin B-12  3,000 mcg Oral Daily   acetaminophen **OR** acetaminophen, chlorpheniramine-HYDROcodone, diazepam, ondansetron **OR** ondansetron (ZOFRAN) IV, senna-docusate, sodium chloride  Assessment/ Plan:  68 y.o. female with a PMHx of attention deficit disorder, autoimmune liver disease, Barrett's esophagus, COPD, depression, GERD,  hypertension, migraine headaches, peptic ulcer disease, who was admitted to [facility] on 10/02/2019 for evaluation of nausea and vomiting.  1.  Acute kidney injury with hyperkalemia.  Suspect that the patient developed volume depletion when she had nausea and vomiting.  Patient was also on losartan at home.   -Renal function has normalized.  Creatinine down to 0.87 with urine output of 1.6 L over the preceding 24 hours.  Continue to hold losartan.  We will go ahead and discontinue 0.9 normal saline.  2.  Hypertension.  Keep patient off of losartan.  Otherwise maintain the patient on current doses of atenolol and hydralazine.   LOS: 2 Mariena Meares 11/29/202011:57 AM

## 2019-10-05 ENCOUNTER — Telehealth: Payer: Self-pay

## 2019-10-05 NOTE — Telephone Encounter (Signed)
Received call from agent requesting on call provider for [facility] practice per request of the police.  Notified of Dr. Wynetta Emery and call conference in with the agent.

## 2019-10-05 NOTE — Telephone Encounter (Signed)
I have made the 1st attempt to contact the patient or family member in charge, in order to follow up from recently being discharged from the hospital. I left a message on voicemail but I will make another attempt at a different time.  

## 2019-10-06 DEATH — deceased
